# Patient Record
Sex: Male | Born: 1980 | Race: Black or African American | Hispanic: No | Marital: Single | Smoking: Current every day smoker
Health system: Southern US, Community
[De-identification: ages and names within clinical notes are randomized; demographics above are authoritative.]

## PROBLEM LIST (undated history)

## (undated) DIAGNOSIS — S069X9A Unspecified intracranial injury with loss of consciousness of unspecified duration, initial encounter: Secondary | ICD-10-CM

## (undated) DIAGNOSIS — R569 Unspecified convulsions: Secondary | ICD-10-CM

## (undated) DIAGNOSIS — S069XAA Unspecified intracranial injury with loss of consciousness status unknown, initial encounter: Secondary | ICD-10-CM

## (undated) DIAGNOSIS — I10 Essential (primary) hypertension: Secondary | ICD-10-CM

## (undated) DIAGNOSIS — F17200 Nicotine dependence, unspecified, uncomplicated: Secondary | ICD-10-CM

## (undated) HISTORY — DX: Unspecified intracranial injury with loss of consciousness status unknown, initial encounter: S06.9XAA

## (undated) HISTORY — DX: Nicotine dependence, unspecified, uncomplicated: F17.200

## (undated) HISTORY — DX: Unspecified intracranial injury with loss of consciousness of unspecified duration, initial encounter: S06.9X9A

## (undated) HISTORY — PX: OTHER SURGICAL HISTORY: SHX169

## (undated) HISTORY — DX: Unspecified convulsions: R56.9

---
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
## 2003-12-01 ENCOUNTER — Emergency Department (HOSPITAL_COMMUNITY): Admission: EM | Admit: 2003-12-01 | Discharge: 2003-12-01 | Payer: Self-pay

## 2004-12-07 ENCOUNTER — Emergency Department (HOSPITAL_COMMUNITY): Admission: EM | Admit: 2004-12-07 | Discharge: 2004-12-07 | Payer: Self-pay | Admitting: Emergency Medicine

## 2006-06-16 ENCOUNTER — Emergency Department (HOSPITAL_COMMUNITY): Admission: EM | Admit: 2006-06-16 | Discharge: 2006-06-16 | Payer: Self-pay | Admitting: Emergency Medicine

## 2006-08-03 ENCOUNTER — Emergency Department (HOSPITAL_COMMUNITY): Admission: EM | Admit: 2006-08-03 | Discharge: 2006-08-04 | Payer: Self-pay | Admitting: Emergency Medicine

## 2006-10-18 ENCOUNTER — Emergency Department (HOSPITAL_COMMUNITY): Admission: EM | Admit: 2006-10-18 | Discharge: 2006-10-18 | Payer: Self-pay | Admitting: Family Medicine

## 2007-07-28 ENCOUNTER — Emergency Department (HOSPITAL_COMMUNITY): Admission: EM | Admit: 2007-07-28 | Discharge: 2007-07-28 | Payer: Self-pay | Admitting: Family Medicine

## 2007-08-02 ENCOUNTER — Ambulatory Visit (HOSPITAL_COMMUNITY): Admission: RE | Admit: 2007-08-02 | Discharge: 2007-08-02 | Payer: Self-pay | Admitting: Orthopedic Surgery

## 2007-11-23 ENCOUNTER — Emergency Department (HOSPITAL_COMMUNITY): Admission: EM | Admit: 2007-11-23 | Discharge: 2007-11-23 | Payer: Self-pay | Admitting: Family Medicine

## 2010-01-01 ENCOUNTER — Emergency Department (HOSPITAL_COMMUNITY): Admission: EM | Admit: 2010-01-01 | Discharge: 2010-01-01 | Payer: Self-pay | Admitting: Emergency Medicine

## 2010-09-18 LAB — POCT URINALYSIS DIP (DEVICE)
Bilirubin Urine: NEGATIVE
Glucose, UA: NEGATIVE mg/dL
Hgb urine dipstick: NEGATIVE
Ketones, ur: NEGATIVE mg/dL
Nitrite: NEGATIVE
Protein, ur: NEGATIVE mg/dL
Specific Gravity, Urine: 1.025 (ref 1.005–1.030)
Urobilinogen, UA: 0.2 mg/dL (ref 0.0–1.0)
pH: 6 (ref 5.0–8.0)

## 2011-12-03 ENCOUNTER — Encounter (HOSPITAL_COMMUNITY): Payer: Self-pay | Admitting: Emergency Medicine

## 2011-12-03 ENCOUNTER — Emergency Department (HOSPITAL_COMMUNITY)
Admission: EM | Admit: 2011-12-03 | Discharge: 2011-12-03 | Disposition: A | Discharge status: Home / Self Care | Payer: Self-pay | Source: Home / Self Care | Attending: Family Medicine | Admitting: Family Medicine

## 2011-12-03 DIAGNOSIS — I10 Essential (primary) hypertension: Secondary | ICD-10-CM

## 2011-12-03 HISTORY — DX: Essential (primary) hypertension: I10

## 2011-12-03 LAB — POCT URINALYSIS DIP (DEVICE)
Glucose, UA: NEGATIVE mg/dL
Nitrite: NEGATIVE
Urobilinogen, UA: 0.2 mg/dL (ref 0.0–1.0)

## 2011-12-03 LAB — POCT I-STAT, CHEM 8
HCT: 41 % (ref 39.0–52.0)
Hemoglobin: 13.9 g/dL (ref 13.0–17.0)
Potassium: 4.2 mEq/L (ref 3.5–5.1)
Sodium: 138 mEq/L (ref 135–145)

## 2011-12-03 MED ORDER — TRIAMTERENE-HCTZ 37.5-25 MG PO TABS: 1.0000 | ORAL_TABLET | Freq: Every day | ORAL | Status: DC

## 2011-12-03 NOTE — Discharge Instructions (Signed)
You need to find a primary care provider who can monitor your blood pressure, adjust her medication accordingly and refill your medications. See the list below for community resources. Take the prescribed medication as instructed. You will need to have your blood pressure rechecked in 2 weeks. Go to the emergency department if persistent worsening headache or any new symptoms like chest pain, face drop, and difficulty moving your arms or legs, making or understanding speech, problems with gait, balance or vision.  Go to www.goodrx.com to look up your medications. This will give you a list of where you can find your prescriptions at the most affordable prices.   Call [facility]  [phone]  If you have no primary doctor, here are some resources that may be helpful:  Medicaid-accepting [facility] Providers:   - [facility]- [address]      [phone]      Mon-Fri 9am-7pm, Sat 9am-1pm   - [facility]- [address] [place]      [phone]   - [facility]- [address], [address] [place]      [phone]   [facility]- [address]      [phone]   - Renaye Rakers- [address] [place], [address]      [phone]      Only accepts [place] patients       after they have her name applied to their card   Self Pay (no insurance) in [place]:   - Sickle Cell Patients: Dr Willey Blade, [facility]      [address] [place]      [phone]   - [facility] [phone]   - Physician Referral Service- [phone]   - [facility]- [address] [place]      [phone]   [facility] [place]- [address]   - [facility]- see information above      (Speak to [employer] if you do not have insurance)   - [facility]- [address] [place]      [phone]   - [facility] [place]- [address]      [phone]   -  [facility]- [address]      [phone]   - Dr Julio Sicks-  [address], [address], [place]      [phone]   - [facility]- [address]      [phone]   - [facility]- [address]      [phone]      Also [address]      [phone]   - [facility]- [address]      [phone]      1st and 3rd Saturday every month, 10am-1pm    Other agencies that provide inexpensive medical care:     [facility]  [phone]    [facility]  [phone]    [facility]  [phone]    [facility]  [phone] [address] [place] [address]    [facility]  [phone]    [facility]  [phone] [facility] [phone]   [address] [place], [place] [address]  Chronic Pain Problems Contact  [facility]  [phone] Patients need to be referred by their primary care doctor.  [facility]  [facility]     [employer]                          [facility]. [address] [place]                       [address]      [address] [place] [address]   [phone] (After Hours)  General Information: Finding a doctor when you do not have health insurance can be tricky. Although you are not limited by an insurance plan, you are of course limited by her finances and how much but he can pay out of pocket.  What are your options if you don't have health insurance?   1) Find a Librarian, academic and Pay Out of Pocket Although you won't have to find out who is covered by your insurance plan, it is a good idea to ask around and get recommendations. You will then need to call the office and see if the doctor you have chosen will accept you as a new patient and what types of options they offer for patients who are self-pay. Some doctors offer discounts or will set up  payment plans for their patients who do not have insurance, but you will need to ask so you aren't surprised when you get to your appointment.  2) Contact Your Local Health Department Not all health departments have doctors that can see patients for sick visits, but many do, so it is worth a call to see if yours does. If you don't know where your local health department is, you can check in your phone book. The CDC also has a tool to help you locate your state's health department, and many state websites also have listings of all of their local health departments.  3) Find a Walk-in Clinic If your illness is not likely to be very severe or complicated, you may want to try a walk in clinic. These are popping up all over the country in pharmacies, drugstores, and shopping centers. They're usually staffed by nurse practitioners or physician assistants that have been trained to treat common illnesses and complaints. They're usually fairly quick and inexpensive. However, if you have serious medical issues or chronic medical problems, these are probably not your best option     [facility]: [address] [place] [address] [phone]  [facility]: [address] [place] [address]   [phone]  [facility]: [address] [place] [address] [phone]  [facility] : [address] [place] [address] [phone]  [facility]: [address] [place] [address] [phone]  [facility]: [address] [place] [address] [phone]  Dr. Oneal Grout [address] [facility] [place] [address] [phone]

## 2011-12-03 NOTE — ED Notes (Signed)
Intermittent headache for four days.  Denies uri symptoms.  Reports in jail may 1 - may 31.  While there, they diagnosed htn and started patient on blood pressure medicine.  Once out of jail, no longer had medication.  Patient not sure what med he took in jail-diuretic.

## 2011-12-03 NOTE — ED Provider Notes (Signed)
History     CSN: [id number]  Arrival date & time 12/03/11  1140   First MD Initiated Contact with Patient 12/03/11 1152      Chief Complaint  Patient presents with  . Headache    (Consider location/radiation/quality/duration/timing/severity/associated sxs/prior treatment) HPI Comments: 31 year old smoker male with history of high blood pressure diagnosed while he was in jail last month. Comes complaining of intermittent headaches for 3 days. Reports he's been out of his blood pressure medication for the last 3 days. Denies visual changes. Denies gait or balance problems. No weakness, numbness, or paresthesias. Denies chest pain or leg swelling. No PND. No hematuria. No abdominal pain. No nausea or vomiting. No fever or chills.   Past Medical History  Diagnosis Date  . Hypertension   . Asthma     History reviewed. No pertinent past surgical history.  Family History  Problem Relation Age of Onset  . Hypertension Mother   . Hypertension Other     History  Substance Use Topics  . Smoking status: Current Everyday Smoker  . Smokeless tobacco: Not on file  . Alcohol Use: Yes      Review of Systems  Constitutional: Negative for fever, chills, diaphoresis and fatigue.  Eyes: Negative for visual disturbance.  Respiratory: Negative for cough and shortness of breath.   Cardiovascular: Negative for chest pain, palpitations and leg swelling.  Gastrointestinal: Negative for nausea, vomiting, abdominal pain and diarrhea.  Genitourinary: Negative for dysuria, urgency, frequency, hematuria, flank pain and discharge.  Musculoskeletal: Negative for back pain, joint swelling and arthralgias.  Neurological: Positive for headaches. Negative for tremors, seizures, syncope, speech difficulty, weakness and numbness.    Allergies  Review of patient's allergies indicates no known allergies.  Home Medications   Current Outpatient Rx  Name Route Sig Dispense Refill  . TRIAMTERENE-HCTZ  37.5-25 MG PO TABS Oral Take 1 each (1 tablet total) by mouth daily. 30 tablet 1    BP 167/102  Pulse 60  Temp(Src) 98.8 F (37.1 C) (Oral)  Resp 18  SpO2 100%  Physical Exam  Constitutional: He is oriented to person, place, and time. He appears well-developed and well-nourished. No distress.  Cardiovascular: Normal rate, regular rhythm and normal heart sounds.  Exam reveals no gallop and no friction rub.   No murmur heard. Pulmonary/Chest: Effort normal and breath sounds normal. No respiratory distress. He has no wheezes. He has no rales. He exhibits no tenderness.  Abdominal: Soft. Bowel sounds are normal. He exhibits no distension and no mass. There is no tenderness.  Neurological: He is alert and oriented to person, place, and time.  Skin: No rash noted.       Skin of scalp left parietal soft thick redundant. 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 Hair is breaded to cover skin deformity.  Hair breads look tight and causing traction. No skin erythema or brakes.    ED Course  Procedures (including critical care time)  Labs Reviewed  POCT URINALYSIS DIP (DEVICE) - Abnormal; Notable for the following:    Leukocytes, UA TRACE (*) Biochemical Testing Only. Please order routine urinalysis from main lab if confirmatory testing is needed.   All other components within normal limits   No results found.   1. Hypertension       MDM  31 year old male with history of hypertension of his medications here with intermittent headache. Does not remember the name of his blood pressure medication. No protein or blood in poc urine.  Prescribed triamterene to take 1/2 tab daily until finding a PCP. Reccommended to have BP rechecked in 1-2 weeks.        Sharin Grave, MD 12/04/11 [phone]

## 2014-05-31 ENCOUNTER — Encounter (HOSPITAL_COMMUNITY): Payer: Self-pay

## 2014-05-31 ENCOUNTER — Emergency Department (HOSPITAL_COMMUNITY): Payer: No Typology Code available for payment source

## 2014-05-31 ENCOUNTER — Emergency Department (HOSPITAL_COMMUNITY)
Admission: EM | Admit: 2014-05-31 | Discharge: 2014-05-31 | Disposition: A | Discharge status: Home / Self Care | Payer: No Typology Code available for payment source | Attending: Emergency Medicine | Admitting: Emergency Medicine

## 2014-05-31 DIAGNOSIS — Z79899 Other long term (current) drug therapy: Secondary | ICD-10-CM | POA: Insufficient documentation

## 2014-05-31 DIAGNOSIS — Y92410 Unspecified street and highway as the place of occurrence of the external cause: Secondary | ICD-10-CM | POA: Insufficient documentation

## 2014-05-31 DIAGNOSIS — S8992XA Unspecified injury of left lower leg, initial encounter: Secondary | ICD-10-CM | POA: Insufficient documentation

## 2014-05-31 DIAGNOSIS — J45909 Unspecified asthma, uncomplicated: Secondary | ICD-10-CM | POA: Insufficient documentation

## 2014-05-31 DIAGNOSIS — Y9389 Activity, other specified: Secondary | ICD-10-CM | POA: Insufficient documentation

## 2014-05-31 DIAGNOSIS — M25562 Pain in left knee: Secondary | ICD-10-CM

## 2014-05-31 DIAGNOSIS — Y998 Other external cause status: Secondary | ICD-10-CM | POA: Diagnosis not present

## 2014-05-31 DIAGNOSIS — Z720 Tobacco use: Secondary | ICD-10-CM | POA: Diagnosis not present

## 2014-05-31 DIAGNOSIS — S3992XA Unspecified injury of lower back, initial encounter: Secondary | ICD-10-CM | POA: Insufficient documentation

## 2014-05-31 DIAGNOSIS — I10 Essential (primary) hypertension: Secondary | ICD-10-CM | POA: Diagnosis not present

## 2014-05-31 DIAGNOSIS — R52 Pain, unspecified: Secondary | ICD-10-CM

## 2014-05-31 MED ORDER — METHOCARBAMOL 500 MG PO TABS
500.0000 mg | ORAL_TABLET | Freq: Two times a day (BID) | ORAL | Status: DC
Start: 2014-05-31 — End: 2016-09-19

## 2014-05-31 MED ORDER — IBUPROFEN 800 MG PO TABS: 800.0000 mg | ORAL_TABLET | Freq: Three times a day (TID) | ORAL | Status: DC
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
## 2014-05-31 NOTE — Discharge Instructions (Signed)
1. Medications: robaxin, ibuprofen, usual home medications 2. Treatment: rest, drink plenty of fluids, gentle stretching as discussed, alternate ice and heat 3. Follow Up: Please followup with your primary doctor in 3 days for discussion of your diagnoses and further evaluation after today's visit; if you do not have a primary care doctor use the resource guide provided to find one;  Return to the ER for worsening back pain, difficulty walking, loss of bowel or bladder control or other concerning symptoms    Arthralgia Your caregiver has diagnosed you as suffering from an arthralgia. Arthralgia means there is pain in a joint. This can come from many reasons including:  Bruising the joint which causes soreness (inflammation) in the joint.  Wear and tear on the joints which occur as we grow older (osteoarthritis).  Overusing the joint.  Various forms of arthritis.  Infections of the joint. Regardless of the cause of pain in your joint, most of these different pains respond to anti-inflammatory drugs and rest. The exception to this is when a joint is infected, and these cases are treated with antibiotics, if it is a bacterial infection. HOME CARE INSTRUCTIONS   Rest the injured area for as long as directed by your caregiver. Then slowly start using the joint as directed by your caregiver and as the pain allows. Crutches as directed may be useful if the ankles, knees or hips are involved. If the knee was splinted or casted, continue use and care as directed. If an stretchy or elastic wrapping bandage has been applied today, it should be removed and re-applied every 3 to 4 hours. It should not be applied tightly, but firmly enough to keep swelling down. Watch toes and feet for swelling, bluish discoloration, coldness, numbness or excessive pain. If any of these problems (symptoms) occur, remove the ace bandage and re-apply more loosely. If these symptoms persist, contact your caregiver or return to  this location.  For the first 24 hours, keep the injured extremity elevated on pillows while lying down.  Apply ice for 15-20 minutes to the sore joint every couple hours while awake for the first half day. Then 03-04 times per day for the first 48 hours. Put the ice in a plastic bag and place a towel between the bag of ice and your skin.  Wear any splinting, casting, elastic bandage applications, or slings as instructed.  Only take over-the-counter or prescription medicines for pain, discomfort, or fever as directed by your caregiver. Do not use aspirin immediately after the injury unless instructed by your physician. Aspirin can cause increased bleeding and bruising of the tissues.  If you were given crutches, continue to use them as instructed and do not resume weight bearing on the sore joint until instructed. Persistent pain and inability to use the sore joint as directed for more than 2 to 3 days are warning signs indicating that you should see a caregiver for a follow-up visit as soon as possible. Initially, a hairline fracture (break in bone) may not be evident on X-rays. Persistent pain and swelling indicate that further evaluation, non-weight bearing or use of the joint (use of crutches or slings as instructed), or further X-rays are indicated. X-rays may sometimes not show a small fracture until a week or 10 days later. Make a follow-up appointment with your own caregiver or one to whom we have referred you. A radiologist (specialist in reading X-rays) may read your X-rays. Make sure you know how you are to obtain your X-ray results.  Do not assume everything is normal if you do not hear from us. SEEK MEDICAL CARE IF: Bruising, swelling, or pain increases. SEEK IMMEDIATE MEDICAL CARE IF:   Your fingers or toes are numb or blue.  The pain is not responding to medications and continues to stay the same or get worse.  The pain in your joint becomes severe.  You develop a fever over 102  F (38.9 C).  It becomes impossible to move or use the joint. MAKE SURE YOU:   Understand these instructions.  Will watch your condition.  Will get help right away if you are not doing well or get worse. Document Released: 06/19/2005 Document Revised: 09/11/2011 Document Reviewed: 02/05/2008 [facility] Patient Information 2015 [employer] [place]. This information is not intended to replace advice given to you by your health care provider. Make sure you discuss any questions you have with your health care provider.
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
## 2015-10-29 ENCOUNTER — Encounter (HOSPITAL_COMMUNITY): Payer: Self-pay | Admitting: Emergency Medicine

## 2015-10-29 ENCOUNTER — Emergency Department (HOSPITAL_COMMUNITY)
Admission: EM | Admit: 2015-10-29 | Discharge: 2015-10-29 | Disposition: A | Discharge status: Home / Self Care | Payer: Self-pay | Attending: Emergency Medicine | Admitting: Emergency Medicine

## 2015-10-29 ENCOUNTER — Emergency Department (HOSPITAL_COMMUNITY): Payer: No Typology Code available for payment source

## 2015-10-29 DIAGNOSIS — F17200 Nicotine dependence, unspecified, uncomplicated: Secondary | ICD-10-CM | POA: Insufficient documentation

## 2015-10-29 DIAGNOSIS — J209 Acute bronchitis, unspecified: Secondary | ICD-10-CM | POA: Insufficient documentation

## 2015-10-29 DIAGNOSIS — I10 Essential (primary) hypertension: Secondary | ICD-10-CM | POA: Insufficient documentation

## 2015-10-29 DIAGNOSIS — J40 Bronchitis, not specified as acute or chronic: Secondary | ICD-10-CM

## 2015-10-29 DIAGNOSIS — J45901 Unspecified asthma with (acute) exacerbation: Secondary | ICD-10-CM | POA: Insufficient documentation

## 2015-10-29 DIAGNOSIS — Z79899 Other long term (current) drug therapy: Secondary | ICD-10-CM | POA: Insufficient documentation

## 2015-10-29 LAB — CBC WITH DIFFERENTIAL/PLATELET
Basophils Absolute: 0 10*3/uL (ref 0.0–0.1)
Basophils Relative: 0 %
EOS ABS: 0.3 10*3/uL (ref 0.0–0.7)
Eosinophils Relative: 3 %
HCT: 43.6 % (ref 39.0–52.0)
HEMOGLOBIN: 14.8 g/dL (ref 13.0–17.0)
LYMPHS ABS: 2.1 10*3/uL (ref 0.7–4.0)
LYMPHS PCT: 25 %
MCH: 32.7 pg (ref 26.0–34.0)
MCHC: 33.9 g/dL (ref 30.0–36.0)
MCV: 96.2 fL (ref 78.0–100.0)
Monocytes Absolute: 0.8 10*3/uL (ref 0.1–1.0)
Monocytes Relative: 10 %
NEUTROS PCT: 62 %
Neutro Abs: 5.3 10*3/uL (ref 1.7–7.7)
Platelets: 239 10*3/uL (ref 150–400)
RBC: 4.53 MIL/uL (ref 4.22–5.81)
RDW: 12.3 % (ref 11.5–15.5)
WBC: 8.5 10*3/uL (ref 4.0–10.5)

## 2015-10-29 LAB — BASIC METABOLIC PANEL
Anion gap: 13 (ref 5–15)
BUN: 11 mg/dL (ref 6–20)
CHLORIDE: 104 mmol/L (ref 101–111)
CO2: 22 mmol/L (ref 22–32)
Calcium: 9.4 mg/dL (ref 8.9–10.3)
Creatinine, Ser: 1.43 mg/dL — ABNORMAL HIGH (ref 0.61–1.24)
GFR calc non Af Amer: >60 mL/min (ref >60)
Glucose, Bld: 166 mg/dL — ABNORMAL HIGH (ref 65–99)
POTASSIUM: 3.1 mmol/L — AB (ref 3.5–5.1)
SODIUM: 139 mmol/L (ref 135–145)

## 2015-10-29 MED ORDER — IPRATROPIUM-ALBUTEROL 0.5-2.5 (3) MG/3ML IN SOLN
3.0000 mL | Freq: Once | RESPIRATORY_TRACT | Status: AC
  Administered 2015-10-29: 3 mL via RESPIRATORY_TRACT
  Filled 2015-10-29: qty 3

## 2015-10-29 MED ORDER — AZITHROMYCIN 250 MG PO TABS
250.0000 mg | ORAL_TABLET | Freq: Every day | ORAL | Status: DC
Start: 2015-10-29 — End: 2016-09-19

## 2015-10-29 MED ORDER — SODIUM CHLORIDE 0.9 % IV BOLUS (SEPSIS)
1000.0000 mL | Freq: Once | INTRAVENOUS | Status: AC
  Administered 2015-10-29: 1000 mL via INTRAVENOUS

## 2015-10-29 MED ORDER — ALBUTEROL SULFATE HFA 108 (90 BASE) MCG/ACT IN AERS: 2.0000 | INHALATION_SPRAY | RESPIRATORY_TRACT | Status: DC | PRN

## 2015-10-29 MED ORDER — ALBUTEROL SULFATE (2.5 MG/3ML) 0.083% IN NEBU
5.0000 mg | INHALATION_SOLUTION | Freq: Once | RESPIRATORY_TRACT | Status: AC
  Administered 2015-10-29: 5 mg via RESPIRATORY_TRACT

## 2015-10-29 MED ORDER — METHYLPREDNISOLONE SODIUM SUCC 125 MG IJ SOLR
125.0000 mg | Freq: Once | INTRAMUSCULAR | Status: AC
  Administered 2015-10-29: 125 mg via INTRAVENOUS
  Filled 2015-10-29: qty 2

## 2015-10-29 MED ORDER — ALBUTEROL SULFATE (2.5 MG/3ML) 0.083% IN NEBU
INHALATION_SOLUTION | RESPIRATORY_TRACT | Status: AC
  Filled 2015-10-29: qty 6

## 2015-10-29 MED ORDER — BENZONATATE 100 MG PO CAPS: 100.0000 mg | ORAL_CAPSULE | Freq: Three times a day (TID) | ORAL | Status: DC

## 2015-10-29 MED ORDER — ALBUTEROL SULFATE HFA 108 (90 BASE) MCG/ACT IN AERS
2.0000 | INHALATION_SPRAY | Freq: Once | RESPIRATORY_TRACT | Status: AC
  Administered 2015-10-29: 2 via RESPIRATORY_TRACT
  Filled 2015-10-29: qty 6.7

## 2015-10-29 NOTE — Discharge Instructions (Signed)
You have been seen today for an asthma exacerbation. Your lab tests showed no abnormalities. Your x-ray shows evidence of bronchitis, which is commonly a virus and does not need antibiotics, however, due to her asthma history and the duration of symptoms, antibiotics are indicated. Please take all of your antibiotics until finished! You may develop abdominal discomfort or diarrhea from the antibiotic.  You may help offset this with probiotics which you can buy or get in yogurt. Do not eat or take the probiotics until 2 hours after your antibiotic. The x-ray also showed a small nodule in the bottom of your left lung. You should select and follow up with a primary care provider who you can see regularly. The [facility] and [facility] can set you up with a PCP. You are highly encouraged to stop smoking. Return to ED should symptoms worsen.  RESOURCE GUIDE  Chronic Pain Problems: Contact [facility]  [phone] Patients need to be referred by their primary care doctor.  Insufficient Money for Medicine: Contact United Way:  call "211" or [facility] [phone].  No Primary Care Doctor: - Call Health Connect  [phone] - can help you locate a primary care doctor that  accepts your insurance, provides certain services, etc. - Physician Referral Service- [phone]  Agencies that provide inexpensive medical care: - [facility]  [phone] - [facility]  [phone] - [facility]  [phone] - Women's Clinic  [phone] - Planned Parenthood  [phone] [facility]  [phone]  Medicaid-accepting [facility] Providers: - [facility]- [address]  [phone], Mon-Fri 9am-7pm, Sat 9am-1pm - [facility]- [address] [place], [address] [place]  [phone] - [facility]- [address] [place]  [phone] [facility]  [facility]- [address]  [phone] - Renaye Rakers- [address] [place], [address], [phone]  Only accepts [place] patients after they have their name  applied to their card  Self Pay (no insurance) in [place]: - Sickle Cell Patients: Dr Willey Blade, [facility]  [address] [place], [phone] - [facility]- [address] [place]  [phone]       [facility] [place]- [address]       -     [facility]- see information above (Speak to [employer] if you do not have insurance)       -  [facility]- [address] [place], [phone]       -  [facility] [place] [facility]- [address],  [phone]       -  [facility]- [address], [phone]       -  Dr Julio Sicks-  [address], [place], [phone]       -  [facility]- [address], [phone]       -  [facility]- [address] [phone], also [address], [phone]       -    [facility]- [address] [place], [phone], 1st & 3rd Saturday   every month, 10am-1pm  1) Find a Doctor and Pay Out of Pocket Although you won't have to find out who is covered by your insurance plan, it  is a good idea to ask around and get recommendations. You will then need to call the office and see if the doctor you have chosen will accept you as a new patient and what types of options they offer for patients who are self-pay. Some doctors offer discounts or will set up payment plans for their patients who do not have insurance, but you will need to ask so you aren't surprised when you get to your appointment.  2) Contact Your Local Health Department Not all health departments have doctors that can see patients for sick visits, but many do, so it is worth a call to see if yours does. If you don't know where your local health department is, you can check in your phone book. The CDC also has a tool to  help you locate your state's health department, and many state websites also have listings of all of their local health departments.  3) Find a Walk-in Clinic If your illness is not likely to be very severe or complicated, you may want to try a walk in clinic. These are popping up all over the country in pharmacies, drugstores, and shopping centers. They're usually staffed by nurse practitioners or physician assistants that have been trained to treat common illnesses and complaints. They're usually fairly quick and inexpensive. However, if you have serious medical issues or chronic medical problems, these are probably not your best option  STD Testing - [facility] [place], STD Clinic, [address], [place], phone [phone] or [phone].  Monday - Friday, call for an appointment. [facility]- [place] [facility] of [facility] [place], STD Clinic, [place], [place], phone [phone] or [phone].  Monday - Friday, call for an appointment.  Abuse/Neglect: [facility]- [place] Child Abuse Hotline (669)815-9543(336) 431-878-2487 [facility]- [place] Child Abuse Hotline 936-506-13168073711068 (After Hours)  Emergency Shelter:  [facility] [phone](336) [phone]  Maternity Homes: - [facility] [phone](336) [phone] - [facility] [phone]  MRSA Hotline #:   219-265-9090505-347-1638  [facility] Resources  [facility]  United Way [facility] [address] [place].                 [address]         [address]  [place]                                               [place]                              [place] Phone:  [phone]                                  Phone:  [phone]                   Phone:  [phone]  [facility], [phone] - [facility] Services - [phone]- [phone]       -     [facility] in [place], [address],  [phone], [place] [phone] or [phone] (After Hours)   [place]  Substance Abuse Resources: - Alcohol and Drug Services  [phone] - [place] [phone] - The [facility] [place] [phone] - Residential & Outpatient Substance Abuse Program  [phone]  Psychological Services: - [place], [place] [address], [place], [place]: [phone] or [phone], [employer].uy  Dental Assistance  If unable to pay or uninsured, contact:  [facility] or [facility]. to become qualified for the adult dental clinic.  Patients with Medicaid: [facility] [phone] [address], [place] [address] [phone]  If unable to pay, or uninsured, contact [facility] [phone]) or [facility] [phone] in [place], [place] in [facility]) to become qualified for the adult dental clinic   Other [place] [place], [place], [place], [address], [place], [place], 2nd and 4th Thursday of the month at 6:30am.  10 clients each day by appointment, can sometimes see walk-in patients if someone does not show for an appointment. [facility]- [address] [place], [place], [address], [place], [place], [place], [address], [place] Department- [place] Department- [place] Department- [phone]

## 2015-10-29 NOTE — ED Notes (Signed)
Pt verbalized understanding of d/c instructions, prescriptions, and follow-up care. No further questions/concerns, VSS, ambulatory w/ steady gait (refused wheelchair) 

## 2015-10-29 NOTE — ED Notes (Signed)
Per pts family he has been congested and having cough like symptoms for the last week today had worsening congestion and difficultly breathing. Pt has history of asthma. pts lung sounds- expiratory wheezing throughout. Pt is diaphoretic.

## 2015-10-29 NOTE — ED Provider Notes (Signed)
CSN: [id number]     Arrival date & time 10/29/15  1413 History   First MD Initiated Contact with Patient 10/29/15 1531     Chief Complaint  Patient presents with  . Asthma     (Consider location/radiation/quality/duration/timing/severity/associated sxs/prior Treatment) HPI   [name] is a 35 y.o. male, with a history of asthma and hypertension, presenting to the ED with Productive cough with clear sputum and congestion for the last 2 weeks. Patient states that he came to the ED because while he was at work today he began to have an episode of shortness of breath. His home albuterol inhaler improved his breathing somewhat, but patient still wanted to get checked out. Patient is an active smoker, smoking 1-1/2 to 2 packs per day. Patient denies fever/chills, nausea/vomiting, chest pain, current shortness of breath, or any other complaints.    Past Medical History  Diagnosis Date  . Hypertension   . Asthma    History reviewed. No pertinent past surgical history. Family History  Problem Relation Age of Onset  . Hypertension Mother   . Hypertension Other    Social History  Substance Use Topics  . Smoking status: Current Every Day Smoker  . Smokeless tobacco: None  . Alcohol Use: Yes    Review of Systems  Constitutional: Negative for fever and chills.  HENT: Positive for congestion.   Respiratory: Positive for cough and shortness of breath (resolved).   Cardiovascular: Negative for chest pain.  Gastrointestinal: Negative for nausea and vomiting.  Neurological: Negative for headaches.  All other systems reviewed and are negative.     Allergies  Review of patient's allergies indicates no known allergies.  Home Medications   Prior to Admission medications   Medication Sig Start Date End Date Taking? Authorizing Provider  albuterol (PROVENTIL HFA;VENTOLIN HFA) 108 (90 Base) MCG/ACT inhaler Inhale 1 puff into the lungs every 6 (six) hours as needed for wheezing or  shortness of breath.   Yes Historical Provider, MD  guaiFENesin (MUCINEX) 600 MG 12 hr tablet Take 600 mg by mouth 2 (two) times daily as needed for to loosen phlegm.   Yes Historical Provider, MD  ibuprofen (ADVIL,MOTRIN) 200 MG tablet Take 200 mg by mouth every 6 (six) hours as needed.   Yes Historical Provider, MD  albuterol (PROVENTIL HFA;VENTOLIN HFA) 108 (90 Base) MCG/ACT inhaler Inhale 2 puffs into the lungs every 4 (four) hours as needed for wheezing or shortness of breath. 10/29/15   Shawn C Joy, PA-C  azithromycin (ZITHROMAX) 250 MG tablet Take 1 tablet (250 mg total) by mouth daily. Take first 2 tablets together, then 1 every day until finished. 10/29/15   Shawn C Joy, PA-C  benzonatate (TESSALON) 100 MG capsule Take 1 capsule (100 mg total) by mouth every 8 (eight) hours. 10/29/15   Shawn C Joy, PA-C  ibuprofen (ADVIL,MOTRIN) 800 MG tablet Take 1 tablet (800 mg total) by mouth 3 (three) times daily. Patient not taking: Reported on 10/29/2015 05/31/14   Dahlia ClientHannah Muthersbaugh, PA-C  methocarbamol (ROBAXIN) 500 MG tablet Take 1 tablet (500 mg total) by mouth 2 (two) times daily. Patient not taking: Reported on 10/29/2015 05/31/14   Dahlia ClientHannah Muthersbaugh, PA-C  triamterene-hydrochlorothiazide (MAXZIDE-25) 37.5-25 MG per tablet Take 1 each (1 tablet total) by mouth daily. Patient not taking: Reported on 05/31/2014 12/03/11 12/02/12  Adlih Moreno-Coll, MD   BP 151/85 mmHg  Pulse 70  Temp(Src) 98 F (36.7 C) (Oral)  Resp 20  SpO2 96% Physical Exam  Constitutional:  He appears well-developed and well-nourished. No distress.  HENT:  Head: Normocephalic and atraumatic.  Eyes: Conjunctivae are normal. Pupils are equal, round, and reactive to light.  Neck: Neck supple.  Cardiovascular: Normal rate, regular rhythm, normal heart sounds and intact distal pulses.   Pulmonary/Chest: Effort normal. No respiratory distress. He has wheezes in the right middle field, the right lower field, the left upper field,  the left middle field and the left lower field.  Abdominal: Soft. There is no tenderness. There is no guarding.  Musculoskeletal: He exhibits no edema or tenderness.  Lymphadenopathy:    He has no cervical adenopathy.  Neurological: He is alert.  Skin: Skin is warm and dry. He is not diaphoretic.  Psychiatric: He has a normal mood and affect. His behavior is normal.  Nursing note and vitals reviewed.   ED Course  Procedures (including critical care time) Labs Review Labs Reviewed  BASIC METABOLIC PANEL - Abnormal; Notable for the following:    Potassium 3.1 (*)    Glucose, Bld 166 (*)    Creatinine, Ser 1.43 (*)    All other components within normal limits  CBC WITH DIFFERENTIAL/PLATELET    Imaging Review Dg Chest 2 View  10/29/2015  CLINICAL DATA:  Shortness of breath and dizziness today. Cough, chest congestion and sneezing for about 1.5 weeks. EXAM: CHEST  2 VIEW COMPARISON:  08/04/2006 FINDINGS: Heart size and pulmonary vascularity are normal. There is prominent peribronchial thickening on the right. 6 mm nodular appearing density at the left lung base may represent a confluence of normal vessels or an overlying nipple shadow although I cannot exclude small pulmonary nodule. No effusions.  No bone abnormality. IMPRESSION: Bronchitic changes. 6 mm nodular density at the left base is nonspecific. It was not visible on the prior exam. I do not recommend follow-up or further evaluation at this time. Electronically Signed   By: Francene Boyers M.D.   On: 10/29/2015 14:58   I have personally reviewed and evaluated these images and lab results as part of my medical decision-making.   EKG Interpretation None      Medications  albuterol (PROVENTIL) (2.5 MG/3ML) 0.083% nebulizer solution (  Canceled Entry 10/29/15 1516)  albuterol (PROVENTIL) (2.5 MG/3ML) 0.083% nebulizer solution 5 mg (5 mg Nebulization Given 10/29/15 1425)  sodium chloride 0.9 % bolus 1,000 mL (0 mLs Intravenous Stopped  10/29/15 1710)  ipratropium-albuterol (DUONEB) 0.5-2.5 (3) MG/3ML nebulizer solution 3 mL (3 mLs Nebulization Given 10/29/15 1609)  methylPREDNISolone sodium succinate (SOLU-MEDROL) 125 mg/2 mL injection 125 mg (125 mg Intravenous Given 10/29/15 1609)  albuterol (PROVENTIL HFA;VENTOLIN HFA) 108 (90 Base) MCG/ACT inhaler 2 puff (2 puffs Inhalation Given 10/29/15 1640)    MDM   Final diagnoses:  Asthma, unspecified asthma severity, with acute exacerbation  Bronchitis    [name] presents with asthma exacerbation that occurred earlier today combined with a cough for the last 2 weeks.  This patient's presentation is consistent with an asthma exacerbation caused by bronchitis. Due to the patient's history of asthma and the duration of symptoms, antibiotics are warranted. No infiltrate noted on x-ray. Patient is nontoxic appearing, afebrile, not tachycardic, not tachypneic, maintains SPO2 of 97-98% on room air, and is in no apparent distress. Patient has no signs of sepsis or other serious or life-threatening condition. The tachycardia noted on the patient's recorded vital signs was reassessed and noted to be unsustained. The increase in the patient's pulse is likely due to the albuterol treatment. The nodule noted  on the patient's x-ray was communicated with him. Patient was encouraged to find a PCP and quit smoking. Resources given. Home care and return precautions discussed. Patient voiced understanding of these instructions and is comfortable with discharge.  Filed Vitals:   10/29/15 1416 10/29/15 1600 10/29/15 1630 10/29/15 1700  BP: 162/103 141/95 150/93 151/85  Pulse: 99 105 108 70  Temp: 98 F (36.7 C)     TempSrc: Oral     Resp: SpO2: 97% 98% 97% 96%       Anselm Pancoast, PA-C 10/29/15 1944  Pricilla Loveless, MD 10/30/15 1557

## 2016-07-08 ENCOUNTER — Inpatient Hospital Stay (HOSPITAL_COMMUNITY)
Admission: EM | Admit: 2016-07-08 | Discharge: 2016-08-18 | DRG: 003 | Disposition: A | Discharge status: Rehabilitation Facility | Payer: Medicaid Other | Attending: General Surgery | Admitting: General Surgery

## 2016-07-08 ENCOUNTER — Encounter (HOSPITAL_COMMUNITY): Payer: Self-pay | Admitting: Emergency Medicine

## 2016-07-08 ENCOUNTER — Emergency Department (HOSPITAL_COMMUNITY): Payer: Medicaid Other

## 2016-07-08 DIAGNOSIS — E876 Hypokalemia: Secondary | ICD-10-CM | POA: Diagnosis not present

## 2016-07-08 DIAGNOSIS — R338 Other retention of urine: Secondary | ICD-10-CM | POA: Diagnosis present

## 2016-07-08 DIAGNOSIS — J14 Pneumonia due to Hemophilus influenzae: Secondary | ICD-10-CM | POA: Diagnosis not present

## 2016-07-08 DIAGNOSIS — S062X3S Diffuse traumatic brain injury with loss of consciousness of 1 hour to 5 hours 59 minutes, sequela: Secondary | ICD-10-CM | POA: Diagnosis not present

## 2016-07-08 DIAGNOSIS — J189 Pneumonia, unspecified organism: Secondary | ICD-10-CM

## 2016-07-08 DIAGNOSIS — K9429 Other complications of gastrostomy: Secondary | ICD-10-CM | POA: Diagnosis not present

## 2016-07-08 DIAGNOSIS — J15211 Pneumonia due to Methicillin susceptible Staphylococcus aureus: Secondary | ICD-10-CM | POA: Diagnosis not present

## 2016-07-08 DIAGNOSIS — Z9911 Dependence on respirator [ventilator] status: Secondary | ICD-10-CM | POA: Diagnosis not present

## 2016-07-08 DIAGNOSIS — D6489 Other specified anemias: Secondary | ICD-10-CM | POA: Diagnosis not present

## 2016-07-08 DIAGNOSIS — R402112 Coma scale, eyes open, never, at arrival to emergency department: Secondary | ICD-10-CM | POA: Diagnosis present

## 2016-07-08 DIAGNOSIS — R197 Diarrhea, unspecified: Secondary | ICD-10-CM | POA: Diagnosis not present

## 2016-07-08 DIAGNOSIS — K659 Peritonitis, unspecified: Secondary | ICD-10-CM | POA: Diagnosis not present

## 2016-07-08 DIAGNOSIS — R402212 Coma scale, best verbal response, none, at arrival to emergency department: Secondary | ICD-10-CM | POA: Diagnosis present

## 2016-07-08 DIAGNOSIS — S27322A Contusion of lung, bilateral, initial encounter: Secondary | ICD-10-CM | POA: Diagnosis present

## 2016-07-08 DIAGNOSIS — Y906 Blood alcohol level of 120-199 mg/100 ml: Secondary | ICD-10-CM | POA: Diagnosis present

## 2016-07-08 DIAGNOSIS — Y838 Other surgical procedures as the cause of abnormal reaction of the patient, or of later complication, without mention of misadventure at the time of the procedure: Secondary | ICD-10-CM | POA: Diagnosis not present

## 2016-07-08 DIAGNOSIS — Y95 Nosocomial condition: Secondary | ICD-10-CM | POA: Diagnosis not present

## 2016-07-08 DIAGNOSIS — J9811 Atelectasis: Secondary | ICD-10-CM | POA: Diagnosis present

## 2016-07-08 DIAGNOSIS — I158 Other secondary hypertension: Secondary | ICD-10-CM | POA: Diagnosis not present

## 2016-07-08 DIAGNOSIS — Z931 Gastrostomy status: Secondary | ICD-10-CM | POA: Diagnosis not present

## 2016-07-08 DIAGNOSIS — F068 Other specified mental disorders due to known physiological condition: Secondary | ICD-10-CM | POA: Diagnosis not present

## 2016-07-08 DIAGNOSIS — D62 Acute posthemorrhagic anemia: Secondary | ICD-10-CM | POA: Diagnosis present

## 2016-07-08 DIAGNOSIS — S0181XA Laceration without foreign body of other part of head, initial encounter: Secondary | ICD-10-CM | POA: Diagnosis present

## 2016-07-08 DIAGNOSIS — R451 Restlessness and agitation: Secondary | ICD-10-CM | POA: Diagnosis not present

## 2016-07-08 DIAGNOSIS — D72829 Elevated white blood cell count, unspecified: Secondary | ICD-10-CM

## 2016-07-08 DIAGNOSIS — Z978 Presence of other specified devices: Secondary | ICD-10-CM

## 2016-07-08 DIAGNOSIS — R000 Tachycardia, unspecified: Secondary | ICD-10-CM | POA: Diagnosis present

## 2016-07-08 DIAGNOSIS — R739 Hyperglycemia, unspecified: Secondary | ICD-10-CM | POA: Diagnosis not present

## 2016-07-08 DIAGNOSIS — L8990 Pressure ulcer of unspecified site, unspecified stage: Secondary | ICD-10-CM | POA: Insufficient documentation

## 2016-07-08 DIAGNOSIS — J9690 Respiratory failure, unspecified, unspecified whether with hypoxia or hypercapnia: Secondary | ICD-10-CM | POA: Diagnosis present

## 2016-07-08 DIAGNOSIS — E8770 Fluid overload, unspecified: Secondary | ICD-10-CM | POA: Diagnosis not present

## 2016-07-08 DIAGNOSIS — Z930 Tracheostomy status: Secondary | ICD-10-CM

## 2016-07-08 DIAGNOSIS — R339 Retention of urine, unspecified: Secondary | ICD-10-CM | POA: Diagnosis not present

## 2016-07-08 DIAGNOSIS — R21 Rash and other nonspecific skin eruption: Secondary | ICD-10-CM | POA: Diagnosis not present

## 2016-07-08 DIAGNOSIS — R509 Fever, unspecified: Secondary | ICD-10-CM

## 2016-07-08 DIAGNOSIS — I159 Secondary hypertension, unspecified: Secondary | ICD-10-CM | POA: Diagnosis not present

## 2016-07-08 DIAGNOSIS — S069X0S Unspecified intracranial injury without loss of consciousness, sequela: Secondary | ICD-10-CM | POA: Diagnosis not present

## 2016-07-08 DIAGNOSIS — G479 Sleep disorder, unspecified: Secondary | ICD-10-CM | POA: Diagnosis not present

## 2016-07-08 DIAGNOSIS — R0603 Acute respiratory distress: Secondary | ICD-10-CM

## 2016-07-08 DIAGNOSIS — I10 Essential (primary) hypertension: Secondary | ICD-10-CM | POA: Diagnosis present

## 2016-07-08 DIAGNOSIS — S06303S Unspecified focal traumatic brain injury with loss of consciousness of 1 hour to 5 hours 59 minutes, sequela: Secondary | ICD-10-CM | POA: Diagnosis not present

## 2016-07-08 DIAGNOSIS — S06309A Unspecified focal traumatic brain injury with loss of consciousness of unspecified duration, initial encounter: Secondary | ICD-10-CM

## 2016-07-08 DIAGNOSIS — Z298 Encounter for other specified prophylactic measures: Secondary | ICD-10-CM | POA: Diagnosis not present

## 2016-07-08 DIAGNOSIS — S299XXA Unspecified injury of thorax, initial encounter: Secondary | ICD-10-CM

## 2016-07-08 DIAGNOSIS — J398 Other specified diseases of upper respiratory tract: Secondary | ICD-10-CM

## 2016-07-08 DIAGNOSIS — R402342 Coma scale, best motor response, flexion withdrawal, at arrival to emergency department: Secondary | ICD-10-CM | POA: Diagnosis present

## 2016-07-08 DIAGNOSIS — S065X3A Traumatic subdural hemorrhage with loss of consciousness of 1 hour to 5 hours 59 minutes, initial encounter: Principal | ICD-10-CM | POA: Diagnosis present

## 2016-07-08 DIAGNOSIS — S069XAA Unspecified intracranial injury with loss of consciousness status unknown, initial encounter: Secondary | ICD-10-CM | POA: Diagnosis present

## 2016-07-08 DIAGNOSIS — R1312 Dysphagia, oropharyngeal phase: Secondary | ICD-10-CM | POA: Diagnosis not present

## 2016-07-08 DIAGNOSIS — S069X9A Unspecified intracranial injury with loss of consciousness of unspecified duration, initial encounter: Secondary | ICD-10-CM | POA: Diagnosis present

## 2016-07-08 DIAGNOSIS — R0602 Shortness of breath: Secondary | ICD-10-CM

## 2016-07-08 DIAGNOSIS — M7989 Other specified soft tissue disorders: Secondary | ICD-10-CM | POA: Diagnosis not present

## 2016-07-08 DIAGNOSIS — S069X3S Unspecified intracranial injury with loss of consciousness of 1 hour to 5 hours 59 minutes, sequela: Secondary | ICD-10-CM | POA: Diagnosis not present

## 2016-07-08 DIAGNOSIS — Z9119 Patient's noncompliance with other medical treatment and regimen: Secondary | ICD-10-CM

## 2016-07-08 LAB — I-STAT CG4 LACTIC ACID, ED: LACTIC ACID, VENOUS: 2.19 mmol/L — AB (ref 0.5–1.9)

## 2016-07-08 LAB — TYPE AND SCREEN
ABO/RH(D): O POS
ANTIBODY SCREEN: NEGATIVE
Unit division: 0
Unit division: 0

## 2016-07-08 LAB — PREPARE FRESH FROZEN PLASMA
UNIT DIVISION: 0
Unit division: 0

## 2016-07-08 LAB — I-STAT CHEM 8, ED
BUN: 17 mg/dL (ref 6–20)
CHLORIDE: 102 mmol/L (ref 101–111)
Calcium, Ion: 1.1 mmol/L — ABNORMAL LOW (ref 1.15–1.40)
Creatinine, Ser: 1.4 mg/dL — ABNORMAL HIGH (ref 0.61–1.24)
Glucose, Bld: 188 mg/dL — ABNORMAL HIGH (ref 65–99)
HCT: 44 % (ref 39.0–52.0)
HEMOGLOBIN: 15 g/dL (ref 13.0–17.0)
Potassium: 3.7 mmol/L (ref 3.5–5.1)
SODIUM: 139 mmol/L (ref 135–145)
TCO2: 24 mmol/L (ref 0–100)

## 2016-07-08 LAB — URINALYSIS, ROUTINE W REFLEX MICROSCOPIC
BILIRUBIN URINE: NEGATIVE
Bacteria, UA: NONE SEEN
GLUCOSE, UA: 50 mg/dL — AB
KETONES UR: NEGATIVE mg/dL
LEUKOCYTES UA: NEGATIVE
NITRITE: NEGATIVE
PROTEIN: NEGATIVE mg/dL
Specific Gravity, Urine: 1.024 (ref 1.005–1.030)
pH: 5 (ref 5.0–8.0)

## 2016-07-08 LAB — POCT I-STAT 3, ART BLOOD GAS (G3+)
Acid-base deficit: 1 mmol/L (ref 0.0–2.0)
Bicarbonate: 21.8 mmol/L (ref 20.0–28.0)
O2 Saturation: 99 %
TCO2: 23 mmol/L (ref 0–100)
pCO2 arterial: 29.3 mmHg — ABNORMAL LOW (ref 32.0–48.0)
pH, Arterial: 7.479 — ABNORMAL HIGH (ref 7.350–7.450)
pO2, Arterial: 120 mmHg — ABNORMAL HIGH (ref 83.0–108.0)

## 2016-07-08 LAB — COMPREHENSIVE METABOLIC PANEL
ALK PHOS: 70 U/L (ref 38–126)
ALT: 33 U/L (ref 17–63)
ANION GAP: 11 (ref 5–15)
AST: 29 U/L (ref 15–41)
Albumin: 3.9 g/dL (ref 3.5–5.0)
BILIRUBIN TOTAL: 0.4 mg/dL (ref 0.3–1.2)
BUN: 13 mg/dL (ref 6–20)
CALCIUM: 8.7 mg/dL — AB (ref 8.9–10.3)
CO2: 20 mmol/L — ABNORMAL LOW (ref 22–32)
Chloride: 105 mmol/L (ref 101–111)
Creatinine, Ser: 1.23 mg/dL (ref 0.61–1.24)
GFR calc non Af Amer: >60 mL/min (ref >60)
Glucose, Bld: 185 mg/dL — ABNORMAL HIGH (ref 65–99)
POTASSIUM: 3.5 mmol/L (ref 3.5–5.1)
SODIUM: 136 mmol/L (ref 135–145)
TOTAL PROTEIN: 6.9 g/dL (ref 6.5–8.1)

## 2016-07-08 LAB — I-STAT ARTERIAL BLOOD GAS, ED
Acid-base deficit: 4 mmol/L — ABNORMAL HIGH (ref 0.0–2.0)
BICARBONATE: 22.5 mmol/L (ref 20.0–28.0)
O2 SAT: 99 %
PO2 ART: 162 mmHg — AB (ref 83.0–108.0)
Patient temperature: 98.7
TCO2: 24 mmol/L (ref 0–100)
pCO2 arterial: 48.2 mmHg — ABNORMAL HIGH (ref 32.0–48.0)
pH, Arterial: 7.277 — ABNORMAL LOW (ref 7.350–7.450)

## 2016-07-08 LAB — CDS SEROLOGY

## 2016-07-08 LAB — BASIC METABOLIC PANEL
Anion gap: 11 (ref 5–15)
BUN: 10 mg/dL (ref 6–20)
CALCIUM: 8.2 mg/dL — AB (ref 8.9–10.3)
CHLORIDE: 104 mmol/L (ref 101–111)
CO2: 23 mmol/L (ref 22–32)
CREATININE: 1.15 mg/dL (ref 0.61–1.24)
GFR calc non Af Amer: >60 mL/min (ref >60)
GLUCOSE: 107 mg/dL — AB (ref 65–99)
Potassium: 3.4 mmol/L — ABNORMAL LOW (ref 3.5–5.1)
Sodium: 138 mmol/L (ref 135–145)

## 2016-07-08 LAB — TRIGLYCERIDES: Triglycerides: 193 mg/dL — ABNORMAL HIGH (ref <150)

## 2016-07-08 LAB — CBC
HEMATOCRIT: 42.7 % (ref 39.0–52.0)
HEMATOCRIT: 41.2 % (ref 39.0–52.0)
HEMOGLOBIN: 14.4 g/dL (ref 13.0–17.0)
Hemoglobin: 14.8 g/dL (ref 13.0–17.0)
MCH: 33.4 pg (ref 26.0–34.0)
MCH: 33.4 pg (ref 26.0–34.0)
MCHC: 34.7 g/dL (ref 30.0–36.0)
MCHC: 35 g/dL (ref 30.0–36.0)
MCV: 96.4 fL (ref 78.0–100.0)
MCV: 95.6 fL (ref 78.0–100.0)
Platelets: 213 10*3/uL (ref 150–400)
Platelets: 216 10*3/uL (ref 150–400)
RBC: 4.43 MIL/uL (ref 4.22–5.81)
RBC: 4.31 MIL/uL (ref 4.22–5.81)
RDW: 12.4 % (ref 11.5–15.5)
RDW: 12.3 % (ref 11.5–15.5)
WBC: 11.7 10*3/uL — AB (ref 4.0–10.5)
WBC: 15.5 10*3/uL — ABNORMAL HIGH (ref 4.0–10.5)

## 2016-07-08 LAB — RAPID URINE DRUG SCREEN, HOSP PERFORMED
Amphetamines: NOT DETECTED
Barbiturates: NOT DETECTED
Benzodiazepines: NOT DETECTED
COCAINE: NOT DETECTED
OPIATES: NOT DETECTED
Tetrahydrocannabinol: POSITIVE — AB

## 2016-07-08 LAB — ETHANOL: ALCOHOL ETHYL (B): 120 mg/dL — AB (ref <5)

## 2016-07-08 LAB — PROTIME-INR
INR: 0.96
PROTHROMBIN TIME: 12.8 s (ref 11.4–15.2)

## 2016-07-08 LAB — I-STAT TROPONIN, ED: TROPONIN I, POC: 0 ng/mL (ref 0.00–0.08)

## 2016-07-08 LAB — CBG MONITORING, ED: GLUCOSE-CAPILLARY: 171 mg/dL — AB (ref 65–99)

## 2016-07-08 MED ORDER — CEFAZOLIN IN D5W 1 GM/50ML IV SOLN
1.0000 g | Freq: Three times a day (TID) | INTRAVENOUS | Status: DC
  Administered 2016-07-08 – 2016-07-11 (×8): 1 g via INTRAVENOUS
  Filled 2016-07-08 (×9): qty 50

## 2016-07-08 MED ORDER — PROPOFOL 1000 MG/100ML IV EMUL
5.0000 ug/kg/min | Freq: Once | INTRAVENOUS | Status: AC
  Administered 2016-07-08: 20 ug/kg/min via INTRAVENOUS

## 2016-07-08 MED ORDER — POTASSIUM CHLORIDE IN NACL 20-0.9 MEQ/L-% IV SOLN
INTRAVENOUS | Status: DC
  Administered 2016-07-08 – 2016-07-09 (×3): via INTRAVENOUS
  Filled 2016-07-08 (×3): qty 1000

## 2016-07-08 MED ORDER — WHITE PETROLATUM GEL
Status: AC
  Administered 2016-07-08: 08:00:00
  Filled 2016-07-08: qty 1

## 2016-07-08 MED ORDER — ORAL CARE MOUTH RINSE: 15.0000 mL | Freq: Four times a day (QID) | OROMUCOSAL | Status: DC

## 2016-07-08 MED ORDER — PROPOFOL 1000 MG/100ML IV EMUL
0.0000 ug/kg/min | INTRAVENOUS | Status: DC
  Administered 2016-07-08: 40 ug/kg/min via INTRAVENOUS
  Administered 2016-07-08 (×2): 50 ug/kg/min via INTRAVENOUS
  Administered 2016-07-08: 45 ug/kg/min via INTRAVENOUS
  Administered 2016-07-08 – 2016-07-09 (×3): 50 ug/kg/min via INTRAVENOUS
  Filled 2016-07-08 (×6): qty 100

## 2016-07-08 MED ORDER — PANTOPRAZOLE SODIUM 40 MG PO TBEC
40.0000 mg | DELAYED_RELEASE_TABLET | Freq: Every day | ORAL | Status: DC
  Filled 2016-07-08: qty 1

## 2016-07-08 MED ORDER — PANTOPRAZOLE SODIUM 40 MG IV SOLR
40.0000 mg | Freq: Every day | INTRAVENOUS | Status: DC
  Administered 2016-07-08 – 2016-07-24 (×17): 40 mg via INTRAVENOUS
  Filled 2016-07-08 (×17): qty 40

## 2016-07-08 MED ORDER — LEVETIRACETAM 500 MG/5ML IV SOLN
500.0000 mg | Freq: Two times a day (BID) | INTRAVENOUS | Status: DC
  Administered 2016-07-08 – 2016-07-24 (×34): 500 mg via INTRAVENOUS
  Filled 2016-07-08 (×36): qty 5

## 2016-07-08 MED ORDER — FENTANYL CITRATE (PF) 100 MCG/2ML IJ SOLN
50.0000 ug | Freq: Once | INTRAMUSCULAR | Status: AC
  Administered 2016-07-08: 50 ug via INTRAVENOUS

## 2016-07-08 MED ORDER — FENTANYL CITRATE (PF) 100 MCG/2ML IJ SOLN
INTRAMUSCULAR | Status: AC
  Filled 2016-07-08: qty 2

## 2016-07-08 MED ORDER — PROPOFOL 1000 MG/100ML IV EMUL
INTRAVENOUS | Status: AC
  Filled 2016-07-08: qty 100

## 2016-07-08 MED ORDER — VECURONIUM BROMIDE 10 MG IV SOLR
10.0000 mg | Freq: Once | INTRAVENOUS | Status: AC
  Administered 2016-07-08: 10 mg via INTRAVENOUS

## 2016-07-08 MED ORDER — ETOMIDATE 2 MG/ML IV SOLN
40.0000 mg | Freq: Once | INTRAVENOUS | Status: AC
  Administered 2016-07-08: 40 mg via INTRAVENOUS

## 2016-07-08 MED ORDER — FENTANYL BOLUS VIA INFUSION
50.0000 ug | INTRAVENOUS | Status: DC | PRN
  Administered 2016-07-08: 50 ug via INTRAVENOUS
  Filled 2016-07-08: qty 50

## 2016-07-08 MED ORDER — IOPAMIDOL (ISOVUE-300) INJECTION 61%
INTRAVENOUS | Status: AC
  Administered 2016-07-08: 04:00:00
  Filled 2016-07-08: qty 100

## 2016-07-08 MED ORDER — ACETAMINOPHEN 160 MG/5ML PO SOLN
650.0000 mg | Freq: Four times a day (QID) | ORAL | Status: DC | PRN
  Administered 2016-07-08 – 2016-08-14 (×47): 650 mg
  Filled 2016-07-08 (×50): qty 20.3

## 2016-07-08 MED ORDER — ORAL CARE MOUTH RINSE
15.0000 mL | OROMUCOSAL | Status: DC
  Administered 2016-07-08 – 2016-08-08 (×308): 15 mL via OROMUCOSAL

## 2016-07-08 MED ORDER — FENTANYL 2500MCG IN NS 250ML (10MCG/ML) PREMIX INFUSION
25.0000 ug/h | INTRAVENOUS | Status: DC
  Administered 2016-07-08: 100 ug/h via INTRAVENOUS
  Administered 2016-07-08: 150 ug/h via INTRAVENOUS
  Filled 2016-07-08: qty 250

## 2016-07-08 MED ORDER — FENTANYL CITRATE (PF) 100 MCG/2ML IJ SOLN: 50.0000 ug | Freq: Once | INTRAMUSCULAR | Status: DC

## 2016-07-08 MED ORDER — IPRATROPIUM-ALBUTEROL 0.5-2.5 (3) MG/3ML IN SOLN
3.0000 mL | Freq: Four times a day (QID) | RESPIRATORY_TRACT | Status: DC
  Administered 2016-07-08 – 2016-07-09 (×4): 3 mL via RESPIRATORY_TRACT
  Filled 2016-07-08 (×4): qty 3

## 2016-07-08 MED ORDER — FENTANYL 2500MCG IN NS 250ML (10MCG/ML) PREMIX INFUSION
100.0000 ug/h | INTRAVENOUS | Status: DC
  Administered 2016-07-08: 100 ug/h via INTRAVENOUS
  Filled 2016-07-08: qty 250

## 2016-07-08 MED ORDER — TETANUS-DIPHTH-ACELL PERTUSSIS 5-2.5-18.5 LF-MCG/0.5 IM SUSP
0.5000 mL | Freq: Once | INTRAMUSCULAR | Status: AC
  Administered 2016-07-08: 0.5 mL via INTRAMUSCULAR
  Filled 2016-07-08: qty 0.5

## 2016-07-08 MED ORDER — CEFAZOLIN SODIUM-DEXTROSE 2-4 GM/100ML-% IV SOLN
2.0000 g | INTRAVENOUS | Status: AC
  Administered 2016-07-08: 2 g via INTRAVENOUS
  Filled 2016-07-08: qty 100

## 2016-07-08 MED ORDER — SUCCINYLCHOLINE CHLORIDE 20 MG/ML IJ SOLN
120.0000 mg | Freq: Once | INTRAMUSCULAR | Status: AC
Start: 2016-07-08 — End: 2016-07-08
  Administered 2016-07-08: 120 mg via INTRAVENOUS
  Filled 2016-07-08: qty 6

## 2016-07-08 MED ORDER — MANNITOL 20% IV SOLUTION 10G/50ML
25.0000 g | Freq: Four times a day (QID) | INTRAVENOUS | Status: DC | PRN
  Filled 2016-07-08: qty 180

## 2016-07-08 MED ORDER — CHLORHEXIDINE GLUCONATE 0.12% ORAL RINSE (MEDLINE KIT)
15.0000 mL | Freq: Two times a day (BID) | OROMUCOSAL | Status: DC
  Administered 2016-07-08 – 2016-08-18 (×82): 15 mL via OROMUCOSAL

## 2016-07-08 MED ORDER — SODIUM CHLORIDE 0.9 % IV SOLN
Freq: Once | INTRAVENOUS | Status: AC
  Administered 2016-07-08: 03:00:00 via INTRAVENOUS

## 2016-07-08 MED ORDER — MANNITOL 25 % IV SOLN
25.0000 g | Freq: Four times a day (QID) | INTRAVENOUS | Status: DC | PRN
  Administered 2016-07-09 – 2016-07-12 (×4): 25 g via INTRAVENOUS
  Filled 2016-07-08: qty 50
  Filled 2016-07-08: qty 100
  Filled 2016-07-08 (×3): qty 50
  Filled 2016-07-08: qty 100
  Filled 2016-07-08 (×2): qty 50
  Filled 2016-07-08: qty 100
  Filled 2016-07-08 (×2): qty 50

## 2016-07-08 NOTE — ED Notes (Signed)
Propofol drip increased to 7445mcg/kg/min due to agitation.

## 2016-07-08 NOTE — Progress Notes (Signed)
Patient ID: [name], male   DOB: 08/26/1980, 36 y.o.   MRN: [id number] Recently admitted from ED. Increase RR for underventilation. Monitor end tidal CO2. I spoke with his mother at the bedside. Dr. Bevely Palmeritty will consult.  Violeta GelinasBurke Arney Mayabb, MD, MPH, FACS Trauma: [phone] General Surgery: [phone]

## 2016-07-08 NOTE — ED Notes (Addendum)
O Negative blood arrives.
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
## 2016-07-08 NOTE — Progress Notes (Signed)
   07/08/16 0349  Clinical Encounter Type  Visited With Family;Health care provider  Visit Type Follow-up;Spiritual support;Social support  Referral From Nurse  Consult/Referral To Chaplain  Spiritual Encounters  Spiritual Needs Sacred text;Brochure;Prayer;Ritual;Emotional  Stress Factors  Family Stress Factors Exhausted;Health changes;Lack of knowledge    Pt mother is [employer] employee in EKG. [employer] Generalffered ministry of emotional support, prayer, presence, and ministry of hospitality.

## 2016-07-08 NOTE — ED Notes (Signed)
Patient returned from CT
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
## 2016-07-08 NOTE — ED Notes (Signed)
To CT with RN, EMT and MD.
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
## 2016-07-08 NOTE — Progress Notes (Signed)
Exam remains poor No eye opening Localizing but not following commands Will place intracranial pressure monitor rather than EVD due to small ventricles I've discussed this with the patient's mother.  We have discussed the risks and benefits.  She understands and wishes to proceed.
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
## 2016-07-08 NOTE — Procedures (Signed)
Preprocedure diagnosis: Lacerations right forehead and right eyebrow Postprocedure diagnosis: Same Procedure: Irrigation and simple closure lacerations left forehead and left eyebrow, total 7 cm Surgeon: Violeta GelinasBurke [name], M.D. Procedure in detail: His lacerations were copiously irrigated and prepped with Betadine. After this, no palpable foreign bodies remained. The lacerations of his right forehead and eyebrow were closed with interrupted 4-0 Prolene sutures. A sterile dressing was applied and he tolerated this well. Total length 7 cm.  Violeta GelinasBurke Justin Branan, MD, MPH, FACS Trauma: [phone] General Surgery: [phone]

## 2016-07-08 NOTE — Procedures (Signed)
The right frontal area was clipped of hair and prepped and draped in the usual sterile fashion.  A timeout was performed.  The area over Kocher's point was anesthetized with lidocaine.  A small incision was made.  A twist burr hole was fashioned.  The dura was incised.  This was rinsed out with sterile saline.  The bolt was fixated to the skull.  The pressure monitor was calibrated and passed down the bolt.  It was secured in place.  A sterile dressing was applied.  ICP was 13.

## 2016-07-08 NOTE — Progress Notes (Signed)
   07/08/16 0314  Clinical Encounter Type  Visited With Family  Visit Type Follow-up  Referral From Nurse  Consult/Referral To Chaplain  Spiritual Encounters  Spiritual Needs Emotional  Stress Factors  Family Stress Factors Lack of knowledge    Chaplain followed up on page, family in consult B.

## 2016-07-08 NOTE — ED Notes (Signed)
Dr Janee Mornhompson at bedside, laceration repair being done.
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
## 2016-07-08 NOTE — ED Notes (Signed)
Report called to [facility] RN.  Patient to be transported after xrays.
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
## 2016-07-09 ENCOUNTER — Inpatient Hospital Stay (HOSPITAL_COMMUNITY): Payer: Medicaid Other

## 2016-07-09 LAB — BLOOD GAS, ARTERIAL
ACID-BASE DEFICIT: 1.4 mmol/L (ref 0.0–2.0)
Acid-base deficit: 3.3 mmol/L — ABNORMAL HIGH (ref 0.0–2.0)
BICARBONATE: 20.6 mmol/L (ref 20.0–28.0)
Bicarbonate: 23 mmol/L (ref 20.0–28.0)
DRAWN BY: 44135
DRAWN BY: 44135
FIO2: 40
FIO2: 30
LHR: 20 {breaths}/min
MECHVT: 650 mL
MECHVT: 650 mL
O2 SAT: 93.8 %
O2 Saturation: 97 %
PCO2 ART: 43.1 mmHg (ref 32.0–48.0)
PEEP/CPAP: 5 cmH2O
PEEP/CPAP: 5 cmH2O
PH ART: 7.355 (ref 7.350–7.450)
PH ART: 7.392 (ref 7.350–7.450)
PO2 ART: 102 mmHg (ref 83.0–108.0)
PO2 ART: 76.6 mmHg — AB (ref 83.0–108.0)
Patient temperature: 101.6
Patient temperature: 100.5
RATE: 18 resp/min
pCO2 arterial: 35.1 mmHg (ref 32.0–48.0)

## 2016-07-09 LAB — GLUCOSE, CAPILLARY
GLUCOSE-CAPILLARY: 137 mg/dL — AB (ref 65–99)
GLUCOSE-CAPILLARY: 87 mg/dL (ref 65–99)
GLUCOSE-CAPILLARY: 87 mg/dL (ref 65–99)

## 2016-07-09 LAB — PHOSPHORUS: Phosphorus: 2.7 mg/dL (ref 2.5–4.6)

## 2016-07-09 LAB — BASIC METABOLIC PANEL
Anion gap: 6 (ref 5–15)
BUN: 11 mg/dL (ref 6–20)
CHLORIDE: 115 mmol/L — AB (ref 101–111)
CO2: 17 mmol/L — ABNORMAL LOW (ref 22–32)
CREATININE: 1.18 mg/dL (ref 0.61–1.24)
Calcium: 8.7 mg/dL — ABNORMAL LOW (ref 8.9–10.3)
GFR calc Af Amer: >60 mL/min (ref >60)
GFR calc non Af Amer: >60 mL/min (ref >60)
GLUCOSE: 96 mg/dL (ref 65–99)
POTASSIUM: 5.9 mmol/L — AB (ref 3.5–5.1)
SODIUM: 138 mmol/L (ref 135–145)

## 2016-07-09 LAB — MAGNESIUM: MAGNESIUM: 2 mg/dL (ref 1.7–2.4)

## 2016-07-09 LAB — ABO/RH: ABO/RH(D): O POS

## 2016-07-09 MED ORDER — PRO-STAT SUGAR FREE PO LIQD
30.0000 mL | Freq: Two times a day (BID) | ORAL | Status: DC
  Administered 2016-07-09 – 2016-07-10 (×2): 30 mL
  Filled 2016-07-09 (×2): qty 30

## 2016-07-09 MED ORDER — FENTANYL CITRATE (PF) 100 MCG/2ML IJ SOLN: 100.0000 ug | Freq: Once | INTRAMUSCULAR | Status: DC | PRN

## 2016-07-09 MED ORDER — SODIUM CHLORIDE 0.9 % IV SOLN
100.0000 ug/h | INTRAVENOUS | Status: DC
  Administered 2016-07-09 – 2016-07-10 (×2): 300 ug/h via INTRAVENOUS
  Administered 2016-07-11: 350 ug/h via INTRAVENOUS
  Administered 2016-07-11: 450 ug/h via INTRAVENOUS
  Administered 2016-07-11: 350 ug/h via INTRAVENOUS
  Administered 2016-07-12 – 2016-07-20 (×30): 450 ug/h via INTRAVENOUS
  Administered 2016-07-20 (×2): 400 ug/h via INTRAVENOUS
  Administered 2016-07-20: 450 ug/h via INTRAVENOUS
  Administered 2016-07-21: 250 ug/h via INTRAVENOUS
  Administered 2016-07-21: 375 ug/h via INTRAVENOUS
  Administered 2016-07-21: 250 ug/h via INTRAVENOUS
  Administered 2016-07-21: 350 ug/h via INTRAVENOUS
  Administered 2016-07-22: 100 ug/h via INTRAVENOUS
  Administered 2016-07-24: 50 ug/h via INTRAVENOUS
  Administered 2016-07-24: 75 ug/h via INTRAVENOUS
  Administered 2016-07-25: 100 ug/h via INTRAVENOUS
  Administered 2016-07-26: 200 ug/h via INTRAVENOUS
  Administered 2016-07-26 – 2016-07-27 (×2): 100 ug/h via INTRAVENOUS
  Administered 2016-07-29: 75 ug/h via INTRAVENOUS
  Administered 2016-07-30 – 2016-07-31 (×2): 100 ug/h via INTRAVENOUS
  Administered 2016-08-01: 175 ug/h via INTRAVENOUS
  Administered 2016-08-02: 225 ug/h via INTRAVENOUS
  Administered 2016-08-03 – 2016-08-06 (×4): 125 ug/h via INTRAVENOUS
  Administered 2016-08-07 – 2016-08-08 (×2): 100 ug/h via INTRAVENOUS
  Filled 2016-07-09 (×69): qty 50

## 2016-07-09 MED ORDER — SODIUM CHLORIDE 0.9 % IV SOLN
INTRAVENOUS | Status: DC
  Administered 2016-07-09 – 2016-07-18 (×12): via INTRAVENOUS
  Filled 2016-07-09 (×13): qty 1000

## 2016-07-09 MED ORDER — IPRATROPIUM-ALBUTEROL 0.5-2.5 (3) MG/3ML IN SOLN
3.0000 mL | Freq: Three times a day (TID) | RESPIRATORY_TRACT | Status: DC
  Administered 2016-07-09 – 2016-08-01 (×70): 3 mL via RESPIRATORY_TRACT
  Filled 2016-07-09 (×69): qty 3

## 2016-07-09 MED ORDER — CISATRACURIUM BOLUS VIA INFUSION
2.5000 mg | Freq: Once | INTRAVENOUS | Status: AC
  Administered 2016-07-09: 2.5 mg via INTRAVENOUS
  Filled 2016-07-09: qty 3

## 2016-07-09 MED ORDER — FENTANYL BOLUS VIA INFUSION
50.0000 ug | INTRAVENOUS | Status: DC | PRN
  Administered 2016-07-12 – 2016-07-31 (×10): 50 ug via INTRAVENOUS
  Administered 2016-07-31 (×3): 100 ug via INTRAVENOUS
  Administered 2016-08-03 – 2016-08-08 (×2): 50 ug via INTRAVENOUS
  Filled 2016-07-09: qty 50

## 2016-07-09 MED ORDER — ARTIFICIAL TEARS OP OINT
1.0000 "application " | TOPICAL_OINTMENT | Freq: Three times a day (TID) | OPHTHALMIC | Status: DC
  Administered 2016-07-09 – 2016-07-21 (×33): 1 via OPHTHALMIC
  Filled 2016-07-09 (×2): qty 3.5

## 2016-07-09 MED ORDER — FENTANYL CITRATE (PF) 100 MCG/2ML IJ SOLN: 100.0000 ug | Freq: Once | INTRAMUSCULAR | Status: DC

## 2016-07-09 MED ORDER — CISATRACURIUM BESYLATE (PF) 200 MG/20ML IV SOLN
1.0000 ug/kg/min | INTRAVENOUS | Status: DC
  Administered 2016-07-09: 3 ug/kg/min via INTRAVENOUS
  Administered 2016-07-09: 1.5 ug/kg/min via INTRAVENOUS
  Administered 2016-07-10: 3.5 ug/kg/min via INTRAVENOUS
  Administered 2016-07-10 – 2016-07-11 (×3): 3 ug/kg/min via INTRAVENOUS
  Administered 2016-07-12 – 2016-07-15 (×11): 5 ug/kg/min via INTRAVENOUS
  Administered 2016-07-16: 4.5 ug/kg/min via INTRAVENOUS
  Administered 2016-07-16 (×2): 5 ug/kg/min via INTRAVENOUS
  Administered 2016-07-17 – 2016-07-18 (×3): 3 ug/kg/min via INTRAVENOUS
  Filled 2016-07-09 (×26): qty 20

## 2016-07-09 MED ORDER — SODIUM CHLORIDE 0.9% FLUSH
10.0000 mL | Freq: Two times a day (BID) | INTRAVENOUS | Status: DC
  Administered 2016-07-09: 10 mL
  Administered 2016-07-09: 30 mL
  Administered 2016-07-10: 40 mL
  Administered 2016-07-10 – 2016-07-15 (×8): 10 mL
  Administered 2016-07-15 – 2016-07-16 (×2): 20 mL
  Administered 2016-07-16 – 2016-07-18 (×5): 10 mL
  Administered 2016-07-19: 40 mL
  Administered 2016-07-19: 10 mL
  Administered 2016-07-20: 30 mL
  Administered 2016-07-20: 40 mL
  Administered 2016-07-21: 20 mL
  Administered 2016-07-22 (×2): 10 mL
  Administered 2016-07-23: 20 mL
  Administered 2016-07-23 – 2016-07-24 (×2): 10 mL
  Administered 2016-07-25: 20 mL
  Administered 2016-07-26 – 2016-07-27 (×2): 10 mL
  Administered 2016-07-28: 20 mL
  Administered 2016-07-28 – 2016-08-04 (×15): 10 mL
  Administered 2016-08-04 – 2016-08-05 (×2): 30 mL
  Administered 2016-08-05 – 2016-08-06 (×2): 10 mL
  Administered 2016-08-06: 30 mL
  Administered 2016-08-07: 10 mL
  Administered 2016-08-07: 40 mL
  Administered 2016-08-08 – 2016-08-15 (×8): 10 mL
  Administered 2016-08-15: 3 mL
  Administered 2016-08-16: 10 mL
  Administered 2016-08-17: 30 mL
  Administered 2016-08-18: 10 mL

## 2016-07-09 MED ORDER — FENTANYL 2500MCG IN NS 250ML (10MCG/ML) PREMIX INFUSION
100.0000 ug/h | INTRAVENOUS | Status: DC
  Administered 2016-07-09: 300 ug/h via INTRAVENOUS
  Filled 2016-07-09: qty 250

## 2016-07-09 MED ORDER — PROPOFOL 1000 MG/100ML IV EMUL
25.0000 ug/kg/min | INTRAVENOUS | Status: DC
  Administered 2016-07-09: 55 ug/kg/min via INTRAVENOUS
  Administered 2016-07-09 (×2): 60 ug/kg/min via INTRAVENOUS
  Administered 2016-07-09: 55 ug/kg/min via INTRAVENOUS
  Administered 2016-07-09 (×2): 50 ug/kg/min via INTRAVENOUS
  Administered 2016-07-10: 60 ug/kg/min via INTRAVENOUS
  Administered 2016-07-10 (×2): 70 ug/kg/min via INTRAVENOUS
  Administered 2016-07-10: 80 ug/kg/min via INTRAVENOUS
  Administered 2016-07-10: 60 ug/kg/min via INTRAVENOUS
  Administered 2016-07-10 (×2): 70 ug/kg/min via INTRAVENOUS
  Administered 2016-07-10: 80 ug/kg/min via INTRAVENOUS
  Administered 2016-07-10 – 2016-07-11 (×5): 70 ug/kg/min via INTRAVENOUS
  Administered 2016-07-11: 80 ug/kg/min via INTRAVENOUS
  Administered 2016-07-11 (×2): 70 ug/kg/min via INTRAVENOUS
  Administered 2016-07-11: 80 ug/kg/min via INTRAVENOUS
  Filled 2016-07-09 (×25): qty 100

## 2016-07-09 MED ORDER — SODIUM CHLORIDE 0.9% FLUSH: 10.0000 mL | INTRAVENOUS | Status: DC | PRN

## 2016-07-09 MED ORDER — PIVOT 1.5 CAL PO LIQD
1000.0000 mL | ORAL | Status: DC
  Administered 2016-07-09: 1000 mL

## 2016-07-09 NOTE — Progress Notes (Signed)
Pt. is febrile. Given tylenol and started to give bath in an attempt to help lower body temperature. Pt. Immediately started to shiver. ICP increased to 44 and maintained for 5 minutes. Neurosurgeon called and updated that RN was administering 25g Mannitol via IV push. Neurosurgeon asked RN to contact Trauma MD to advise about paralytic orders. Trauma MD instructed RN to begin NMB protocol. RN increased fentanyl to maintain RASS of -5. BIS is 65. RN will obtain new peripheral IV access to begin Nimbex. RN will continue to monitor closely.
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
## 2016-07-09 NOTE — Progress Notes (Signed)
Pt seen and examined. Chemically paralyzed overnight for elevated ICP.  EXAM: Temp:  [100.4 F (38 C)-102 F (38.9 C)] 100.9 F (38.3 C) (01/07 1000) Pulse Rate:  [57-99] 69 (01/07 1000) Resp:  [13-24] 18 (01/07 1000) BP: (122-182)/(59-110) 127/71 (01/07 1000) SpO2:  [95 %-100 %] 98 % (01/07 1000) FiO2 (%):  [30 %-40 %] 30 % (01/07 1000) Intake/Output      01/06 0701 - 01/07 0700 01/07 0701 - 01/08 0700   I.V. (mL/kg) 3602.6 (33.1) 342.9 (3.1)   IV Piggyback 205    Total Intake(mL/kg) 3807.6 (35) 342.9 (3.1)   Urine (mL/kg/hr) 1450 (0.6)    Emesis/NG output     Total Output 1450     Net +2357.6 +342.9         Intubated, sedated, paralyzed PERRL ICP 13 when I rounded, have been less than 15 except just prior to initiating paralytics last night CT head: expected evolution of frontal contusions  ICPs wnl Continue paralytics for one more day at least

## 2016-07-09 NOTE — Progress Notes (Signed)
Peripherally Inserted Central Catheter/Midline Placement  The IV Nurse has discussed with the patient and/or persons authorized to consent for the patient, the purpose of this procedure and the potential benefits and risks involved with this procedure.  The benefits include less needle sticks, lab draws from the catheter, and the patient may be discharged home with the catheter. Risks include, but not limited to, infection, bleeding, blood clot (thrombus formation), and puncture of an artery; nerve damage and irregular heartbeat and possibility to perform a PICC exchange if needed/ordered by physician.  Alternatives to this procedure were also discussed.  Bard Power PICC patient education guide, fact sheet on infection prevention and patient information card has been provided to patient /or left at bedside.  Mother and fiance at bedside gave consent for PICC placement.  PICC/Midline Placement Documentation  PICC Triple Lumen 07/09/16 PICC Right Brachial 44 cm 0 cm (Active)  Indication for Insertion or Continuance of Line Vasoactive infusions 07/09/2016  2:00 PM  Exposed Catheter (cm) 0 cm 07/09/2016  2:00 PM  Site Assessment Clean;Dry;Intact 07/09/2016  2:00 PM  Lumen #1 Status Flushed;Saline locked;Blood return noted 07/09/2016  2:00 PM  Lumen #2 Status Flushed;Saline locked;Blood return noted 07/09/2016  2:00 PM  Lumen #3 Status Flushed;Saline locked;Blood return noted 07/09/2016  2:00 PM  Dressing Type Transparent 07/09/2016  2:00 PM  Dressing Status Clean;Dry;Intact;Antimicrobial disc in place 07/09/2016  2:00 PM  Line Care Connections checked and tightened 07/09/2016  2:00 PM  Line Adjustment (NICU/IV Team Only) No 07/09/2016  2:00 PM  Dressing Intervention New dressing 07/09/2016  2:00 PM  Dressing Change Due 07/16/16 07/09/2016  2:00 PM       Elliot DallyRiggs, Jahnya Trindade Wright 07/09/2016, 2:01 PM

## 2016-07-10 ENCOUNTER — Inpatient Hospital Stay (HOSPITAL_COMMUNITY): Payer: Medicaid Other

## 2016-07-10 LAB — BASIC METABOLIC PANEL
ANION GAP: 5 (ref 5–15)
BUN: 8 mg/dL (ref 6–20)
CALCIUM: 7.9 mg/dL — AB (ref 8.9–10.3)
CHLORIDE: 109 mmol/L (ref 101–111)
CO2: 23 mmol/L (ref 22–32)
Creatinine, Ser: 1.18 mg/dL (ref 0.61–1.24)
GFR calc Af Amer: >60 mL/min (ref >60)
GFR calc non Af Amer: >60 mL/min (ref >60)
Glucose, Bld: 153 mg/dL — ABNORMAL HIGH (ref 65–99)
POTASSIUM: 3.9 mmol/L (ref 3.5–5.1)
Sodium: 137 mmol/L (ref 135–145)

## 2016-07-10 LAB — PHOSPHORUS
PHOSPHORUS: 1.8 mg/dL — AB (ref 2.5–4.6)
PHOSPHORUS: 1.7 mg/dL — AB (ref 2.5–4.6)

## 2016-07-10 LAB — GLUCOSE, CAPILLARY
GLUCOSE-CAPILLARY: 138 mg/dL — AB (ref 65–99)
GLUCOSE-CAPILLARY: 144 mg/dL — AB (ref 65–99)
GLUCOSE-CAPILLARY: 177 mg/dL — AB (ref 65–99)
Glucose-Capillary: 133 mg/dL — ABNORMAL HIGH (ref 65–99)
Glucose-Capillary: 174 mg/dL — ABNORMAL HIGH (ref 65–99)
Glucose-Capillary: 141 mg/dL — ABNORMAL HIGH (ref 65–99)

## 2016-07-10 LAB — BLOOD GAS, ARTERIAL
Acid-base deficit: 1.9 mmol/L (ref 0.0–2.0)
Bicarbonate: 21.9 mmol/L (ref 20.0–28.0)
DRAWN BY: 44135
FIO2: 30
MECHVT: 650 mL
O2 Saturation: 95.7 %
PEEP: 5 cmH2O
PO2 ART: 89 mmHg (ref 83.0–108.0)
Patient temperature: 102.7
RATE: 20 resp/min
pCO2 arterial: 38.1 mmHg (ref 32.0–48.0)
pH, Arterial: 7.39 (ref 7.350–7.450)

## 2016-07-10 LAB — CBC
HCT: 34.6 % — ABNORMAL LOW (ref 39.0–52.0)
HEMOGLOBIN: 11.8 g/dL — AB (ref 13.0–17.0)
MCH: 33.5 pg (ref 26.0–34.0)
MCHC: 34.1 g/dL (ref 30.0–36.0)
MCV: 98.3 fL (ref 78.0–100.0)
Platelets: 157 10*3/uL (ref 150–400)
RBC: 3.52 MIL/uL — AB (ref 4.22–5.81)
RDW: 12.9 % (ref 11.5–15.5)
WBC: 20.3 10*3/uL — AB (ref 4.0–10.5)

## 2016-07-10 LAB — MAGNESIUM
Magnesium: 2 mg/dL (ref 1.7–2.4)
Magnesium: 2.2 mg/dL (ref 1.7–2.4)

## 2016-07-10 MED ORDER — PRO-STAT SUGAR FREE PO LIQD
60.0000 mL | Freq: Four times a day (QID) | ORAL | Status: DC
  Administered 2016-07-10 – 2016-07-13 (×13): 60 mL
  Filled 2016-07-10 (×13): qty 60

## 2016-07-10 MED ORDER — PIVOT 1.5 CAL PO LIQD
1000.0000 mL | ORAL | Status: DC
  Administered 2016-07-10 – 2016-07-12 (×3): 1000 mL

## 2016-07-10 NOTE — Progress Notes (Signed)
Pt seen and examined. No issues overnight.  EXAM: Temp:  [100.5 F (38.1 C)-102.7 F (39.3 C)] 101.2 F (38.4 C) (01/08 0800) Pulse Rate:  [63-100] 88 (01/08 1000) Resp:  [18-27] 27 (01/08 1000) BP: (127-162)/(68-121) 162/81 (01/08 1000) SpO2:  [92 %-100 %] 97 % (01/08 1000) FiO2 (%):  [30 %] 30 % (01/08 1000) Weight:  [111.8 kg (246 lb 7.6 oz)] 111.8 kg (246 lb 7.6 oz) (01/08 0500) Intake/Output      01/07 0701 - 01/08 0700 01/08 0701 - 01/09 0700   I.V. (mL/kg) 3906.4 (34.9) 564.6 (5.1)   NG/GT 520 120   IV Piggyback 205 105   Total Intake(mL/kg) 4631.4 (41.4) 789.6 (7.1)   Urine (mL/kg/hr) 1675 (0.6)    Total Output 1675     Net +2956.4 +789.6          ICPs well controlled  Intubated, sedated, paralyzed PERRL Stable Stop paralytics, resume if ICPs sustained >20 for 5 minutes
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
## 2016-07-11 LAB — TRIGLYCERIDES
TRIGLYCERIDES: 501 mg/dL — AB (ref <150)
Triglycerides: 569 mg/dL — ABNORMAL HIGH (ref <150)

## 2016-07-11 LAB — PHOSPHORUS: Phosphorus: 2.1 mg/dL — ABNORMAL LOW (ref 2.5–4.6)

## 2016-07-11 LAB — GLUCOSE, CAPILLARY
GLUCOSE-CAPILLARY: 206 mg/dL — AB (ref 65–99)
GLUCOSE-CAPILLARY: 207 mg/dL — AB (ref 65–99)
GLUCOSE-CAPILLARY: 218 mg/dL — AB (ref 65–99)
Glucose-Capillary: 211 mg/dL — ABNORMAL HIGH (ref 65–99)
Glucose-Capillary: 211 mg/dL — ABNORMAL HIGH (ref 65–99)
Glucose-Capillary: 186 mg/dL — ABNORMAL HIGH (ref 65–99)

## 2016-07-11 LAB — MAGNESIUM: MAGNESIUM: 2.3 mg/dL (ref 1.7–2.4)

## 2016-07-11 MED ORDER — CLONAZEPAM 0.5 MG PO TABS
0.5000 mg | ORAL_TABLET | Freq: Two times a day (BID) | ORAL | Status: DC
  Administered 2016-07-11 (×2): 0.5 mg
  Filled 2016-07-11 (×3): qty 1

## 2016-07-11 MED ORDER — VANCOMYCIN HCL 10 G IV SOLR
2000.0000 mg | Freq: Once | INTRAVENOUS | Status: AC
  Administered 2016-07-11: 2000 mg via INTRAVENOUS
  Filled 2016-07-11: qty 2000

## 2016-07-11 MED ORDER — QUETIAPINE FUMARATE 25 MG PO TABS
50.0000 mg | ORAL_TABLET | Freq: Two times a day (BID) | ORAL | Status: DC
  Administered 2016-07-11 (×2): 50 mg
  Filled 2016-07-11 (×3): qty 2

## 2016-07-11 MED ORDER — DOCUSATE SODIUM 50 MG/5ML PO LIQD
100.0000 mg | Freq: Every day | ORAL | Status: DC
  Administered 2016-07-11 – 2016-07-25 (×14): 100 mg via ORAL
  Filled 2016-07-11 (×16): qty 10

## 2016-07-11 MED ORDER — HYDROMORPHONE HCL 1 MG/ML IJ SOLN
1.0000 mg | INTRAMUSCULAR | Status: DC | PRN
  Administered 2016-07-11 – 2016-07-19 (×11): 1 mg via INTRAVENOUS
  Filled 2016-07-11 (×12): qty 1

## 2016-07-11 MED ORDER — SODIUM CHLORIDE 0.9 % IV SOLN
0.0000 mg/h | INTRAVENOUS | Status: DC
  Administered 2016-07-11: 5 mg/h via INTRAVENOUS
  Administered 2016-07-11 – 2016-07-19 (×29): 10 mg/h via INTRAVENOUS
  Administered 2016-07-20: 7 mg/h via INTRAVENOUS
  Administered 2016-07-20: 10 mg/h via INTRAVENOUS
  Administered 2016-07-21: 4 mg/h via INTRAVENOUS
  Filled 2016-07-11: qty 20
  Filled 2016-07-11 (×2): qty 10
  Filled 2016-07-11: qty 20
  Filled 2016-07-11: qty 10
  Filled 2016-07-11: qty 20
  Filled 2016-07-11 (×3): qty 10
  Filled 2016-07-11: qty 20
  Filled 2016-07-11: qty 10
  Filled 2016-07-11 (×2): qty 20
  Filled 2016-07-11 (×3): qty 10
  Filled 2016-07-11: qty 20
  Filled 2016-07-11: qty 10
  Filled 2016-07-11: qty 20
  Filled 2016-07-11 (×3): qty 10
  Filled 2016-07-11 (×2): qty 20
  Filled 2016-07-11 (×3): qty 10
  Filled 2016-07-11: qty 20
  Filled 2016-07-11 (×2): qty 10
  Filled 2016-07-11: qty 20
  Filled 2016-07-11: qty 10
  Filled 2016-07-11: qty 20
  Filled 2016-07-11 (×5): qty 10

## 2016-07-11 MED ORDER — HYDROMORPHONE HCL 1 MG/ML IJ SOLN
1.0000 mg | INTRAMUSCULAR | Status: DC | PRN
  Administered 2016-07-11 – 2016-07-14 (×3): 1 mg via INTRAVENOUS
  Filled 2016-07-11 (×2): qty 1

## 2016-07-11 MED ORDER — ENALAPRILAT 1.25 MG/ML IV SOLN
1.2500 mg | Freq: Four times a day (QID) | INTRAVENOUS | Status: DC | PRN
  Administered 2016-07-11 – 2016-08-09 (×10): 1.25 mg via INTRAVENOUS
  Filled 2016-07-11 (×13): qty 1

## 2016-07-11 MED ORDER — PIPERACILLIN-TAZOBACTAM 3.375 G IVPB
3.3750 g | Freq: Three times a day (TID) | INTRAVENOUS | Status: DC
  Administered 2016-07-11 – 2016-07-14 (×9): 3.375 g via INTRAVENOUS
  Filled 2016-07-11 (×11): qty 50

## 2016-07-11 MED ORDER — SENNOSIDES 8.8 MG/5ML PO SYRP
15.0000 mL | ORAL_SOLUTION | Freq: Every day | ORAL | Status: DC
  Administered 2016-07-11 – 2016-07-25 (×14): 15 mL
  Filled 2016-07-11 (×15): qty 15

## 2016-07-11 MED ORDER — MIDAZOLAM BOLUS VIA INFUSION
1.0000 mg | INTRAVENOUS | Status: DC | PRN
  Administered 2016-07-12: 2 mg via INTRAVENOUS
  Administered 2016-07-17 (×2): 1 mg via INTRAVENOUS
  Administered 2016-07-17 – 2016-07-21 (×3): 2 mg via INTRAVENOUS
  Filled 2016-07-11: qty 2

## 2016-07-11 MED ORDER — VANCOMYCIN HCL IN DEXTROSE 1-5 GM/200ML-% IV SOLN
1000.0000 mg | Freq: Three times a day (TID) | INTRAVENOUS | Status: DC
  Administered 2016-07-11 – 2016-07-13 (×5): 1000 mg via INTRAVENOUS
  Filled 2016-07-11 (×6): qty 200
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
## 2016-07-11 NOTE — Progress Notes (Signed)
Patient axillary temperature of 102. Temp foley hooked up to monitor and reading 103. Tylenol given as directed and ice packs placed on patient. Will continue to monitor.

## 2016-07-11 NOTE — Progress Notes (Signed)
Trauma MD called in regards to patient's fever. Tylenol given as directed with no decrease in patient temperature. Order placed for cooling blanket and Materials called for blanket and machine.

## 2016-07-11 NOTE — Progress Notes (Signed)
Sputum sample obtained and sent to lab by RT. 

## 2016-07-11 NOTE — Progress Notes (Signed)
Trauma MD Dr. Andrey CampanileWilson paged in regards to hypertension and possible sedation issue. Notified MD that patient has become increasingly hypertensive and is now 186/96. Also informed him that patient will become tachycardic and ICP will go up when stimulated including mouth care. Orders placed to increase maximum Fentanyl rate to 350 mcg/hr. Order also placed for Vasotec and Dilaudid prn. Will administer as directed and continue to monitor patient.
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
## 2016-07-11 NOTE — Progress Notes (Signed)
Pt seen and examined. Efforts made to wean paralytics but ICPs quickly became elevated and paralytics were restarted.  EXAM: Temp:  [96.4 F (35.8 C)-103.3 F (39.6 C)] 96.6 F (35.9 C) (01/09 1200) Pulse Rate:  [56-104] 61 (01/09 1200) Resp:  [20-23] 20 (01/09 1200) BP: (141-197)/(68-163) 152/90 (01/09 1200) SpO2:  [82 %-100 %] 98 % (01/09 1200) FiO2 (%):  [40 %-50 %] 40 % (01/09 1200) Weight:  [117.7 kg (259 lb 7.7 oz)] 117.7 kg (259 lb 7.7 oz) (01/09 0500) Intake/Output      01/08 0701 - 01/09 0700 01/09 0701 - 01/10 0700   I.V. (mL/kg) 4939.5 (42) 1001.5 (8.5)   NG/GT 712.3 125   IV Piggyback 360 605   Total Intake(mL/kg) 6011.7 (51.1) 1731.5 (14.7)   Urine (mL/kg/hr) 4500 (1.6)    Total Output 4500     Net +1511.7 +1731.5         Intubated, sedated, paralyzed PERRL  Guarded condition ICPs controlled with paralytics Continue paralytics for an additional 48 hours and then attempt to wean

## 2016-07-12 ENCOUNTER — Inpatient Hospital Stay (HOSPITAL_COMMUNITY): Payer: Medicaid Other

## 2016-07-12 LAB — GLUCOSE, CAPILLARY
GLUCOSE-CAPILLARY: 187 mg/dL — AB (ref 65–99)
GLUCOSE-CAPILLARY: 182 mg/dL — AB (ref 65–99)
Glucose-Capillary: 183 mg/dL — ABNORMAL HIGH (ref 65–99)
Glucose-Capillary: 220 mg/dL — ABNORMAL HIGH (ref 65–99)
Glucose-Capillary: 233 mg/dL — ABNORMAL HIGH (ref 65–99)
Glucose-Capillary: 251 mg/dL — ABNORMAL HIGH (ref 65–99)

## 2016-07-12 LAB — BLOOD GAS, ARTERIAL
Acid-Base Excess: 2.2 mmol/L — ABNORMAL HIGH (ref 0.0–2.0)
Bicarbonate: 26.4 mmol/L (ref 20.0–28.0)
Drawn by: 449561
FIO2: 40
MECHVT: 0.65 mL
O2 Saturation: 95.3 %
PATIENT TEMPERATURE: 37.6
PCO2 ART: 41.8 mmHg (ref 32.0–48.0)
PEEP: 5 cmH2O
PO2 ART: 76.3 mmHg — AB (ref 83.0–108.0)
RATE: 20 resp/min
pH, Arterial: 7.416 (ref 7.350–7.450)

## 2016-07-12 LAB — URINE CULTURE: CULTURE: NO GROWTH

## 2016-07-12 LAB — BASIC METABOLIC PANEL
Anion gap: 7 (ref 5–15)
BUN: 10 mg/dL (ref 6–20)
CHLORIDE: 105 mmol/L (ref 101–111)
CO2: 25 mmol/L (ref 22–32)
CREATININE: 0.77 mg/dL (ref 0.61–1.24)
Calcium: 8.3 mg/dL — ABNORMAL LOW (ref 8.9–10.3)
GFR calc Af Amer: >60 mL/min (ref >60)
GFR calc non Af Amer: >60 mL/min (ref >60)
GLUCOSE: 190 mg/dL — AB (ref 65–99)
Potassium: 4.1 mmol/L (ref 3.5–5.1)
SODIUM: 137 mmol/L (ref 135–145)

## 2016-07-12 LAB — CBC
HCT: 32.2 % — ABNORMAL LOW (ref 39.0–52.0)
HEMOGLOBIN: 10.6 g/dL — AB (ref 13.0–17.0)
MCH: 32 pg (ref 26.0–34.0)
MCHC: 32.9 g/dL (ref 30.0–36.0)
MCV: 97.3 fL (ref 78.0–100.0)
Platelets: 197 10*3/uL (ref 150–400)
RBC: 3.31 MIL/uL — ABNORMAL LOW (ref 4.22–5.81)
RDW: 12.6 % (ref 11.5–15.5)
WBC: 20.5 10*3/uL — ABNORMAL HIGH (ref 4.0–10.5)

## 2016-07-12 MED ORDER — HYDRALAZINE HCL 20 MG/ML IJ SOLN
10.0000 mg | INTRAMUSCULAR | Status: DC | PRN
  Administered 2016-07-12 – 2016-08-15 (×14): 10 mg via INTRAVENOUS
  Filled 2016-07-12 (×16): qty 1

## 2016-07-12 MED ORDER — METOPROLOL TARTRATE 25 MG/10 ML ORAL SUSPENSION
25.0000 mg | Freq: Two times a day (BID) | ORAL | Status: DC
  Administered 2016-07-12 (×2): 25 mg
  Filled 2016-07-12 (×2): qty 10

## 2016-07-12 MED ORDER — POTASSIUM PHOSPHATES 15 MMOLE/5ML IV SOLN
20.0000 mmol | Freq: Once | INTRAVENOUS | Status: AC
  Administered 2016-07-12: 20 mmol via INTRAVENOUS
  Filled 2016-07-12: qty 6.67

## 2016-07-12 MED ORDER — INSULIN ASPART 100 UNIT/ML ~~LOC~~ SOLN
0.0000 [IU] | SUBCUTANEOUS | Status: DC
  Administered 2016-07-12 (×2): 3 [IU] via SUBCUTANEOUS
  Administered 2016-07-12: 8 [IU] via SUBCUTANEOUS
  Administered 2016-07-12: 3 [IU] via SUBCUTANEOUS
  Administered 2016-07-13 (×3): 5 [IU] via SUBCUTANEOUS
  Administered 2016-07-13: 3 [IU] via SUBCUTANEOUS
  Administered 2016-07-13: 8 [IU] via SUBCUTANEOUS
  Administered 2016-07-13 – 2016-07-14 (×3): 5 [IU] via SUBCUTANEOUS
  Administered 2016-07-14: 3 [IU] via SUBCUTANEOUS
  Administered 2016-07-14 (×3): 5 [IU] via SUBCUTANEOUS
  Administered 2016-07-15: 8 [IU] via SUBCUTANEOUS
  Administered 2016-07-15 (×2): 5 [IU] via SUBCUTANEOUS
  Administered 2016-07-15: 8 [IU] via SUBCUTANEOUS
  Administered 2016-07-15 (×3): 5 [IU] via SUBCUTANEOUS
  Administered 2016-07-16: 8 [IU] via SUBCUTANEOUS
  Administered 2016-07-16: 2 [IU] via SUBCUTANEOUS
  Administered 2016-07-16: 8 [IU] via SUBCUTANEOUS
  Administered 2016-07-16: 3 [IU] via SUBCUTANEOUS
  Administered 2016-07-16: 5 [IU] via SUBCUTANEOUS
  Administered 2016-07-16: 3 [IU] via SUBCUTANEOUS
  Administered 2016-07-17: 2 [IU] via SUBCUTANEOUS
  Administered 2016-07-17 (×3): 5 [IU] via SUBCUTANEOUS
  Administered 2016-07-17: 3 [IU] via SUBCUTANEOUS
  Administered 2016-07-17: 8 [IU] via SUBCUTANEOUS
  Administered 2016-07-18: 2 [IU] via SUBCUTANEOUS
  Administered 2016-07-18: 3 [IU] via SUBCUTANEOUS
  Administered 2016-07-18 (×2): 2 [IU] via SUBCUTANEOUS
  Administered 2016-07-18: 3 [IU] via SUBCUTANEOUS
  Administered 2016-07-18: 2 [IU] via SUBCUTANEOUS
  Administered 2016-07-19 (×3): 3 [IU] via SUBCUTANEOUS
  Administered 2016-07-19: 2 [IU] via SUBCUTANEOUS
  Administered 2016-07-19: 3 [IU] via SUBCUTANEOUS
  Administered 2016-07-20 (×2): 2 [IU] via SUBCUTANEOUS
  Administered 2016-07-20: 3 [IU] via SUBCUTANEOUS
  Administered 2016-07-20: 2 [IU] via SUBCUTANEOUS
  Administered 2016-07-20: 3 [IU] via SUBCUTANEOUS
  Administered 2016-07-20: 2 [IU] via SUBCUTANEOUS
  Administered 2016-07-21: 3 [IU] via SUBCUTANEOUS
  Administered 2016-07-21 (×3): 2 [IU] via SUBCUTANEOUS
  Administered 2016-07-22 (×2): 3 [IU] via SUBCUTANEOUS
  Administered 2016-07-22: 2 [IU] via SUBCUTANEOUS
  Administered 2016-07-22: 3 [IU] via SUBCUTANEOUS
  Administered 2016-07-22 (×2): 2 [IU] via SUBCUTANEOUS
  Administered 2016-07-23: 3 [IU] via SUBCUTANEOUS
  Administered 2016-07-23: 2 [IU] via SUBCUTANEOUS
  Administered 2016-07-24 (×2): 3 [IU] via SUBCUTANEOUS
  Administered 2016-07-24: 2 [IU] via SUBCUTANEOUS
  Administered 2016-07-24 (×2): 3 [IU] via SUBCUTANEOUS
  Administered 2016-07-24: 2 [IU] via SUBCUTANEOUS
  Administered 2016-07-25 (×4): 3 [IU] via SUBCUTANEOUS
  Administered 2016-07-25: 2 [IU] via SUBCUTANEOUS
  Administered 2016-07-25 – 2016-07-26 (×2): 5 [IU] via SUBCUTANEOUS
  Administered 2016-07-26: 2 [IU] via SUBCUTANEOUS
  Administered 2016-07-26: 3 [IU] via SUBCUTANEOUS
  Administered 2016-07-26: 2 [IU] via SUBCUTANEOUS
  Administered 2016-07-26: 3 [IU] via SUBCUTANEOUS
  Administered 2016-07-26 – 2016-07-27 (×4): 2 [IU] via SUBCUTANEOUS
  Administered 2016-07-28 (×3): 3 [IU] via SUBCUTANEOUS
  Administered 2016-07-28: 2 [IU] via SUBCUTANEOUS
  Administered 2016-07-28: 3 [IU] via SUBCUTANEOUS
  Administered 2016-07-28: 2 [IU] via SUBCUTANEOUS
  Administered 2016-07-29 (×3): 3 [IU] via SUBCUTANEOUS
  Administered 2016-07-29: 2 [IU] via SUBCUTANEOUS
  Administered 2016-07-29 (×2): 3 [IU] via SUBCUTANEOUS
  Administered 2016-07-30 (×3): 2 [IU] via SUBCUTANEOUS
  Administered 2016-07-30 (×2): 3 [IU] via SUBCUTANEOUS
  Administered 2016-07-31 (×2): 2 [IU] via SUBCUTANEOUS
  Administered 2016-07-31: 3 [IU] via SUBCUTANEOUS
  Administered 2016-08-01 (×3): 8 [IU] via SUBCUTANEOUS
  Administered 2016-08-01: 3 [IU] via SUBCUTANEOUS
  Administered 2016-08-02: 2 [IU] via SUBCUTANEOUS
  Administered 2016-08-02: 8 [IU] via SUBCUTANEOUS
  Administered 2016-08-02 (×2): 3 [IU] via SUBCUTANEOUS
  Administered 2016-08-02: 5 [IU] via SUBCUTANEOUS
  Administered 2016-08-03 (×2): 3 [IU] via SUBCUTANEOUS
  Administered 2016-08-03: 5 [IU] via SUBCUTANEOUS
  Administered 2016-08-03: 3 [IU] via SUBCUTANEOUS
  Administered 2016-08-03: 5 [IU] via SUBCUTANEOUS
  Administered 2016-08-03: 2 [IU] via SUBCUTANEOUS
  Administered 2016-08-04 (×4): 3 [IU] via SUBCUTANEOUS
  Administered 2016-08-04: 2 [IU] via SUBCUTANEOUS
  Administered 2016-08-04 – 2016-08-05 (×3): 3 [IU] via SUBCUTANEOUS
  Administered 2016-08-05: 2 [IU] via SUBCUTANEOUS
  Administered 2016-08-05 (×2): 3 [IU] via SUBCUTANEOUS
  Administered 2016-08-05 – 2016-08-06 (×2): 2 [IU] via SUBCUTANEOUS
  Administered 2016-08-06: 5 [IU] via SUBCUTANEOUS
  Administered 2016-08-06 (×3): 2 [IU] via SUBCUTANEOUS
  Administered 2016-08-06: 3 [IU] via SUBCUTANEOUS
  Administered 2016-08-07 (×5): 2 [IU] via SUBCUTANEOUS
  Administered 2016-08-07: 3 [IU] via SUBCUTANEOUS
  Administered 2016-08-08 (×2): 2 [IU] via SUBCUTANEOUS
  Administered 2016-08-08 (×2): 3 [IU] via SUBCUTANEOUS
  Administered 2016-08-08: 2 [IU] via SUBCUTANEOUS
  Administered 2016-08-08 – 2016-08-09 (×6): 3 [IU] via SUBCUTANEOUS
  Administered 2016-08-09 – 2016-08-10 (×2): 2 [IU] via SUBCUTANEOUS
  Administered 2016-08-10: 3 [IU] via SUBCUTANEOUS
  Administered 2016-08-10 (×2): 2 [IU] via SUBCUTANEOUS
  Administered 2016-08-10: 3 [IU] via SUBCUTANEOUS
  Administered 2016-08-11 (×4): 2 [IU] via SUBCUTANEOUS
  Administered 2016-08-11 – 2016-08-13 (×5): 3 [IU] via SUBCUTANEOUS
  Administered 2016-08-13 (×2): 2 [IU] via SUBCUTANEOUS
  Administered 2016-08-13: 3 [IU] via SUBCUTANEOUS
  Administered 2016-08-13: 0 [IU] via SUBCUTANEOUS
  Administered 2016-08-14 – 2016-08-18 (×13): 2 [IU] via SUBCUTANEOUS

## 2016-07-12 MED ORDER — CLONAZEPAM 1 MG PO TABS
1.0000 mg | ORAL_TABLET | Freq: Two times a day (BID) | ORAL | Status: DC
  Administered 2016-07-12 – 2016-07-13 (×4): 1 mg
  Filled 2016-07-12 (×4): qty 1

## 2016-07-12 MED ORDER — QUETIAPINE FUMARATE 100 MG PO TABS
100.0000 mg | ORAL_TABLET | Freq: Two times a day (BID) | ORAL | Status: DC
  Administered 2016-07-12 – 2016-07-13 (×4): 100 mg
  Filled 2016-07-12 (×4): qty 1

## 2016-07-12 NOTE — Progress Notes (Signed)
Pt seen and examined. Required mannitol x1 last night.Marland Kitchen.  EXAM: Temp:  [97.7 F (36.5 C)-100.9 F (38.3 C)] 100.6 F (38.1 C) (01/10 1620) Pulse Rate:  [75-121] 91 (01/10 1400) Resp:  [15-23] 20 (01/10 1400) BP: (169-185)/(93-116) 178/93 (01/10 1400) SpO2:  [93 %-100 %] 97 % (01/10 1605) FiO2 (%):  [40 %] 40 % (01/10 1605) Weight:  [112.1 kg (247 lb 2.2 oz)] 112.1 kg (247 lb 2.2 oz) (01/10 0402) Intake/Output      01/09 0701 - 01/10 0700 01/10 0701 - 01/11 0700   I.V. (mL/kg) 4109.2 (36.7) 1156.2 (10.3)   NG/GT 575 150   IV Piggyback 1060 811.7   Total Intake(mL/kg) 5744.2 (51.2) 2117.9 (18.9)   Urine (mL/kg/hr) 3850 (1.4) 275 (0.3)   Emesis/NG output  400 (0.4)   Total Output 3850 675   Net +1894.2 +1442.9         Intubated, sedated, paralyzed PERRL  Guarded condition ICPs controlled with paralytics Continue paralytics until tomorrow and then attempt to wean
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
## 2016-07-12 NOTE — Progress Notes (Addendum)
Inpatient Diabetes Program Recommendations  AACE/ADA: New Consensus Statement on Inpatient Glycemic Control (2015)  Target Ranges:  Prepandial:   less than 140 mg/dL      Peak postprandial:   less than 180 mg/dL (1-2 hours)      Critically ill patients:  140 - 180 mg/dL   Lab Results  Component Value Date   GLUCAP 187 (H) 07/12/2016   Results for [name], Bucky T (MRN [id number]) as of 07/12/2016 08:46  Ref. Range 07/11/2016 07:16 07/11/2016 12:26 07/11/2016 15:55 07/11/2016 19:34 07/11/2016 23:35 07/12/2016 03:41 07/12/2016 07:32  Glucose-Capillary Latest Ref Range: 65 - 99 mg/dL 409211 (H) 811186 (H) 914207 (H) 218 (H) 206 (H) 220 (H) 187 (H)   Review of Glycemic Control  Diabetes history: none, obesity Outpatient Diabetes medications: none Current orders for Inpatient glycemic control: Novolog 0-15 units Q4H  Inpatient Diabetes Program Recommendations:    Per ADA recommendations "consider performing an A1C on all patients with diabetes or hyperglycemia admitted to the hospital if not performed in the prior 3 months".  Thank you,  Kristine LineaKaren Somalia Segler, RN, MSN Diabetes Coordinator Inpatient Diabetes Program [phone] (Team Pager)

## 2016-07-13 ENCOUNTER — Inpatient Hospital Stay (HOSPITAL_COMMUNITY): Payer: Medicaid Other

## 2016-07-13 LAB — CBC
HEMATOCRIT: 31.1 % — AB (ref 39.0–52.0)
Hemoglobin: 10.2 g/dL — ABNORMAL LOW (ref 13.0–17.0)
MCH: 32.2 pg (ref 26.0–34.0)
MCHC: 32.8 g/dL (ref 30.0–36.0)
MCV: 98.1 fL (ref 78.0–100.0)
PLATELETS: 215 10*3/uL (ref 150–400)
RBC: 3.17 MIL/uL — AB (ref 4.22–5.81)
RDW: 13.1 % (ref 11.5–15.5)
WBC: 16.5 10*3/uL — AB (ref 4.0–10.5)

## 2016-07-13 LAB — CULTURE, RESPIRATORY W GRAM STAIN

## 2016-07-13 LAB — GLUCOSE, CAPILLARY
GLUCOSE-CAPILLARY: 205 mg/dL — AB (ref 65–99)
GLUCOSE-CAPILLARY: 214 mg/dL — AB (ref 65–99)
Glucose-Capillary: 263 mg/dL — ABNORMAL HIGH (ref 65–99)
Glucose-Capillary: 215 mg/dL — ABNORMAL HIGH (ref 65–99)
Glucose-Capillary: 186 mg/dL — ABNORMAL HIGH (ref 65–99)

## 2016-07-13 LAB — POCT I-STAT 3, ART BLOOD GAS (G3+)
Acid-Base Excess: 1 mmol/L (ref 0.0–2.0)
Bicarbonate: 26.1 mmol/L (ref 20.0–28.0)
O2 Saturation: 98 %
TCO2: 27 mmol/L (ref 0–100)
pCO2 arterial: 43.6 mmHg (ref 32.0–48.0)
pH, Arterial: 7.387 (ref 7.350–7.450)
pO2, Arterial: 104 mmHg (ref 83.0–108.0)

## 2016-07-13 LAB — CULTURE, RESPIRATORY

## 2016-07-13 LAB — BASIC METABOLIC PANEL
ANION GAP: 9 (ref 5–15)
BUN: 16 mg/dL (ref 6–20)
CALCIUM: 8.4 mg/dL — AB (ref 8.9–10.3)
CO2: 25 mmol/L (ref 22–32)
Chloride: 100 mmol/L — ABNORMAL LOW (ref 101–111)
Creatinine, Ser: 0.73 mg/dL (ref 0.61–1.24)
Glucose, Bld: 250 mg/dL — ABNORMAL HIGH (ref 65–99)
POTASSIUM: 4 mmol/L (ref 3.5–5.1)
Sodium: 134 mmol/L — ABNORMAL LOW (ref 135–145)

## 2016-07-13 MED ORDER — PIVOT 1.5 CAL PO LIQD
1000.0000 mL | ORAL | Status: DC
  Administered 2016-07-13 – 2016-07-17 (×6): 1000 mL
  Filled 2016-07-13: qty 1000

## 2016-07-13 MED ORDER — METOPROLOL TARTRATE 25 MG/10 ML ORAL SUSPENSION
25.0000 mg | Freq: Two times a day (BID) | ORAL | Status: DC
  Administered 2016-07-13 (×2): 25 mg
  Filled 2016-07-13 (×2): qty 10

## 2016-07-13 MED ORDER — ESMOLOL HCL-SODIUM CHLORIDE 2000 MG/100ML IV SOLN
25.0000 ug/kg/min | INTRAVENOUS | Status: DC
  Administered 2016-07-13 (×2): 25 ug/kg/min via INTRAVENOUS
  Administered 2016-07-13: 65 ug/kg/min via INTRAVENOUS
  Administered 2016-07-14: 35 ug/kg/min via INTRAVENOUS
  Administered 2016-07-14: 100 ug/kg/min via INTRAVENOUS
  Administered 2016-07-14: 40 ug/kg/min via INTRAVENOUS
  Administered 2016-07-14: 55 ug/kg/min via INTRAVENOUS
  Administered 2016-07-14: 75 ug/kg/min via INTRAVENOUS
  Administered 2016-07-15 (×2): 150 ug/kg/min via INTRAVENOUS
  Administered 2016-07-15: 200 ug/kg/min via INTRAVENOUS
  Administered 2016-07-15: 150 ug/kg/min via INTRAVENOUS
  Administered 2016-07-15: 75 ug/kg/min via INTRAVENOUS
  Administered 2016-07-15 (×3): 150 ug/kg/min via INTRAVENOUS
  Administered 2016-07-15: 200 ug/kg/min via INTRAVENOUS
  Administered 2016-07-15: 150 ug/kg/min via INTRAVENOUS
  Administered 2016-07-15 – 2016-07-16 (×2): 200 ug/kg/min via INTRAVENOUS
  Administered 2016-07-16: 30 ug/kg/min via INTRAVENOUS
  Administered 2016-07-16 (×2): 200 ug/kg/min via INTRAVENOUS
  Administered 2016-07-16: 100 ug/kg/min via INTRAVENOUS
  Administered 2016-07-16: 30 ug/kg/min via INTRAVENOUS
  Administered 2016-07-17: 25 ug/kg/min via INTRAVENOUS
  Filled 2016-07-13 (×27): qty 100

## 2016-07-13 MED ORDER — PRO-STAT SUGAR FREE PO LIQD
30.0000 mL | Freq: Three times a day (TID) | ORAL | Status: DC
  Administered 2016-07-13 – 2016-07-18 (×14): 30 mL
  Filled 2016-07-13 (×14): qty 30

## 2016-07-13 NOTE — Progress Notes (Signed)
Pt ICP climb to 26 with no stimulation. Noted HR to increase as well as BP begin to climb, see vital documentation. Schedule meds given, PRN hydralazine given, bolus of fentanyl and versed given. Pt continues to sustain 25-27 ICP after 10mins. PRN mannitol given. ICP continues to sustain. 1mg  Dilaudid given, see emar for all meds given.  Dr. Franky Machoabbell called [phone]. Notify of pt's HR, BP, ICP, and this RN's interventions. Inquired about BP and any other medicine to give. Instructed to call Trauma and discuss with them, but a BP drip would be beneficial and to continue to monitor Pt.  Dr. Sheliah HatchKinsinger called, notified of event. Informed of discussion with Neurosurgery. Discussed meds, PRN's, vitals and possible sedation medication and BP parameters. Order for Esmolol gtt and to continue to monitor.

## 2016-07-13 NOTE — Progress Notes (Signed)
Pt seen and examined. One episode of elevated ICP last night.  EXAM: Temp:  [97.9 F (36.6 C)-100.9 F (38.3 C)] 98.6 F (37 C) (01/11 0800) Pulse Rate:  [80-116] 80 (01/11 0800) Resp:  [0-20] 20 (01/11 0800) BP: (144-200)/(88-111) 162/104 (01/11 0800) SpO2:  [93 %-100 %] 98 % (01/11 0800) FiO2 (%):  [40 %] 40 % (01/11 0328) Weight:  [114.9 kg (253 lb 4.9 oz)] 114.9 kg (253 lb 4.9 oz) (01/11 0430) Intake/Output      01/10 0701 - 01/11 0700 01/11 0701 - 01/12 0700   I.V. (mL/kg) 4360.9 (38) 196.1 (1.7)   NG/GT 575 25   IV Piggyback 1416.7    Total Intake(mL/kg) 6352.6 (55.3) 221.1 (1.9)   Urine (mL/kg/hr) 2850 (1) 350 (1.7)   Emesis/NG output 400 (0.1)    Stool 0 (0)    Total Output 3250 350   Net +3102.6 -128.9        Stool Occurrence 1 x     Intubated, sedated, paralyzed PERRL  Stable Wean paralytics today; resume if ICPs elevated
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
## 2016-07-13 NOTE — Progress Notes (Addendum)
Inpatient Diabetes Program Recommendations  AACE/ADA: New Consensus Statement on Inpatient Glycemic Control (2015)  Target Ranges:  Prepandial:   less than 140 mg/dL      Peak postprandial:   less than 180 mg/dL (1-2 hours)      Critically ill patients:  140 - 180 mg/dL   Lab Results  Component Value Date   GLUCAP 214 (H) 07/13/2016    Review of Glycemic ControlResults for [name], Justin T (MRN [id number]) as of 07/13/2016 11:38  Ref. Range 07/12/2016 19:35 07/12/2016 23:46 07/13/2016 03:49 07/13/2016 08:09 07/13/2016 11:26  Glucose-Capillary Latest Ref Range: 65 - 99 mg/dL 578251 (H) 469233 (H) 629263 (H) 205 (H) 214 (H)   Inpatient Diabetes Program Recommendations:    Note CBG's greater than goal. Consider adding Novolog tube feed coverage 2 units q 4 hours.   Will follow.   Thanks, Beryl MeagerJenny Aubrii Sharpless, RN, BC-ADM Inpatient Diabetes Coordinator Pager [phone] (8a-5p)

## 2016-07-14 LAB — BLOOD GAS, ARTERIAL
Acid-Base Excess: 0.7 mmol/L (ref 0.0–2.0)
Bicarbonate: 24.9 mmol/L (ref 20.0–28.0)
Drawn by: 44135
FIO2: 40
O2 Saturation: 95.3 %
PATIENT TEMPERATURE: 98.6
PCO2 ART: 40.5 mmHg (ref 32.0–48.0)
PEEP: 5 cmH2O
PH ART: 7.405 (ref 7.350–7.450)
RATE: 22 resp/min
VT: 650 mL
pO2, Arterial: 80.5 mmHg — ABNORMAL LOW (ref 83.0–108.0)

## 2016-07-14 LAB — GLUCOSE, CAPILLARY
GLUCOSE-CAPILLARY: 238 mg/dL — AB (ref 65–99)
GLUCOSE-CAPILLARY: 226 mg/dL — AB (ref 65–99)
GLUCOSE-CAPILLARY: 191 mg/dL — AB (ref 65–99)
Glucose-Capillary: 204 mg/dL — ABNORMAL HIGH (ref 65–99)
Glucose-Capillary: 207 mg/dL — ABNORMAL HIGH (ref 65–99)
Glucose-Capillary: 211 mg/dL — ABNORMAL HIGH (ref 65–99)
Glucose-Capillary: 227 mg/dL — ABNORMAL HIGH (ref 65–99)

## 2016-07-14 LAB — CBC
HEMATOCRIT: 29.1 % — AB (ref 39.0–52.0)
HEMOGLOBIN: 9.5 g/dL — AB (ref 13.0–17.0)
MCH: 32.2 pg (ref 26.0–34.0)
MCHC: 32.6 g/dL (ref 30.0–36.0)
MCV: 98.6 fL (ref 78.0–100.0)
Platelets: 228 10*3/uL (ref 150–400)
RBC: 2.95 MIL/uL — AB (ref 4.22–5.81)
RDW: 13.3 % (ref 11.5–15.5)
WBC: 16.9 10*3/uL — ABNORMAL HIGH (ref 4.0–10.5)

## 2016-07-14 LAB — BASIC METABOLIC PANEL
Anion gap: 8 (ref 5–15)
BUN: 18 mg/dL (ref 6–20)
CHLORIDE: 105 mmol/L (ref 101–111)
CO2: 24 mmol/L (ref 22–32)
Calcium: 8.2 mg/dL — ABNORMAL LOW (ref 8.9–10.3)
Creatinine, Ser: 0.89 mg/dL (ref 0.61–1.24)
GFR calc Af Amer: >60 mL/min (ref >60)
GFR calc non Af Amer: >60 mL/min (ref >60)
Glucose, Bld: 229 mg/dL — ABNORMAL HIGH (ref 65–99)
POTASSIUM: 4 mmol/L (ref 3.5–5.1)
SODIUM: 137 mmol/L (ref 135–145)

## 2016-07-14 MED ORDER — DEXTROSE 5 % IV SOLN
2.0000 g | INTRAVENOUS | Status: AC
  Administered 2016-07-14 – 2016-07-21 (×8): 2 g via INTRAVENOUS
  Filled 2016-07-14 (×8): qty 2

## 2016-07-14 MED ORDER — CLONAZEPAM 1 MG PO TABS
2.0000 mg | ORAL_TABLET | Freq: Two times a day (BID) | ORAL | Status: DC
  Administered 2016-07-14 – 2016-07-15 (×3): 2 mg
  Filled 2016-07-14 (×3): qty 2

## 2016-07-14 MED ORDER — GUAIFENESIN 100 MG/5ML PO SOLN
15.0000 mL | Freq: Four times a day (QID) | ORAL | Status: DC
  Administered 2016-07-14 – 2016-08-01 (×71): 300 mg
  Filled 2016-07-14 (×75): qty 15

## 2016-07-14 MED ORDER — METOPROLOL TARTRATE 25 MG/10 ML ORAL SUSPENSION
50.0000 mg | Freq: Three times a day (TID) | ORAL | Status: DC
  Administered 2016-07-14 – 2016-07-17 (×9): 50 mg
  Filled 2016-07-14 (×11): qty 20

## 2016-07-14 MED ORDER — QUETIAPINE FUMARATE 200 MG PO TABS
200.0000 mg | ORAL_TABLET | Freq: Two times a day (BID) | ORAL | Status: DC
  Administered 2016-07-14 – 2016-07-15 (×3): 200 mg
  Filled 2016-07-14 (×3): qty 1
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
## 2016-07-14 NOTE — Progress Notes (Signed)
Pt seen and examined. Required paralytics within 30 minutes of discontinuation yesterday.  ICPs controlled overnight with paralytics.  EXAM: Temp:  [99 F (37.2 C)-100.9 F (38.3 C)] 100.4 F (38 C) (01/12 0700) Pulse Rate:  [89-113] 97 (01/12 0700) Resp:  [16-29] 22 (01/12 0700) BP: (123-177)/(73-110) 123/78 (01/12 0738) SpO2:  [93 %-100 %] 97 % (01/12 0740) FiO2 (%):  [40 %] 40 % (01/12 0741) Weight:  [117.5 kg (259 lb 0.7 oz)] 117.5 kg (259 lb 0.7 oz) (01/12 0600) Intake/Output      01/11 0701 - 01/12 0700 01/12 0701 - 01/13 0700   I.V. (mL/kg) 4682.6 (39.9)    NG/GT 1075.2    IV Piggyback 410    Total Intake(mL/kg) 6167.7 (52.5)    Urine (mL/kg/hr) 3200 (1.1)    Emesis/NG output     Stool     Total Output 3200     Net +2967.7           Intubated, sedated, paralyzed PERRL Attempt to wean paralytics again today.

## 2016-07-14 NOTE — Progress Notes (Signed)
Inpatient Diabetes Program Recommendations  AACE/ADA: New Consensus Statement on Inpatient Glycemic Control (2015)  Target Ranges:  Prepandial:   less than 140 mg/dL      Peak postprandial:   less than 180 mg/dL (1-2 hours)      Critically ill patients:  140 - 180 mg/dL   Results for [name], [name] (MRN [id number]) as of 07/14/2016 09:31  Ref. Range 07/12/2016 23:46 07/13/2016 03:49 07/13/2016 08:09 07/13/2016 11:26 07/13/2016 16:05 07/13/2016 20:27  Glucose-Capillary Latest Ref Range: 65 - 99 mg/dL 409233 (H) 811263 (H) 914205 (H) 214 (H) 215 (H) 186 (H)   Results for [name], [name] (MRN [id number]) as of 07/14/2016 09:31  Ref. Range 07/14/2016 00:06 07/14/2016 03:46 07/14/2016 07:58  Glucose-Capillary Latest Ref Range: 65 - 99 mg/dL 086207 (H) 578238 (H) 469211 (H)    Current Insulin Orders: Novolog Moderate Correction Scale/ SSI (0-15 units) Q4 hours      MD- Note patient receiving Pivot tube feeds at 60cc/hour.  Having glucose elevations >200 mg/dl.  Already getting Novolog SSI Q4 hours.  Please consider starting Novolog tube feed coverage as well to help with glucose elevations:  Novolog 3 units Q4 hours (hold if tube feeds held for any reason)     --Will follow patient during hospitalization--  Ambrose FinlandJeannine Johnston Kemyah Buser RN, MSN, CDE Diabetes Coordinator Inpatient Glycemic Control Team Team Pager: [phone] (8a-5p)

## 2016-07-15 ENCOUNTER — Inpatient Hospital Stay (HOSPITAL_COMMUNITY): Payer: Medicaid Other

## 2016-07-15 LAB — CBC
HEMATOCRIT: 28.5 % — AB (ref 39.0–52.0)
HEMOGLOBIN: 9.3 g/dL — AB (ref 13.0–17.0)
MCH: 32.7 pg (ref 26.0–34.0)
MCHC: 32.6 g/dL (ref 30.0–36.0)
MCV: 100.4 fL — ABNORMAL HIGH (ref 78.0–100.0)
Platelets: 271 10*3/uL (ref 150–400)
RBC: 2.84 MIL/uL — ABNORMAL LOW (ref 4.22–5.81)
RDW: 13.5 % (ref 11.5–15.5)
WBC: 19.3 10*3/uL — ABNORMAL HIGH (ref 4.0–10.5)

## 2016-07-15 LAB — BASIC METABOLIC PANEL
Anion gap: 5 (ref 5–15)
BUN: 14 mg/dL (ref 6–20)
CHLORIDE: 106 mmol/L (ref 101–111)
CO2: 24 mmol/L (ref 22–32)
CREATININE: 0.66 mg/dL (ref 0.61–1.24)
Calcium: 8.2 mg/dL — ABNORMAL LOW (ref 8.9–10.3)
GFR calc Af Amer: >60 mL/min (ref >60)
GFR calc non Af Amer: >60 mL/min (ref >60)
Glucose, Bld: 279 mg/dL — ABNORMAL HIGH (ref 65–99)
Potassium: 4 mmol/L (ref 3.5–5.1)
Sodium: 135 mmol/L (ref 135–145)

## 2016-07-15 LAB — GLUCOSE, CAPILLARY
GLUCOSE-CAPILLARY: 270 mg/dL — AB (ref 65–99)
GLUCOSE-CAPILLARY: 246 mg/dL — AB (ref 65–99)
GLUCOSE-CAPILLARY: 236 mg/dL — AB (ref 65–99)
Glucose-Capillary: 229 mg/dL — ABNORMAL HIGH (ref 65–99)
Glucose-Capillary: 222 mg/dL — ABNORMAL HIGH (ref 65–99)
Glucose-Capillary: 252 mg/dL — ABNORMAL HIGH (ref 65–99)

## 2016-07-15 MED ORDER — QUETIAPINE FUMARATE 200 MG PO TABS
200.0000 mg | ORAL_TABLET | Freq: Three times a day (TID) | ORAL | Status: DC
  Administered 2016-07-15 – 2016-07-29 (×41): 200 mg
  Filled 2016-07-15 (×42): qty 1

## 2016-07-15 MED ORDER — CLONAZEPAM 1 MG PO TABS
2.0000 mg | ORAL_TABLET | Freq: Three times a day (TID) | ORAL | Status: DC
  Administered 2016-07-15 – 2016-07-21 (×17): 2 mg
  Filled 2016-07-15 (×18): qty 2

## 2016-07-15 MED ORDER — PNEUMOCOCCAL VAC POLYVALENT 25 MCG/0.5ML IJ INJ
0.5000 mL | INJECTION | INTRAMUSCULAR | Status: AC
  Administered 2016-07-16: 0.5 mL via INTRAMUSCULAR
  Filled 2016-07-15: qty 0.5
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
## 2016-07-15 NOTE — Progress Notes (Signed)
Stable. ICP is controlled on sedation and paralytics.  Overall stable. Continue sedations and neuromuscular blockade. Potentially stop paralytics tomorrow and reassess.

## 2016-07-16 LAB — CBC
HEMATOCRIT: 28.8 % — AB (ref 39.0–52.0)
Hemoglobin: 9.2 g/dL — ABNORMAL LOW (ref 13.0–17.0)
MCH: 32.3 pg (ref 26.0–34.0)
MCHC: 31.9 g/dL (ref 30.0–36.0)
MCV: 101.1 fL — ABNORMAL HIGH (ref 78.0–100.0)
PLATELETS: 300 10*3/uL (ref 150–400)
RBC: 2.85 MIL/uL — ABNORMAL LOW (ref 4.22–5.81)
RDW: 13.4 % (ref 11.5–15.5)
WBC: 17.9 10*3/uL — AB (ref 4.0–10.5)

## 2016-07-16 LAB — CULTURE, BLOOD (ROUTINE X 2)
CULTURE: NO GROWTH
CULTURE: NO GROWTH

## 2016-07-16 LAB — GLUCOSE, CAPILLARY
GLUCOSE-CAPILLARY: 285 mg/dL — AB (ref 65–99)
GLUCOSE-CAPILLARY: 149 mg/dL — AB (ref 65–99)
GLUCOSE-CAPILLARY: 159 mg/dL — AB (ref 65–99)
Glucose-Capillary: 274 mg/dL — ABNORMAL HIGH (ref 65–99)
Glucose-Capillary: 191 mg/dL — ABNORMAL HIGH (ref 65–99)
Glucose-Capillary: 213 mg/dL — ABNORMAL HIGH (ref 65–99)

## 2016-07-16 LAB — BASIC METABOLIC PANEL
ANION GAP: 6 (ref 5–15)
BUN: 17 mg/dL (ref 6–20)
CALCIUM: 8.3 mg/dL — AB (ref 8.9–10.3)
CO2: 25 mmol/L (ref 22–32)
CREATININE: 0.73 mg/dL (ref 0.61–1.24)
Chloride: 103 mmol/L (ref 101–111)
Glucose, Bld: 285 mg/dL — ABNORMAL HIGH (ref 65–99)
Potassium: 3.8 mmol/L (ref 3.5–5.1)
SODIUM: 134 mmol/L — AB (ref 135–145)

## 2016-07-16 LAB — MRSA PCR SCREENING: MRSA BY PCR: NEGATIVE

## 2016-07-16 MED ORDER — POLYETHYLENE GLYCOL 3350 17 G PO PACK
17.0000 g | PACK | Freq: Every day | ORAL | Status: DC
  Administered 2016-07-16 – 2016-07-25 (×8): 17 g via ORAL
  Filled 2016-07-16 (×10): qty 1

## 2016-07-16 MED ORDER — INSULIN ASPART 100 UNIT/ML ~~LOC~~ SOLN
3.0000 [IU] | SUBCUTANEOUS | Status: AC
  Administered 2016-07-16 (×4): 3 [IU] via SUBCUTANEOUS

## 2016-07-16 NOTE — Progress Notes (Signed)
No new issues or problems overnight. ICPs remain controlled. Beginning to wean off medicine. Check follow-up head CT scan tomorrow.
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
## 2016-07-17 ENCOUNTER — Inpatient Hospital Stay (HOSPITAL_COMMUNITY): Payer: Medicaid Other

## 2016-07-17 LAB — BLOOD GAS, ARTERIAL
Acid-Base Excess: 2.6 mmol/L — ABNORMAL HIGH (ref 0.0–2.0)
Bicarbonate: 26.8 mmol/L (ref 20.0–28.0)
DRAWN BY: 33100
FIO2: 40
MECHVT: 650 mL
O2 Saturation: 97 %
PEEP: 5 cmH2O
PH ART: 7.414 (ref 7.350–7.450)
Patient temperature: 99
RATE: 22 resp/min
pCO2 arterial: 42.9 mmHg (ref 32.0–48.0)
pO2, Arterial: 92.9 mmHg (ref 83.0–108.0)

## 2016-07-17 LAB — COMPREHENSIVE METABOLIC PANEL
ALBUMIN: 1.8 g/dL — AB (ref 3.5–5.0)
ALK PHOS: 61 U/L (ref 38–126)
ALT: 24 U/L (ref 17–63)
AST: 19 U/L (ref 15–41)
Anion gap: 8 (ref 5–15)
BUN: 15 mg/dL (ref 6–20)
CALCIUM: 8.4 mg/dL — AB (ref 8.9–10.3)
CO2: 26 mmol/L (ref 22–32)
CREATININE: 0.64 mg/dL (ref 0.61–1.24)
Chloride: 102 mmol/L (ref 101–111)
GFR calc Af Amer: >60 mL/min (ref >60)
GFR calc non Af Amer: >60 mL/min (ref >60)
GLUCOSE: 249 mg/dL — AB (ref 65–99)
Potassium: 3.9 mmol/L (ref 3.5–5.1)
SODIUM: 136 mmol/L (ref 135–145)
TOTAL PROTEIN: 5.6 g/dL — AB (ref 6.5–8.1)
Total Bilirubin: 0.6 mg/dL (ref 0.3–1.2)

## 2016-07-17 LAB — PREALBUMIN: Prealbumin: 18.7 mg/dL (ref 18–38)

## 2016-07-17 LAB — GLUCOSE, CAPILLARY
GLUCOSE-CAPILLARY: 282 mg/dL — AB (ref 65–99)
GLUCOSE-CAPILLARY: 139 mg/dL — AB (ref 65–99)
Glucose-Capillary: 239 mg/dL — ABNORMAL HIGH (ref 65–99)
Glucose-Capillary: 188 mg/dL — ABNORMAL HIGH (ref 65–99)
Glucose-Capillary: 210 mg/dL — ABNORMAL HIGH (ref 65–99)

## 2016-07-17 LAB — CBC
HEMATOCRIT: 28.5 % — AB (ref 39.0–52.0)
HEMOGLOBIN: 9.2 g/dL — AB (ref 13.0–17.0)
MCH: 32.4 pg (ref 26.0–34.0)
MCHC: 32.3 g/dL (ref 30.0–36.0)
MCV: 100.4 fL — ABNORMAL HIGH (ref 78.0–100.0)
Platelets: 320 10*3/uL (ref 150–400)
RBC: 2.84 MIL/uL — AB (ref 4.22–5.81)
RDW: 13.7 % (ref 11.5–15.5)
WBC: 16.3 10*3/uL — ABNORMAL HIGH (ref 4.0–10.5)

## 2016-07-17 MED ORDER — LABETALOL HCL 5 MG/ML IV SOLN
INTRAVENOUS | Status: AC
  Filled 2016-07-17: qty 4

## 2016-07-17 MED ORDER — METOPROLOL TARTRATE 25 MG/10 ML ORAL SUSPENSION
100.0000 mg | Freq: Two times a day (BID) | ORAL | Status: DC
  Administered 2016-07-17: 100 mg
  Filled 2016-07-17: qty 40

## 2016-07-17 MED ORDER — LABETALOL HCL 5 MG/ML IV SOLN
10.0000 mg | Freq: Once | INTRAVENOUS | Status: AC
  Administered 2016-07-17: 10 mg via INTRAVENOUS

## 2016-07-17 MED ORDER — INSULIN ASPART 100 UNIT/ML ~~LOC~~ SOLN
3.0000 [IU] | SUBCUTANEOUS | Status: AC
  Administered 2016-07-17 (×2): 3 [IU] via SUBCUTANEOUS

## 2016-07-17 MED ORDER — VITAL HIGH PROTEIN PO LIQD
1000.0000 mL | ORAL | Status: DC
  Administered 2016-07-17 – 2016-07-18 (×2): 1000 mL

## 2016-07-17 MED ORDER — LABETALOL HCL 5 MG/ML IV SOLN
10.0000 mg | INTRAVENOUS | Status: DC | PRN
  Administered 2016-07-17 – 2016-08-10 (×18): 10 mg via INTRAVENOUS
  Filled 2016-07-17 (×19): qty 4

## 2016-07-17 MED ORDER — METOPROLOL TARTRATE 25 MG/10 ML ORAL SUSPENSION
50.0000 mg | Freq: Once | ORAL | Status: AC
  Administered 2016-07-17: 50 mg
  Filled 2016-07-17: qty 20
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
## 2016-07-17 NOTE — Progress Notes (Signed)
ICP elevated as high as 30s, patient shivering, elevated HR/Temp/BP after Nimbex turned off. Patient given sedation bolus with some improvement in ICP but remained low 20s so Nimbex promptly restarted. Tylenol and lopressor given; ice packs also applied. ICP decreased to 6-9. Neurosurgeon and trauma MD updated. Labetalol ordered and given. Patient no longer shivering and VS improving. Will continue to monitor.

## 2016-07-17 NOTE — Progress Notes (Addendum)
Pt seen and examined. No issues overnight.  ICPs well controlled.  EXAM: Temp:  [98.2 F (36.8 C)-100.6 F (38.1 C)] 99.3 F (37.4 C) (01/15 1100) Pulse Rate:  [90-125] 101 (01/15 1100) Resp:  [18-27] 22 (01/15 1100) BP: (128-186)/(76-114) 155/99 (01/15 1100) SpO2:  [96 %-100 %] 98 % (01/15 1120) FiO2 (%):  [40 %] 40 % (01/15 1120) Weight:  [124.8 kg (275 lb 2.2 oz)] 124.8 kg (275 lb 2.2 oz) (01/15 0500) Intake/Output      01/14 0701 - 01/15 0700 01/15 0701 - 01/16 0700   I.V. (mL/kg) 4632.1 (37.1) 727 (5.8)   NG/GT 1440 367.7   IV Piggyback 260    Total Intake(mL/kg) 6332.1 (50.7) 1094.7 (8.8)   Urine (mL/kg/hr) 4940 (1.6) 1050 (1.7)   Total Output 4940 1050   Net +1392.1 +44.7         Intubated, sedated PERRL Extends lower extremities in response to painful stimulus CT shows stable hemorrhage  Stable on very low dose of paralytics Continue to wean paralytics

## 2016-07-17 NOTE — Progress Notes (Signed)
Towards beginning of shift, patient's ICP increased to 20s and there was a plan to go to CT tonight as well. Because of those to things, the nimbex was increased back up to 485mcg/min. ICPs decreased back down to an average of 5. Went to CT around 0100. Nimbex was then decreased back down to 393mcg/min around 0234. Titrating both esmolol and nimbex as patient tolerates. Will continue to monitor.

## 2016-07-18 ENCOUNTER — Inpatient Hospital Stay (HOSPITAL_COMMUNITY): Payer: Medicaid Other

## 2016-07-18 LAB — BLOOD GAS, ARTERIAL
Acid-Base Excess: 4.1 mmol/L — ABNORMAL HIGH (ref 0.0–2.0)
Bicarbonate: 28.3 mmol/L — ABNORMAL HIGH (ref 20.0–28.0)
Drawn by: 34560
FIO2: 40
MECHVT: 650 mL
O2 Saturation: 94.1 %
PEEP: 5 cmH2O
Patient temperature: 99.9
RATE: 22 resp/min
pCO2 arterial: 45.7 mmHg (ref 32.0–48.0)
pH, Arterial: 7.412 (ref 7.350–7.450)
pO2, Arterial: 77 mmHg — ABNORMAL LOW (ref 83.0–108.0)

## 2016-07-18 LAB — GLUCOSE, CAPILLARY
GLUCOSE-CAPILLARY: 138 mg/dL — AB (ref 65–99)
GLUCOSE-CAPILLARY: 139 mg/dL — AB (ref 65–99)
GLUCOSE-CAPILLARY: 143 mg/dL — AB (ref 65–99)
GLUCOSE-CAPILLARY: 150 mg/dL — AB (ref 65–99)
GLUCOSE-CAPILLARY: 154 mg/dL — AB (ref 65–99)
Glucose-Capillary: 145 mg/dL — ABNORMAL HIGH (ref 65–99)
Glucose-Capillary: 154 mg/dL — ABNORMAL HIGH (ref 65–99)
Glucose-Capillary: 154 mg/dL — ABNORMAL HIGH (ref 65–99)

## 2016-07-18 LAB — CBC
HEMATOCRIT: 27.1 % — AB (ref 39.0–52.0)
HEMOGLOBIN: 8.7 g/dL — AB (ref 13.0–17.0)
MCH: 32.2 pg (ref 26.0–34.0)
MCHC: 32.1 g/dL (ref 30.0–36.0)
MCV: 100.4 fL — ABNORMAL HIGH (ref 78.0–100.0)
Platelets: 364 10*3/uL (ref 150–400)
RBC: 2.7 MIL/uL — AB (ref 4.22–5.81)
RDW: 13.7 % (ref 11.5–15.5)
WBC: 20.7 10*3/uL — AB (ref 4.0–10.5)

## 2016-07-18 LAB — BASIC METABOLIC PANEL
Anion gap: 9 (ref 5–15)
BUN: 14 mg/dL (ref 6–20)
CO2: 27 mmol/L (ref 22–32)
Calcium: 8.4 mg/dL — ABNORMAL LOW (ref 8.9–10.3)
Chloride: 98 mmol/L — ABNORMAL LOW (ref 101–111)
Creatinine, Ser: 0.61 mg/dL (ref 0.61–1.24)
GFR calc Af Amer: >60 mL/min (ref >60)
GFR calc non Af Amer: >60 mL/min (ref >60)
Glucose, Bld: 140 mg/dL — ABNORMAL HIGH (ref 65–99)
Potassium: 3.9 mmol/L (ref 3.5–5.1)
Sodium: 134 mmol/L — ABNORMAL LOW (ref 135–145)

## 2016-07-18 MED ORDER — SODIUM CHLORIDE 0.9 % IV SOLN
INTRAVENOUS | Status: DC
  Administered 2016-07-18 – 2016-07-23 (×6): via INTRAVENOUS

## 2016-07-18 MED ORDER — DEXMEDETOMIDINE HCL IN NACL 200 MCG/50ML IV SOLN
0.4000 ug/kg/h | INTRAVENOUS | Status: DC
  Administered 2016-07-18: 0.4 ug/kg/h via INTRAVENOUS
  Administered 2016-07-18: 1.2 ug/kg/h via INTRAVENOUS
  Administered 2016-07-18: 0.5 ug/kg/h via INTRAVENOUS
  Administered 2016-07-18: 0.8 ug/kg/h via INTRAVENOUS
  Administered 2016-07-18: 0.9 ug/kg/h via INTRAVENOUS
  Administered 2016-07-18: 0.5 ug/kg/h via INTRAVENOUS
  Administered 2016-07-18: 0.7 ug/kg/h via INTRAVENOUS
  Administered 2016-07-18: 0.8 ug/kg/h via INTRAVENOUS
  Administered 2016-07-18 – 2016-07-22 (×28): 1.2 ug/kg/h via INTRAVENOUS
  Administered 2016-07-22: 1.1 ug/kg/h via INTRAVENOUS
  Administered 2016-07-22 – 2016-07-23 (×4): 1.2 ug/kg/h via INTRAVENOUS
  Administered 2016-07-23: 1.1 ug/kg/h via INTRAVENOUS
  Administered 2016-07-23: 1.2 ug/kg/h via INTRAVENOUS
  Administered 2016-07-23: 1 ug/kg/h via INTRAVENOUS
  Administered 2016-07-23 (×2): 1.2 ug/kg/h via INTRAVENOUS
  Administered 2016-07-23: 1 ug/kg/h via INTRAVENOUS
  Administered 2016-07-23 – 2016-07-24 (×3): 1.2 ug/kg/h via INTRAVENOUS
  Administered 2016-07-24: 1 ug/kg/h via INTRAVENOUS
  Administered 2016-07-24 (×3): 1.2 ug/kg/h via INTRAVENOUS
  Administered 2016-07-24: 1.1 ug/kg/h via INTRAVENOUS
  Administered 2016-07-25: 1 ug/kg/h via INTRAVENOUS
  Administered 2016-07-25: 1.1 ug/kg/h via INTRAVENOUS
  Administered 2016-07-25 (×2): 0.9 ug/kg/h via INTRAVENOUS
  Administered 2016-07-25: 1.1 ug/kg/h via INTRAVENOUS
  Administered 2016-07-25: 1.2 ug/kg/h via INTRAVENOUS
  Administered 2016-07-25: 0.9 ug/kg/h via INTRAVENOUS
  Administered 2016-07-25: 1.1 ug/kg/h via INTRAVENOUS
  Administered 2016-07-25: 0.9 ug/kg/h via INTRAVENOUS
  Administered 2016-07-26 (×3): 0.7 ug/kg/h via INTRAVENOUS
  Administered 2016-07-26: 0.8 ug/kg/h via INTRAVENOUS
  Administered 2016-07-27: 0.7 ug/kg/h via INTRAVENOUS
  Administered 2016-07-27: 0.6 ug/kg/h via INTRAVENOUS
  Administered 2016-07-27 (×2): 0.7 ug/kg/h via INTRAVENOUS
  Administered 2016-07-28: 0.6 ug/kg/h via INTRAVENOUS
  Administered 2016-07-28: 0.3 ug/kg/h via INTRAVENOUS
  Administered 2016-07-28: 0.5 ug/kg/h via INTRAVENOUS
  Administered 2016-07-29: 0.4 ug/kg/h via INTRAVENOUS
  Administered 2016-07-29: 0.6 ug/kg/h via INTRAVENOUS
  Administered 2016-07-29 – 2016-07-30 (×3): 0.4 ug/kg/h via INTRAVENOUS
  Administered 2016-07-30: 0.6 ug/kg/h via INTRAVENOUS
  Administered 2016-07-31 – 2016-08-04 (×13): 0.4 ug/kg/h via INTRAVENOUS
  Administered 2016-08-04 – 2016-08-05 (×4): 0.6 ug/kg/h via INTRAVENOUS
  Administered 2016-08-05: 0.3 ug/kg/h via INTRAVENOUS
  Administered 2016-08-05: 0.6 ug/kg/h via INTRAVENOUS
  Administered 2016-08-05 (×2): 0.5 ug/kg/h via INTRAVENOUS
  Administered 2016-08-05: 0.6 ug/kg/h via INTRAVENOUS
  Administered 2016-08-06: 0.1 ug/kg/h via INTRAVENOUS
  Administered 2016-08-06 (×2): 0.5 ug/kg/h via INTRAVENOUS
  Administered 2016-08-07 (×2): 0.4 ug/kg/h via INTRAVENOUS
  Administered 2016-08-07: 0.2 ug/kg/h via INTRAVENOUS
  Administered 2016-08-08: 0.4 ug/kg/h via INTRAVENOUS
  Filled 2016-07-18: qty 100
  Filled 2016-07-18 (×2): qty 50
  Filled 2016-07-18: qty 200
  Filled 2016-07-18 (×2): qty 100
  Filled 2016-07-18 (×2): qty 50
  Filled 2016-07-18 (×2): qty 100
  Filled 2016-07-18: qty 50
  Filled 2016-07-18: qty 200
  Filled 2016-07-18 (×2): qty 100
  Filled 2016-07-18: qty 50
  Filled 2016-07-18 (×2): qty 100
  Filled 2016-07-18: qty 50
  Filled 2016-07-18 (×5): qty 100
  Filled 2016-07-18: qty 50
  Filled 2016-07-18: qty 200
  Filled 2016-07-18 (×2): qty 50
  Filled 2016-07-18 (×2): qty 100
  Filled 2016-07-18: qty 200
  Filled 2016-07-18: qty 50
  Filled 2016-07-18 (×3): qty 100
  Filled 2016-07-18 (×2): qty 50
  Filled 2016-07-18 (×3): qty 100
  Filled 2016-07-18: qty 50
  Filled 2016-07-18 (×5): qty 100
  Filled 2016-07-18: qty 50
  Filled 2016-07-18 (×2): qty 100
  Filled 2016-07-18: qty 200
  Filled 2016-07-18: qty 100
  Filled 2016-07-18: qty 50
  Filled 2016-07-18 (×2): qty 100
  Filled 2016-07-18: qty 300
  Filled 2016-07-18: qty 50
  Filled 2016-07-18 (×11): qty 100
  Filled 2016-07-18: qty 50
  Filled 2016-07-18: qty 100
  Filled 2016-07-18: qty 200
  Filled 2016-07-18: qty 100
  Filled 2016-07-18: qty 50
  Filled 2016-07-18: qty 100
  Filled 2016-07-18: qty 200
  Filled 2016-07-18 (×4): qty 100
  Filled 2016-07-18 (×2): qty 50
  Filled 2016-07-18 (×4): qty 100
  Filled 2016-07-18: qty 50
  Filled 2016-07-18 (×3): qty 100
  Filled 2016-07-18: qty 50
  Filled 2016-07-18 (×5): qty 100
  Filled 2016-07-18: qty 50
  Filled 2016-07-18 (×2): qty 100
  Filled 2016-07-18: qty 200
  Filled 2016-07-18 (×3): qty 50
  Filled 2016-07-18: qty 100

## 2016-07-18 MED ORDER — GLUCERNA 1.2 CAL PO LIQD
1000.0000 mL | ORAL | Status: DC
Start: 2016-07-18 — End: 2016-07-21
  Administered 2016-07-18 – 2016-07-21 (×4): 1000 mL
  Filled 2016-07-18 (×7): qty 1000

## 2016-07-18 MED ORDER — PRO-STAT SUGAR FREE PO LIQD
30.0000 mL | Freq: Every day | ORAL | Status: DC
  Administered 2016-07-18: 30 mL
  Administered 2016-07-18: 17:00:00
  Administered 2016-07-19 – 2016-08-03 (×74): 30 mL
  Filled 2016-07-18 (×75): qty 30

## 2016-07-18 MED ORDER — METOPROLOL TARTRATE 25 MG/10 ML ORAL SUSPENSION
100.0000 mg | Freq: Three times a day (TID) | ORAL | Status: DC
  Administered 2016-07-18 – 2016-07-24 (×16): 100 mg
  Administered 2016-07-24: 50 mg
  Administered 2016-07-24 – 2016-08-18 (×73): 100 mg
  Filled 2016-07-18 (×100): qty 40

## 2016-07-18 NOTE — Progress Notes (Signed)
Pt seen and examined. ICPs still up when Nimbex off.  EXAM: Temp:  [99.3 F (37.4 C)-101.3 F (38.5 C)] 99.7 F (37.6 C) (01/16 0900) Pulse Rate:  [94-133] 107 (01/16 0900) Resp:  [21-32] 22 (01/16 0900) BP: (103-204)/(50-122) 148/68 (01/16 0900) SpO2:  [94 %-100 %] 97 % (01/16 0900) FiO2 (%):  [30 %-40 %] 40 % (01/16 0747) Weight:  [122.6 kg (270 lb 4.5 oz)] 122.6 kg (270 lb 4.5 oz) (01/16 0437) Intake/Output      01/15 0701 - 01/16 0700 01/16 0701 - 01/17 0700   I.V. (mL/kg) 4200.9 (34.3) 525.9 (4.3)   NG/GT 1227.7 120   IV Piggyback 210    Total Intake(mL/kg) 5638.6 (46) 645.9 (5.3)   Urine (mL/kg/hr) 6240 (2.1) 1600 (3.7)   Stool 0 (0)    Total Output 6240 1600   Net -601.4 -954.2        Stool Occurrence 1 x     Intubated, sedated PERRL Weakly withdraws throughout Continue ICP monitoring Wean paralytics as able
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
## 2016-07-18 NOTE — Progress Notes (Signed)
When performing shift assessment and pain stimulation, patient's ICP increased to 50s and he began coughing hard. Performed ETT suctioning. Dilaudid was given, precedex was increased and eventually maxed out to get ICP down. Mannitol was not needed as it resolved with more sedation. Will keep patient maxed out on precedex at this time and continue to monitor.
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
## 2016-07-19 ENCOUNTER — Inpatient Hospital Stay (HOSPITAL_COMMUNITY): Payer: Medicaid Other

## 2016-07-19 LAB — CBC
HCT: 25.6 % — ABNORMAL LOW (ref 39.0–52.0)
Hemoglobin: 8.3 g/dL — ABNORMAL LOW (ref 13.0–17.0)
MCH: 32.9 pg (ref 26.0–34.0)
MCHC: 32.4 g/dL (ref 30.0–36.0)
MCV: 101.6 fL — AB (ref 78.0–100.0)
PLATELETS: 332 10*3/uL (ref 150–400)
RBC: 2.52 MIL/uL — AB (ref 4.22–5.81)
RDW: 13.7 % (ref 11.5–15.5)
WBC: 17.4 10*3/uL — ABNORMAL HIGH (ref 4.0–10.5)

## 2016-07-19 LAB — BASIC METABOLIC PANEL
Anion gap: 7 (ref 5–15)
BUN: 16 mg/dL (ref 6–20)
CO2: 26 mmol/L (ref 22–32)
Calcium: 8.7 mg/dL — ABNORMAL LOW (ref 8.9–10.3)
Chloride: 103 mmol/L (ref 101–111)
Creatinine, Ser: 0.73 mg/dL (ref 0.61–1.24)
GFR calc Af Amer: >60 mL/min (ref >60)
GFR calc non Af Amer: >60 mL/min (ref >60)
GLUCOSE: 149 mg/dL — AB (ref 65–99)
POTASSIUM: 4.3 mmol/L (ref 3.5–5.1)
Sodium: 136 mmol/L (ref 135–145)

## 2016-07-19 LAB — BLOOD GAS, ARTERIAL
ACID-BASE EXCESS: 3.2 mmol/L — AB (ref 0.0–2.0)
Bicarbonate: 27.7 mmol/L (ref 20.0–28.0)
Drawn by: 345601
FIO2: 30
O2 Saturation: 95.2 %
PEEP/CPAP: 5 cmH2O
Patient temperature: 98.2
RATE: 16 resp/min
VT: 650 mL
pCO2 arterial: 45.1 mmHg (ref 32.0–48.0)
pH, Arterial: 7.403 (ref 7.350–7.450)
pO2, Arterial: 83.1 mmHg (ref 83.0–108.0)

## 2016-07-19 LAB — GLUCOSE, CAPILLARY
GLUCOSE-CAPILLARY: 165 mg/dL — AB (ref 65–99)
GLUCOSE-CAPILLARY: 162 mg/dL — AB (ref 65–99)
Glucose-Capillary: 121 mg/dL — ABNORMAL HIGH (ref 65–99)
Glucose-Capillary: 126 mg/dL — ABNORMAL HIGH (ref 65–99)
Glucose-Capillary: 140 mg/dL — ABNORMAL HIGH (ref 65–99)
Glucose-Capillary: 164 mg/dL — ABNORMAL HIGH (ref 65–99)

## 2016-07-19 MED ORDER — FUROSEMIDE 10 MG/ML IJ SOLN
20.0000 mg | Freq: Once | INTRAMUSCULAR | Status: AC
  Administered 2016-07-19: 20 mg via INTRAVENOUS
  Filled 2016-07-19: qty 2

## 2016-07-19 NOTE — Progress Notes (Signed)
Pt seen and examined. No issues overnight.  ICPs elevated with stimulation but resolve quickly.  EXAM: Temp:  [97.2 F (36.2 C)-100.8 F (38.2 C)] 98.1 F (36.7 C) (01/17 1100) Pulse Rate:  [68-121] 84 (01/17 1100) Resp:  [16-32] 16 (01/17 1100) BP: (103-161)/(59-96) 128/86 (01/17 1100) SpO2:  [93 %-100 %] 97 % (01/17 1100) FiO2 (%):  [30 %] 30 % (01/17 0825) Weight:  [120.6 kg (265 lb 14 oz)] 120.6 kg (265 lb 14 oz) (01/17 0500) Intake/Output      01/16 0701 - 01/17 0700 01/17 0701 - 01/18 0700   I.V. (mL/kg) 4035.4 (33.5) 955.5 (7.9)   NG/GT 1116 275   IV Piggyback 210    Total Intake(mL/kg) 5361.4 (44.5) 1230.5 (10.2)   Urine (mL/kg/hr) 4885 (1.7) 2150 (3.2)   Stool 0 (0)    Total Output 4885 2150   Net +476.4 -919.6        Stool Occurrence 1 x     Intubated, sedated PERRL Withdraws throughout  Stable ICP monitor removed Continue supportive care Ok for lovenox starting tomorrow Feel free to call with questions
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
## 2016-07-19 NOTE — Progress Notes (Signed)
RT note- ETT at 22cm with cuff leak, ETT advanced back to 25, BBS equal. RN notified

## 2016-07-20 ENCOUNTER — Inpatient Hospital Stay (HOSPITAL_COMMUNITY): Payer: Medicaid Other

## 2016-07-20 LAB — BLOOD GAS, ARTERIAL
ACID-BASE EXCESS: 2.9 mmol/L — AB (ref 0.0–2.0)
Bicarbonate: 27.5 mmol/L (ref 20.0–28.0)
DRAWN BY: 345601
FIO2: 30
MECHVT: 650 mL
O2 Saturation: 93.5 %
PEEP/CPAP: 5 cmH2O
PO2 ART: 72.8 mmHg — AB (ref 83.0–108.0)
Patient temperature: 98.6
RATE: 16 resp/min
pCO2 arterial: 46.3 mmHg (ref 32.0–48.0)
pH, Arterial: 7.391 (ref 7.350–7.450)

## 2016-07-20 LAB — CBC WITH DIFFERENTIAL/PLATELET
BAND NEUTROPHILS: 2 %
BASOS ABS: 0 10*3/uL (ref 0.0–0.1)
BLASTS: 0 %
Basophils Relative: 0 %
EOS PCT: 3 %
Eosinophils Absolute: 0.5 10*3/uL (ref 0.0–0.7)
HEMATOCRIT: 25.6 % — AB (ref 39.0–52.0)
Hemoglobin: 8.2 g/dL — ABNORMAL LOW (ref 13.0–17.0)
LYMPHS ABS: 1.7 10*3/uL (ref 0.7–4.0)
LYMPHS PCT: 11 %
MCH: 32.4 pg (ref 26.0–34.0)
MCHC: 32 g/dL (ref 30.0–36.0)
MCV: 101.2 fL — AB (ref 78.0–100.0)
METAMYELOCYTES PCT: 0 %
MONOS PCT: 4 %
Monocytes Absolute: 0.6 10*3/uL (ref 0.1–1.0)
Myelocytes: 0 %
NEUTROS ABS: 12.6 10*3/uL — AB (ref 1.7–7.7)
Neutrophils Relative %: 80 %
OTHER: 0 %
PLATELETS: 353 10*3/uL (ref 150–400)
Promyelocytes Absolute: 0 %
RBC: 2.53 MIL/uL — AB (ref 4.22–5.81)
RDW: 13.5 % (ref 11.5–15.5)
WBC: 15.4 10*3/uL — AB (ref 4.0–10.5)
nRBC: 0 /100 WBC

## 2016-07-20 LAB — BASIC METABOLIC PANEL
ANION GAP: 6 (ref 5–15)
BUN: 17 mg/dL (ref 6–20)
CALCIUM: 8.3 mg/dL — AB (ref 8.9–10.3)
CO2: 27 mmol/L (ref 22–32)
CREATININE: 0.66 mg/dL (ref 0.61–1.24)
Chloride: 105 mmol/L (ref 101–111)
GFR calc Af Amer: >60 mL/min (ref >60)
GLUCOSE: 137 mg/dL — AB (ref 65–99)
Potassium: 3.8 mmol/L (ref 3.5–5.1)
Sodium: 138 mmol/L (ref 135–145)

## 2016-07-20 LAB — GLUCOSE, CAPILLARY
GLUCOSE-CAPILLARY: 126 mg/dL — AB (ref 65–99)
GLUCOSE-CAPILLARY: 152 mg/dL — AB (ref 65–99)
Glucose-Capillary: 110 mg/dL — ABNORMAL HIGH (ref 65–99)
Glucose-Capillary: 136 mg/dL — ABNORMAL HIGH (ref 65–99)
Glucose-Capillary: 145 mg/dL — ABNORMAL HIGH (ref 65–99)
Glucose-Capillary: 155 mg/dL — ABNORMAL HIGH (ref 65–99)

## 2016-07-20 MED ORDER — ENOXAPARIN SODIUM 40 MG/0.4ML ~~LOC~~ SOLN
40.0000 mg | SUBCUTANEOUS | Status: DC
  Administered 2016-07-20 – 2016-07-26 (×7): 40 mg via SUBCUTANEOUS
  Filled 2016-07-20 (×7): qty 0.4

## 2016-07-20 MED ORDER — MAGNESIUM CITRATE PO SOLN
0.5000 | Freq: Once | ORAL | Status: AC
  Administered 2016-07-20: 0.5
  Filled 2016-07-20: qty 296

## 2016-07-20 MED ORDER — FUROSEMIDE 10 MG/ML IJ SOLN
40.0000 mg | Freq: Once | INTRAMUSCULAR | Status: AC
  Administered 2016-07-20: 40 mg via INTRAVENOUS
  Filled 2016-07-20: qty 4
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
## 2016-07-20 NOTE — Progress Notes (Signed)
Pt weaned from paralytics; continue to attempt to wean sedation.  ICP monitor removed yesterday, per neuro.  Pt's fiance and father at bedside; discussed Case Manager role and offered support with eventual discharge needs.  Family appreciative of visit.    Quintella BatonJulie W. Dung Prien, RN, BSN  Trauma/Neuro ICU Case Manager [phone]

## 2016-07-20 NOTE — Progress Notes (Signed)
Visited w/ 2 family members in rm, providing emotional/spiritual support and prayer. Family seemed upbeat, and advised they can ask nurse to page for a chaplain if they desire. Chaplain available for f/u.   07/20/16 1200  Clinical Encounter Type  Visited With Patient and family together  Visit Type Follow-up;Psychological support;Spiritual support;Social support;Critical Care  Referral From Chaplain  Spiritual Encounters  Spiritual Needs Prayer;Emotional  Stress Factors  Patient Stress Factors Health changes;Loss of control  Family Stress Factors Family relationships;Health changes;Loss of control  . Ephraim Hamburgerynthia A Kaion Tisdale, Chaplain

## 2016-07-21 ENCOUNTER — Inpatient Hospital Stay (HOSPITAL_COMMUNITY): Payer: Medicaid Other

## 2016-07-21 LAB — GLUCOSE, CAPILLARY
GLUCOSE-CAPILLARY: 133 mg/dL — AB (ref 65–99)
GLUCOSE-CAPILLARY: 184 mg/dL — AB (ref 65–99)
Glucose-Capillary: 167 mg/dL — ABNORMAL HIGH (ref 65–99)
Glucose-Capillary: 109 mg/dL — ABNORMAL HIGH (ref 65–99)
Glucose-Capillary: 121 mg/dL — ABNORMAL HIGH (ref 65–99)
Glucose-Capillary: 139 mg/dL — ABNORMAL HIGH (ref 65–99)

## 2016-07-21 LAB — CBC
HCT: 38.5 % — ABNORMAL LOW (ref 39.0–52.0)
HEMOGLOBIN: 12.3 g/dL — AB (ref 13.0–17.0)
MCH: 32.3 pg (ref 26.0–34.0)
MCHC: 31.9 g/dL (ref 30.0–36.0)
MCV: 101 fL — ABNORMAL HIGH (ref 78.0–100.0)
Platelets: 221 10*3/uL (ref 150–400)
RBC: 3.81 MIL/uL — AB (ref 4.22–5.81)
RDW: 13.3 % (ref 11.5–15.5)
WBC: 7.9 10*3/uL (ref 4.0–10.5)

## 2016-07-21 LAB — BASIC METABOLIC PANEL
ANION GAP: 7 (ref 5–15)
BUN: 16 mg/dL (ref 6–20)
CO2: 25 mmol/L (ref 22–32)
Calcium: 7.6 mg/dL — ABNORMAL LOW (ref 8.9–10.3)
Chloride: 104 mmol/L (ref 101–111)
Creatinine, Ser: 0.6 mg/dL — ABNORMAL LOW (ref 0.61–1.24)
GFR calc non Af Amer: >60 mL/min (ref >60)
Glucose, Bld: 122 mg/dL — ABNORMAL HIGH (ref 65–99)
POTASSIUM: 3.9 mmol/L (ref 3.5–5.1)
SODIUM: 136 mmol/L (ref 135–145)

## 2016-07-21 MED ORDER — CLONAZEPAM 1 MG PO TABS
2.0000 mg | ORAL_TABLET | Freq: Two times a day (BID) | ORAL | Status: DC
  Administered 2016-07-21 – 2016-07-22 (×3): 2 mg
  Filled 2016-07-21 (×3): qty 2

## 2016-07-21 MED ORDER — GLUCERNA 1.2 CAL PO LIQD
1000.0000 mL | ORAL | Status: DC
  Administered 2016-07-21 – 2016-07-25 (×6): 1000 mL
  Filled 2016-07-21 (×12): qty 1000

## 2016-07-21 MED ORDER — FUROSEMIDE 10 MG/ML IJ SOLN
40.0000 mg | Freq: Once | INTRAMUSCULAR | Status: AC
  Administered 2016-07-21: 40 mg via INTRAVENOUS
  Filled 2016-07-21: qty 4

## 2016-07-21 NOTE — Progress Notes (Signed)
Follow up - Trauma and Critical Care  Patient Details:    [name] is an 36 y.o. male.  Lines/tubes : Airway 7.5 mm (Active)  Secured at (cm) 25 cm 07/21/2016  7:17 AM  Measured From Lips 07/21/2016  7:17 AM  Secured Location Center 07/21/2016  7:17 AM  Secured By [employer] 07/21/2016  7:17 AM  Tube Holder Repositioned Yes 07/21/2016  7:17 AM  Cuff Pressure (cm H2O) 28 cm H2O 07/21/2016  3:51 AM  Site Condition Dry 07/21/2016  7:17 AM     PICC Triple Lumen 07/09/16 PICC Right Brachial 44 cm 0 cm (Active)  Indication for Insertion or Continuance of Line Prolonged intravenous therapies 07/21/2016  8:00 AM  Exposed Catheter (cm) 0 cm 07/09/2016  2:00 PM  Site Assessment Clean;Dry;Intact 07/21/2016  8:00 AM  Lumen #1 Status Infusing;Flushed;Blood return noted 07/21/2016  8:00 AM  Lumen #2 Status Infusing;Flushed;Blood return noted 07/21/2016  8:00 AM  Lumen #3 Status Infusing 07/21/2016  8:00 AM  Dressing Type Transparent;Occlusive 07/21/2016  8:00 AM  Dressing Status Clean;Dry;Intact;Antimicrobial disc in place 07/21/2016  8:00 AM  Line Care Connections checked and tightened 07/21/2016  8:00 AM  Line Adjustment (NICU/IV Team Only) No 07/19/2016  7:42 AM  Dressing Intervention Dressing reinforced 07/16/2016  8:00 PM  Dressing Change Due 07/23/16 07/21/2016  8:00 AM     NG/OG Tube Orogastric 18 Fr. Center mouth Xray (Active)  External Length of Tube (cm) - (if applicable) 56 cm 07/19/2016  8:00 PM  Site Assessment Clean;Dry;Intact 07/21/2016  8:00 AM  Ongoing Placement Verification Auscultation;No change in respiratory status 07/21/2016  8:00 AM  Status Infusing tube feed 07/21/2016  8:00 AM  Drainage Appearance Bile 07/12/2016  8:00 AM  Intake (mL) 120 mL 07/21/2016  9:00 AM  Output (mL) 400 mL 07/12/2016 11:00 AM     Urethral Catheter Caryl Bis, RN Latex;Temperature probe 16 Fr. (Active)  Indication for Insertion or Continuance of Catheter Aggressive IV diuresis 07/21/2016  8:00 AM   Site Assessment Clean;Intact 07/20/2016  8:00 PM  Catheter Maintenance Bag below level of bladder;Catheter secured;Drainage bag/tubing not touching floor;Insertion date on drainage bag;No dependent loops;Seal intact 07/21/2016  8:00 AM  Collection Container Standard drainage bag 07/20/2016  8:00 PM  Securement Method Securing device (Describe) 07/20/2016  8:00 PM  Urinary Catheter Interventions Unclamped 07/20/2016  8:00 PM  Output (mL) 150 mL 07/21/2016 10:00 AM    Microbiology/Sepsis markers: Results for orders placed or performed during the hospital encounter of 07/08/16  Culture, Urine     Status: None   Collection Time: 07/11/16 10:20 AM  Result Value Ref Range Status   Specimen Description URINE, CATHETERIZED  Final   Special Requests NONE  Final   Culture NO GROWTH  Final   Report Status 07/12/2016 FINAL  Final  Culture, blood (routine x 2)     Status: None   Collection Time: 07/11/16 11:06 AM  Result Value Ref Range Status   Specimen Description BLOOD BLOOD LEFT FOREARM  Final   Special Requests IN PEDIATRIC BOTTLE 3CC  Final   Culture NO GROWTH 5 DAYS  Final   Report Status 07/16/2016 FINAL  Final  Culture, blood (routine x 2)     Status: None   Collection Time: 07/11/16 11:06 AM  Result Value Ref Range Status   Specimen Description BLOOD BLOOD LEFT FOREARM  Final   Special Requests IN PEDIATRIC BOTTLE 2.5CC  Final   Culture NO GROWTH 5 DAYS  Final  Report Status 07/16/2016 FINAL  Final  Culture, respiratory (NON-Expectorated)     Status: None   Collection Time: 07/11/16 11:25 AM  Result Value Ref Range Status   Specimen Description TRACHEAL ASPIRATE  Final   Special Requests NONE  Final   Gram Stain   Final    MODERATE WBC PRESENT, PREDOMINANTLY MONONUCLEAR RARE GRAM POSITIVE COCCI IN PAIRS    Culture   Final    ABUNDANT HAEMOPHILUS INFLUENZAE BETA LACTAMASE NEGATIVE    Report Status 07/13/2016 FINAL  Final  MRSA PCR Screening     Status: None   Collection Time:  07/16/16  1:58 AM  Result Value Ref Range Status   MRSA by PCR NEGATIVE NEGATIVE Final    Comment:        The GeneXpert MRSA Assay (FDA approved for NASAL specimens only), is one component of a comprehensive MRSA colonization surveillance program. It is not intended to diagnose MRSA infection nor to guide or monitor treatment for MRSA infections.     Anti-infectives:  Anti-infectives    Start     Dose/Rate Route Frequency Ordered Stop   07/14/16 0900  cefTRIAXone (ROCEPHIN) 2 g in dextrose 5 % 50 mL IVPB     2 g 100 mL/hr over 30 Minutes Intravenous Every 24 hours 07/14/16 0814 07/21/16 0927   07/11/16 1830  vancomycin (VANCOCIN) IVPB 1000 mg/200 mL premix  Status:  Discontinued     1,000 mg 200 mL/hr over 60 Minutes Intravenous Every 8 hours 07/11/16 0952 07/13/16 0820   07/11/16 1030  vancomycin (VANCOCIN) 2,000 mg in sodium chloride 0.9 % 500 mL IVPB     2,000 mg 250 mL/hr over 120 Minutes Intravenous  Once 07/11/16 0952 07/11/16 1400   07/11/16 0945  piperacillin-tazobactam (ZOSYN) IVPB 3.375 g  Status:  Discontinued     3.375 g 12.5 mL/hr over 240 Minutes Intravenous Every 8 hours 07/11/16 0938 07/14/16 0814   07/08/16 2200  ceFAZolin (ANCEF) IVPB 1 g/50 mL premix  Status:  Discontinued     1 g 100 mL/hr over 30 Minutes Intravenous Every 8 hours 07/08/16 1415 07/11/16 0938   07/08/16 1300  ceFAZolin (ANCEF) IVPB 2g/100 mL premix     2 g 200 mL/hr over 30 Minutes Intravenous STAT 07/08/16 1252 07/08/16 1354      Best Practice/Protocols:  VTE Prophylaxis: Mechanical GI Prophylaxis: Proton Pump Inhibitor Continous Sedation  Consults: Treatment Team:  Loura Halt Ditty, MD    Events:  Subjective:    Overnight Issues: Still on Precedex and Versed and fentanyl.  Would not respond to me this AM  Objective:  Vital signs for last 24 hours: Temp:  [97.9 F (36.6 C)-100.8 F (38.2 C)] 98.1 F (36.7 C) (01/19 0800) Pulse Rate:  [68-102] 76 (01/19  1000) Resp:  [16-31] 16 (01/19 1000) BP: (111-146)/(65-91) 144/91 (01/19 1000) SpO2:  [92 %-100 %] 99 % (01/19 1000) FiO2 (%):  [40 %] 40 % (01/19 0800) Weight:  [124 kg (273 lb 5.9 oz)] 124 kg (273 lb 5.9 oz) (01/19 0431)  Hemodynamic parameters for last 24 hours:    Intake/Output from previous day: 01/18 0701 - 01/19 0700 In: 5971.5 [I.V.:4596.5; NG/GT:1375] Out: 4815 [Urine:4815]  Intake/Output this shift: Total I/O In: 928.5 [I.V.:543.5; NG/GT:230; IV Piggyback:155] Out: 425 [Urine:425]  Vent settings for last 24 hours: Vent Mode: PRVC FiO2 (%):  [40 %] 40 % Set Rate:  [16 bmp] 16 bmp Vt Set:  [650 mL] 650 mL PEEP:  [5 cmH20] 5 cmH20  Plateau Pressure:  [23 cmH20-27 cmH20] 27 cmH20  Physical Exam:  General: no respiratory distress and heavily sedated Neuro: nonfocal exam, RASS -1 and RASS -2 Resp: rhonchi RLL, RML and RUL CVS: regular rate and rhythm, S1, S2 normal, no murmur, click, rub or gallop and intermittent sinus tachycardia GI: distended, hypoactive BS and having some bowel movements.  Though patient was over-distended and tried to decompress through OGT without much success Extremities: edema 2+  Results for orders placed or performed during the hospital encounter of 07/08/16 (from the past 24 hour(s))  Glucose, capillary     Status: Abnormal   Collection Time: 07/20/16 12:05 PM  Result Value Ref Range   Glucose-Capillary 110 (H) 65 - 99 mg/dL   Comment 1 Notify RN    Comment 2 Document in Chart   Glucose, capillary     Status: Abnormal   Collection Time: 07/20/16  4:00 PM  Result Value Ref Range   Glucose-Capillary 155 (H) 65 - 99 mg/dL   Comment 1 Notify RN   Glucose, capillary     Status: Abnormal   Collection Time: 07/20/16  8:02 PM  Result Value Ref Range   Glucose-Capillary 152 (H) 65 - 99 mg/dL  Glucose, capillary     Status: Abnormal   Collection Time: 07/20/16 11:54 PM  Result Value Ref Range   Glucose-Capillary 136 (H) 65 - 99 mg/dL   Glucose, capillary     Status: Abnormal   Collection Time: 07/21/16  4:00 AM  Result Value Ref Range   Glucose-Capillary 133 (H) 65 - 99 mg/dL  CBC     Status: Abnormal   Collection Time: 07/21/16  4:39 AM  Result Value Ref Range   WBC 7.9 4.0 - 10.5 K/uL   RBC 3.81 (L) 4.22 - 5.81 MIL/uL   Hemoglobin 12.3 (L) 13.0 - 17.0 g/dL   HCT 16.1 (L) 09.6 - 04.5 %   MCV 101.0 (H) 78.0 - 100.0 fL   MCH 32.3 26.0 - 34.0 pg   MCHC 31.9 30.0 - 36.0 g/dL   RDW 40.9 81.1 - 91.4 %   Platelets 221 150 - 400 K/uL  Basic metabolic panel     Status: Abnormal   Collection Time: 07/21/16  4:39 AM  Result Value Ref Range   Sodium 136 135 - 145 mmol/L   Potassium 3.9 3.5 - 5.1 mmol/L   Chloride 104 101 - 111 mmol/L   CO2 25 22 - 32 mmol/L   Glucose, Bld 122 (H) 65 - 99 mg/dL   BUN 16 6 - 20 mg/dL   Creatinine, Ser 7.82 (L) 0.61 - 1.24 mg/dL   Calcium 7.6 (L) 8.9 - 10.3 mg/dL   GFR calc non Af Amer >60 >60 mL/min   GFR calc Af Amer >60 >60 mL/min   Anion gap 7 5 - 15  Glucose, capillary     Status: Abnormal   Collection Time: 07/21/16  7:47 AM  Result Value Ref Range   Glucose-Capillary 167 (H) 65 - 99 mg/dL     Assessment/Plan:   NEURO  Altered Mental Status:  sedation   Plan: Wean sedation as we try to wean from the ventilator  PULM  Atelectasis/collapse (bibasilar)   Plan: Continue ventilation, try to use diuresis  CARDIO  Sinus Tachycardia   Plan: No specific treatment  RENAL  Urine output is good   Plan: Diuresis for hypervolemia  GI  No specific problems   Plan: Looks like we can contnue tube feedings.  ID  Pneumonia (hospital acquired (not ventilator-associated) Getting appropriate treatment.)   Plan: CPM  HEME  Labs from this morning seem unusually different.  Will repeat CBC   Plan: Probably will not need blood given  ENDO No specificd issues   Plan: CPM  Global Issues  Doing well, but not awakening enough to wean from the ventilator.  Had been very agitated before,  but now seems calm and may need to back down on some of his medications.    LOS: 13 days   Additional comments:I reviewed the patient's new clinical lab test results. cbc/bmet and I reviewed the patients new imaging test results. cxr  Critical Care Total Time*: 30 Minutes  Mikele Sifuentes 07/21/2016  *Care during the described time interval was provided by me and/or other providers on the critical care team.  I have reviewed this patient's available data, including medical history, events of note, physical examination and test results as part of my evaluation.
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
## 2016-07-21 NOTE — Progress Notes (Signed)
TF held starting now for 1 hour with OGT to continuous low suction per Dr. Lindie SpruceWyatt.

## 2016-07-22 ENCOUNTER — Inpatient Hospital Stay (HOSPITAL_COMMUNITY): Payer: Medicaid Other

## 2016-07-22 DIAGNOSIS — L8990 Pressure ulcer of unspecified site, unspecified stage: Secondary | ICD-10-CM | POA: Insufficient documentation

## 2016-07-22 LAB — CBC WITH DIFFERENTIAL/PLATELET
BASOS PCT: 0 %
Basophils Absolute: 0 10*3/uL (ref 0.0–0.1)
EOS PCT: 3 %
Eosinophils Absolute: 0.3 10*3/uL (ref 0.0–0.7)
HCT: 25.4 % — ABNORMAL LOW (ref 39.0–52.0)
Hemoglobin: 8.3 g/dL — ABNORMAL LOW (ref 13.0–17.0)
LYMPHS ABS: 1.7 10*3/uL (ref 0.7–4.0)
Lymphocytes Relative: 15 %
MCH: 32.5 pg (ref 26.0–34.0)
MCHC: 32.7 g/dL (ref 30.0–36.0)
MCV: 99.6 fL (ref 78.0–100.0)
MONO ABS: 0.7 10*3/uL (ref 0.1–1.0)
Monocytes Relative: 6 %
NEUTROS ABS: 8.6 10*3/uL — AB (ref 1.7–7.7)
NEUTROS PCT: 76 %
PLATELETS: 375 10*3/uL (ref 150–400)
RBC: 2.55 MIL/uL — ABNORMAL LOW (ref 4.22–5.81)
RDW: 13.2 % (ref 11.5–15.5)
WBC: 11.3 10*3/uL — ABNORMAL HIGH (ref 4.0–10.5)

## 2016-07-22 LAB — BASIC METABOLIC PANEL
Anion gap: 6 (ref 5–15)
BUN: 17 mg/dL (ref 6–20)
CHLORIDE: 101 mmol/L (ref 101–111)
CO2: 29 mmol/L (ref 22–32)
Calcium: 8.7 mg/dL — ABNORMAL LOW (ref 8.9–10.3)
Creatinine, Ser: 0.66 mg/dL (ref 0.61–1.24)
GFR calc Af Amer: >60 mL/min (ref >60)
GLUCOSE: 133 mg/dL — AB (ref 65–99)
POTASSIUM: 4 mmol/L (ref 3.5–5.1)
Sodium: 136 mmol/L (ref 135–145)

## 2016-07-22 LAB — GLUCOSE, CAPILLARY
GLUCOSE-CAPILLARY: 171 mg/dL — AB (ref 65–99)
GLUCOSE-CAPILLARY: 107 mg/dL — AB (ref 65–99)
Glucose-Capillary: 128 mg/dL — ABNORMAL HIGH (ref 65–99)
Glucose-Capillary: 121 mg/dL — ABNORMAL HIGH (ref 65–99)
Glucose-Capillary: 186 mg/dL — ABNORMAL HIGH (ref 65–99)
Glucose-Capillary: 148 mg/dL — ABNORMAL HIGH (ref 65–99)
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
## 2016-07-23 ENCOUNTER — Inpatient Hospital Stay (HOSPITAL_COMMUNITY): Payer: Medicaid Other

## 2016-07-23 LAB — CBC
HEMATOCRIT: 25.4 % — AB (ref 39.0–52.0)
HEMOGLOBIN: 8.3 g/dL — AB (ref 13.0–17.0)
MCH: 31.9 pg (ref 26.0–34.0)
MCHC: 32.7 g/dL (ref 30.0–36.0)
MCV: 97.7 fL (ref 78.0–100.0)
Platelets: 404 10*3/uL — ABNORMAL HIGH (ref 150–400)
RBC: 2.6 MIL/uL — ABNORMAL LOW (ref 4.22–5.81)
RDW: 12.8 % (ref 11.5–15.5)
WBC: 15.1 10*3/uL — AB (ref 4.0–10.5)

## 2016-07-23 LAB — GLUCOSE, CAPILLARY
GLUCOSE-CAPILLARY: 119 mg/dL — AB (ref 65–99)
Glucose-Capillary: 110 mg/dL — ABNORMAL HIGH (ref 65–99)
Glucose-Capillary: 160 mg/dL — ABNORMAL HIGH (ref 65–99)
Glucose-Capillary: 135 mg/dL — ABNORMAL HIGH (ref 65–99)
Glucose-Capillary: 112 mg/dL — ABNORMAL HIGH (ref 65–99)

## 2016-07-23 MED ORDER — FENTANYL 50 MCG/HR TD PT72
50.0000 ug | MEDICATED_PATCH | TRANSDERMAL | Status: DC
  Administered 2016-07-23 – 2016-07-26 (×2): 50 ug via TRANSDERMAL
  Filled 2016-07-23 (×2): qty 1

## 2016-07-23 MED ORDER — CLONAZEPAM 1 MG PO TABS
2.0000 mg | ORAL_TABLET | Freq: Three times a day (TID) | ORAL | Status: DC
  Administered 2016-07-23 – 2016-07-28 (×18): 2 mg
  Filled 2016-07-23 (×18): qty 2

## 2016-07-23 MED ORDER — FUROSEMIDE 10 MG/ML IJ SOLN
40.0000 mg | Freq: Once | INTRAMUSCULAR | Status: AC
  Administered 2016-07-23: 40 mg via INTRAVENOUS
  Filled 2016-07-23: qty 4

## 2016-07-23 NOTE — Progress Notes (Signed)
MD called due to episode of patient vomiting and need to reposition ET tube after vomiting. Orders received for xray and low intermittent suction of OG tube.
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
## 2016-07-24 ENCOUNTER — Inpatient Hospital Stay (HOSPITAL_COMMUNITY): Payer: Medicaid Other

## 2016-07-24 LAB — CBC WITH DIFFERENTIAL/PLATELET
BASOS PCT: 0 %
Basophils Absolute: 0.1 10*3/uL (ref 0.0–0.1)
EOS ABS: 0.1 10*3/uL (ref 0.0–0.7)
Eosinophils Relative: 1 %
HCT: 27.2 % — ABNORMAL LOW (ref 39.0–52.0)
Hemoglobin: 9 g/dL — ABNORMAL LOW (ref 13.0–17.0)
Lymphocytes Relative: 10 %
Lymphs Abs: 1.6 10*3/uL (ref 0.7–4.0)
MCH: 32.3 pg (ref 26.0–34.0)
MCHC: 33.1 g/dL (ref 30.0–36.0)
MCV: 97.5 fL (ref 78.0–100.0)
MONO ABS: 1.1 10*3/uL — AB (ref 0.1–1.0)
MONOS PCT: 7 %
Neutro Abs: 14.3 10*3/uL — ABNORMAL HIGH (ref 1.7–7.7)
Neutrophils Relative %: 82 %
Platelets: 437 10*3/uL — ABNORMAL HIGH (ref 150–400)
RBC: 2.79 MIL/uL — ABNORMAL LOW (ref 4.22–5.81)
RDW: 12.9 % (ref 11.5–15.5)
WBC: 17.3 10*3/uL — ABNORMAL HIGH (ref 4.0–10.5)

## 2016-07-24 LAB — BASIC METABOLIC PANEL
Anion gap: 8 (ref 5–15)
BUN: 11 mg/dL (ref 6–20)
CALCIUM: 8.6 mg/dL — AB (ref 8.9–10.3)
CO2: 28 mmol/L (ref 22–32)
Chloride: 98 mmol/L — ABNORMAL LOW (ref 101–111)
Creatinine, Ser: 0.8 mg/dL (ref 0.61–1.24)
GFR calc non Af Amer: >60 mL/min (ref >60)
Glucose, Bld: 150 mg/dL — ABNORMAL HIGH (ref 65–99)
Potassium: 3.8 mmol/L (ref 3.5–5.1)
SODIUM: 134 mmol/L — AB (ref 135–145)

## 2016-07-24 LAB — GLUCOSE, CAPILLARY
GLUCOSE-CAPILLARY: 126 mg/dL — AB (ref 65–99)
GLUCOSE-CAPILLARY: 173 mg/dL — AB (ref 65–99)
GLUCOSE-CAPILLARY: 163 mg/dL — AB (ref 65–99)
Glucose-Capillary: 144 mg/dL — ABNORMAL HIGH (ref 65–99)
Glucose-Capillary: 153 mg/dL — ABNORMAL HIGH (ref 65–99)
Glucose-Capillary: 168 mg/dL — ABNORMAL HIGH (ref 65–99)
Glucose-Capillary: 191 mg/dL — ABNORMAL HIGH (ref 65–99)
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
## 2016-07-25 LAB — BLOOD CULTURE ID PANEL (REFLEXED)
ACINETOBACTER BAUMANNII: NOT DETECTED
CANDIDA GLABRATA: NOT DETECTED
CANDIDA KRUSEI: NOT DETECTED
Candida albicans: NOT DETECTED
Candida parapsilosis: NOT DETECTED
Candida tropicalis: NOT DETECTED
ENTEROBACTER CLOACAE COMPLEX: NOT DETECTED
ENTEROCOCCUS SPECIES: NOT DETECTED
ESCHERICHIA COLI: NOT DETECTED
Enterobacteriaceae species: NOT DETECTED
Haemophilus influenzae: NOT DETECTED
Klebsiella oxytoca: NOT DETECTED
Klebsiella pneumoniae: NOT DETECTED
LISTERIA MONOCYTOGENES: NOT DETECTED
Methicillin resistance: DETECTED — AB
NEISSERIA MENINGITIDIS: NOT DETECTED
PROTEUS SPECIES: NOT DETECTED
PSEUDOMONAS AERUGINOSA: NOT DETECTED
STAPHYLOCOCCUS AUREUS BCID: NOT DETECTED
STAPHYLOCOCCUS SPECIES: DETECTED — AB
STREPTOCOCCUS AGALACTIAE: NOT DETECTED
STREPTOCOCCUS PNEUMONIAE: NOT DETECTED
Serratia marcescens: NOT DETECTED
Streptococcus pyogenes: NOT DETECTED
Streptococcus species: NOT DETECTED

## 2016-07-25 LAB — BASIC METABOLIC PANEL
Anion gap: 8 (ref 5–15)
BUN: 13 mg/dL (ref 6–20)
CALCIUM: 8.4 mg/dL — AB (ref 8.9–10.3)
CO2: 27 mmol/L (ref 22–32)
Chloride: 100 mmol/L — ABNORMAL LOW (ref 101–111)
Creatinine, Ser: 0.84 mg/dL (ref 0.61–1.24)
GFR calc Af Amer: >60 mL/min (ref >60)
GLUCOSE: 167 mg/dL — AB (ref 65–99)
POTASSIUM: 3.9 mmol/L (ref 3.5–5.1)
SODIUM: 135 mmol/L (ref 135–145)

## 2016-07-25 LAB — GLUCOSE, CAPILLARY
GLUCOSE-CAPILLARY: 137 mg/dL — AB (ref 65–99)
GLUCOSE-CAPILLARY: 177 mg/dL — AB (ref 65–99)
GLUCOSE-CAPILLARY: 200 mg/dL — AB (ref 65–99)
GLUCOSE-CAPILLARY: 217 mg/dL — AB (ref 65–99)
Glucose-Capillary: 180 mg/dL — ABNORMAL HIGH (ref 65–99)
Glucose-Capillary: 139 mg/dL — ABNORMAL HIGH (ref 65–99)

## 2016-07-25 LAB — CBC
HCT: 25.5 % — ABNORMAL LOW (ref 39.0–52.0)
Hemoglobin: 8.3 g/dL — ABNORMAL LOW (ref 13.0–17.0)
MCH: 32 pg (ref 26.0–34.0)
MCHC: 32.5 g/dL (ref 30.0–36.0)
MCV: 98.5 fL (ref 78.0–100.0)
PLATELETS: 418 10*3/uL — AB (ref 150–400)
RBC: 2.59 MIL/uL — AB (ref 4.22–5.81)
RDW: 13.4 % (ref 11.5–15.5)
WBC: 23.5 10*3/uL — ABNORMAL HIGH (ref 4.0–10.5)

## 2016-07-25 LAB — URINE CULTURE: SPECIAL REQUESTS: NORMAL

## 2016-07-25 LAB — CULTURE, RESPIRATORY

## 2016-07-25 LAB — CULTURE, RESPIRATORY W GRAM STAIN: Special Requests: NORMAL

## 2016-07-25 MED ORDER — VANCOMYCIN HCL IN DEXTROSE 1-5 GM/200ML-% IV SOLN
1000.0000 mg | Freq: Three times a day (TID) | INTRAVENOUS | Status: DC
  Administered 2016-07-25 – 2016-07-26 (×3): 1000 mg via INTRAVENOUS
  Filled 2016-07-25 (×4): qty 200

## 2016-07-25 MED ORDER — IBUPROFEN 100 MG/5ML PO SUSP
400.0000 mg | Freq: Four times a day (QID) | ORAL | Status: DC | PRN
  Administered 2016-07-25 – 2016-08-05 (×13): 400 mg via ORAL
  Filled 2016-07-25 (×14): qty 20

## 2016-07-25 MED ORDER — PIPERACILLIN-TAZOBACTAM 3.375 G IVPB
3.3750 g | Freq: Three times a day (TID) | INTRAVENOUS | Status: DC
  Administered 2016-07-25 – 2016-07-26 (×4): 3.375 g via INTRAVENOUS
  Filled 2016-07-25 (×5): qty 50

## 2016-07-25 MED ORDER — LORAZEPAM 2 MG/ML IJ SOLN
INTRAMUSCULAR | Status: AC
Start: 2016-07-25 — End: 2016-07-25
  Administered 2016-07-25: 2 mg via INTRAVENOUS
  Filled 2016-07-25: qty 1

## 2016-07-25 MED ORDER — LORAZEPAM 2 MG/ML IJ SOLN
1.0000 mg | INTRAMUSCULAR | Status: DC | PRN
  Administered 2016-07-25 – 2016-07-29 (×2): 2 mg via INTRAVENOUS
  Filled 2016-07-25: qty 1

## 2016-07-25 MED ORDER — PANTOPRAZOLE SODIUM 40 MG PO PACK
40.0000 mg | PACK | Freq: Every day | ORAL | Status: DC
  Administered 2016-07-25 – 2016-08-18 (×25): 40 mg
  Filled 2016-07-25 (×26): qty 20

## 2016-07-25 MED ORDER — LEVETIRACETAM 100 MG/ML PO SOLN
500.0000 mg | Freq: Two times a day (BID) | ORAL | Status: DC
  Administered 2016-07-25 – 2016-08-18 (×49): 500 mg
  Filled 2016-07-25 (×53): qty 5

## 2016-07-25 MED ORDER — VANCOMYCIN HCL 10 G IV SOLR
2000.0000 mg | Freq: Once | INTRAVENOUS | Status: AC
  Administered 2016-07-25: 2000 mg via INTRAVENOUS
  Filled 2016-07-25: qty 2000

## 2016-07-25 NOTE — Progress Notes (Signed)
  PHARMACY - PHYSICIAN COMMUNICATION CRITICAL VALUE ALERT - BLOOD CULTURE IDENTIFICATION (BCID)  Results for orders placed or performed during the hospital encounter of 07/08/16  Blood Culture ID Panel (Reflexed) (Collected: 07/24/2016  2:00 PM)  Result Value Ref Range   Enterococcus species NOT DETECTED NOT DETECTED   Listeria monocytogenes NOT DETECTED NOT DETECTED   Staphylococcus species DETECTED (A) NOT DETECTED   Staphylococcus aureus NOT DETECTED NOT DETECTED   Methicillin resistance DETECTED (A) NOT DETECTED   Streptococcus species NOT DETECTED NOT DETECTED   Streptococcus agalactiae NOT DETECTED NOT DETECTED   Streptococcus pneumoniae NOT DETECTED NOT DETECTED   Streptococcus pyogenes NOT DETECTED NOT DETECTED   Acinetobacter baumannii NOT DETECTED NOT DETECTED   Enterobacteriaceae species NOT DETECTED NOT DETECTED   Enterobacter cloacae complex NOT DETECTED NOT DETECTED   Escherichia coli NOT DETECTED NOT DETECTED   Klebsiella oxytoca NOT DETECTED NOT DETECTED   Klebsiella pneumoniae NOT DETECTED NOT DETECTED   Proteus species NOT DETECTED NOT DETECTED   Serratia marcescens NOT DETECTED NOT DETECTED   Haemophilus influenzae NOT DETECTED NOT DETECTED   Neisseria meningitidis NOT DETECTED NOT DETECTED   Pseudomonas aeruginosa NOT DETECTED NOT DETECTED   Candida albicans NOT DETECTED NOT DETECTED   Candida glabrata NOT DETECTED NOT DETECTED   Candida krusei NOT DETECTED NOT DETECTED   Candida parapsilosis NOT DETECTED NOT DETECTED   Candida tropicalis NOT DETECTED NOT DETECTED    Name of physician (or Provider) Contacted: None  Changes to prescribed antibiotics required: No changes needed.  Patient already on vancomycin for SA in TA.     Leota SauersLisa Selyna Klahn Pharm.D. CPP, BCPS Clinical Pharmacist [phone] 07/25/2016 4:22 PM

## 2016-07-25 NOTE — Progress Notes (Signed)
Pharmacy Antibiotic Note  [name] is a 36 y.o. male admitted on 07/08/2016.  Pharmacy has been consulted for vancomycin and zosyn dosing for PNA.  WBC 23.5, creat 0.84, t max 101.3, CXR PNA.  1/21 TA: mod staph aureus.   Plan: vancomycin 2 gm loading dose Vancomycin 1000 mg IV every 8 hours.  Goal trough 15-20 mcg/mL. Zosyn 3.375g IV q8h (4 hour infusion).  F/u sensitivities of staph aureus F/u renal fxn, wbc, temp Vancomycin levels as needed  Height: 6\' 2"  (188 cm) Weight: 241 lb 2.9 oz (109.4 kg) IBW/kg (Calculated) : 82.2  Temp (24hrs), Avg:100.9 F (38.3 C), Min:99.7 F (37.6 C), Max:102.6 F (39.2 C)   Recent Labs Lab 07/20/16 0438 07/21/16 0439 07/22/16 0555 07/23/16 0400 07/24/16 0540 07/25/16 0430  WBC 15.4* 7.9 11.3* 15.1* 17.3* 23.5*  CREATININE 0.66 0.60* 0.66  --  0.80 0.84    Estimated Creatinine Clearance: 164.7 mL/min (by C-G formula based on SCr of 0.84 mg/dL).    No Known Allergies  Antimicrobials this admission:  Vanc 1/9 >>1/11  1/23 Zosyn 1/9 >> 1/12  1/23 CTX 1/12>> 1/19  Dose adjustments this admission:  Microbiology results:  1/9 BCx: ngF 1/9 UCx: negF 1/9 Sputum: abundant H.flu (beta-lactamase neg) 1/14 MRSA PCR: neg 1/22 BCx2>> 1/21 TA: mod SA 1/22 Ucx>  Herby AbrahamMichelle T. Aspasia Rude, Pharm.D. [phone] 07/25/2016 8:41 AM
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
## 2016-07-26 ENCOUNTER — Inpatient Hospital Stay (HOSPITAL_COMMUNITY): Payer: Medicaid Other

## 2016-07-26 DIAGNOSIS — S069X9A Unspecified intracranial injury with loss of consciousness of unspecified duration, initial encounter: Secondary | ICD-10-CM

## 2016-07-26 DIAGNOSIS — Z9911 Dependence on respirator [ventilator] status: Secondary | ICD-10-CM

## 2016-07-26 DIAGNOSIS — R509 Fever, unspecified: Secondary | ICD-10-CM

## 2016-07-26 DIAGNOSIS — D72829 Elevated white blood cell count, unspecified: Secondary | ICD-10-CM

## 2016-07-26 DIAGNOSIS — R7881 Bacteremia: Secondary | ICD-10-CM

## 2016-07-26 DIAGNOSIS — Z95828 Presence of other vascular implants and grafts: Secondary | ICD-10-CM

## 2016-07-26 LAB — BASIC METABOLIC PANEL
ANION GAP: 9 (ref 5–15)
BUN: 18 mg/dL (ref 6–20)
CO2: 28 mmol/L (ref 22–32)
Calcium: 8.4 mg/dL — ABNORMAL LOW (ref 8.9–10.3)
Chloride: 100 mmol/L — ABNORMAL LOW (ref 101–111)
Creatinine, Ser: 0.8 mg/dL (ref 0.61–1.24)
GFR calc Af Amer: >60 mL/min (ref >60)
GFR calc non Af Amer: >60 mL/min (ref >60)
GLUCOSE: 136 mg/dL — AB (ref 65–99)
Potassium: 3.5 mmol/L (ref 3.5–5.1)
Sodium: 137 mmol/L (ref 135–145)

## 2016-07-26 LAB — CBC
HEMATOCRIT: 23 % — AB (ref 39.0–52.0)
Hemoglobin: 7.2 g/dL — ABNORMAL LOW (ref 13.0–17.0)
MCH: 31.2 pg (ref 26.0–34.0)
MCHC: 31.3 g/dL (ref 30.0–36.0)
MCV: 99.6 fL (ref 78.0–100.0)
Platelets: 373 10*3/uL (ref 150–400)
RBC: 2.31 MIL/uL — ABNORMAL LOW (ref 4.22–5.81)
RDW: 13.2 % (ref 11.5–15.5)
WBC: 21.1 10*3/uL — ABNORMAL HIGH (ref 4.0–10.5)

## 2016-07-26 LAB — GLUCOSE, CAPILLARY
GLUCOSE-CAPILLARY: 208 mg/dL — AB (ref 65–99)
GLUCOSE-CAPILLARY: 183 mg/dL — AB (ref 65–99)
GLUCOSE-CAPILLARY: 122 mg/dL — AB (ref 65–99)
Glucose-Capillary: 159 mg/dL — ABNORMAL HIGH (ref 65–99)
Glucose-Capillary: 148 mg/dL — ABNORMAL HIGH (ref 65–99)

## 2016-07-26 MED ORDER — DEXTROSE 5 % IV SOLN
2.0000 g | INTRAVENOUS | Status: AC
  Administered 2016-07-26 – 2016-08-01 (×7): 2 g via INTRAVENOUS
  Filled 2016-07-26 (×7): qty 2
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
## 2016-07-26 NOTE — Progress Notes (Signed)
Orthopedic Tech Progress Note Patient Details:  Justin ElseDarren T Page October 24, 1980 [id number]  Ortho Devices Type of Ortho Device:  (prafo boot) Ortho Device/Splint Location: bilateral Ortho Device/Splint Interventions: Application   [name] 07/26/2016, 12:40 PM

## 2016-07-26 NOTE — Consult Note (Addendum)
WOC Nurse wound consult note Reason for Consult: Consult requested for buttocks/sacrum Wound type: Pt has full thickness fissure to inner gluteal fold which extends to the sacral area; 3X.1X.1cm.  Appearance and location is consistent with prolonged moisture, NOT a pressure injury. Inner buttock with partial thickness skin tear, .5X.5X.1cm, red macerated skin surrounding a red moist wound bed to both locations. Drainage (amount, consistency, odor) Small amt yellow drainage, no odor. Dressing procedure/placement/frequency: Pt is on a Sport low air loss bed to increase airflow and decrease moisture.  Aquacel to absorb drainage and provide antimicrobial benefits, and protect site with foam dressing.  It is difficult to keep wound from becoming soiled related to close proximity to rectum and pt is incontinent of stools periodically. No family members at the bedside to discuss plan of care. Please re-consult if further assistance is needed.  Thank-you,  Cammie Mcgeeawn Annissa Andreoni MSN, RN, CWOCN, [place], CNS [phone]

## 2016-07-26 NOTE — Progress Notes (Signed)
Rehab Admissions Coordinator Note:  Patient was screened by Trish MageLogue, Channah Godeaux M for appropriateness for an Inpatient Acute Rehab Consult.  At this time, patient is on the vent and is not fully participatory.  Once off the vent and participating more, agree with inpatient rehab consult need.  Call me for questions.  Trish MageLogue, Taylyn Brame M 07/26/2016, 3:11 PM  I can be reached at [phone].

## 2016-07-26 NOTE — Progress Notes (Signed)
Pt has open bleeding wound proximal to anus. Area shown to Dr Lindie SpruceWyatt. WOC ordered, area cleaned, foam dressing applied.
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
## 2016-07-26 NOTE — Progress Notes (Signed)
Follow up - Trauma and Critical Care  Patient Details:    [name] is an 36 y.o. male.  Lines/tubes : Airway 7.5 mm (Active)  Secured at (cm) 24 cm 07/26/2016  8:08 AM  Measured From Lips 07/26/2016  8:08 AM  Secured Location Left 07/26/2016  8:08 AM  Secured By [employer] 07/26/2016  8:08 AM  Tube Holder Repositioned Yes 07/26/2016  8:08 AM  Cuff Pressure (cm H2O) 28 cm H2O 07/25/2016  7:33 PM  Site Condition Dry 07/26/2016  8:08 AM     PICC Triple Lumen 07/09/16 PICC Right Brachial 44 cm 0 cm (Active)  Indication for Insertion or Continuance of Line Prolonged intravenous therapies 07/25/2016  8:00 AM  Exposed Catheter (cm) 0 cm 07/23/2016  8:00 PM  Site Assessment Clean;Dry;Intact 07/25/2016  8:00 PM  Lumen #1 Status Infusing;Flushed;Blood return noted 07/25/2016  8:00 PM  Lumen #2 Status Infusing;Flushed;Blood return noted 07/25/2016  8:00 PM  Lumen #3 Status Infusing 07/25/2016  8:00 PM  Dressing Type Transparent;Occlusive 07/25/2016  8:00 PM  Dressing Status Clean;Dry;Antimicrobial disc in place;Intact 07/25/2016  8:00 PM  Line Care Connections checked and tightened 07/25/2016  8:00 PM  Line Adjustment (NICU/IV Team Only) No 07/24/2016  8:00 AM  Dressing Intervention Dressing reinforced 07/23/2016  8:00 PM  Dressing Change Due 07/30/16 07/24/2016  7:45 PM     NG/OG Tube Orogastric 18 Fr. Center mouth Xray (Active)  External Length of Tube (cm) - (if applicable) 56 cm 07/23/2016  8:00 PM  Site Assessment Clean;Dry;Intact 07/25/2016  8:00 PM  Ongoing Placement Verification No change in respiratory status 07/25/2016  8:00 PM  Status Infusing tube feed 07/25/2016  8:00 PM  Drainage Appearance Bile 07/23/2016  8:00 PM  Intake (mL) 100 mL 07/24/2016  1:00 PM  Output (mL) 400 mL 07/12/2016 11:00 AM     External Urinary Catheter (Active)  Collection Container Standard drainage bag 07/25/2016  7:45 PM  Output (mL) 800 mL 07/25/2016  2:00 PM    Microbiology/Sepsis markers: Results for  orders placed or performed during the hospital encounter of 07/08/16  Culture, Urine     Status: None   Collection Time: 07/11/16 10:20 AM  Result Value Ref Range Status   Specimen Description URINE, CATHETERIZED  Final   Special Requests NONE  Final   Culture NO GROWTH  Final   Report Status 07/12/2016 FINAL  Final  Culture, blood (routine x 2)     Status: None   Collection Time: 07/11/16 11:06 AM  Result Value Ref Range Status   Specimen Description BLOOD BLOOD LEFT FOREARM  Final   Special Requests IN PEDIATRIC BOTTLE 3CC  Final   Culture NO GROWTH 5 DAYS  Final   Report Status 07/16/2016 FINAL  Final  Culture, blood (routine x 2)     Status: None   Collection Time: 07/11/16 11:06 AM  Result Value Ref Range Status   Specimen Description BLOOD BLOOD LEFT FOREARM  Final   Special Requests IN PEDIATRIC BOTTLE 2.5CC  Final   Culture NO GROWTH 5 DAYS  Final   Report Status 07/16/2016 FINAL  Final  Culture, respiratory (NON-Expectorated)     Status: None   Collection Time: 07/11/16 11:25 AM  Result Value Ref Range Status   Specimen Description TRACHEAL ASPIRATE  Final   Special Requests NONE  Final   Gram Stain   Final    MODERATE WBC PRESENT, PREDOMINANTLY MONONUCLEAR RARE GRAM POSITIVE COCCI IN PAIRS    Culture  Final    ABUNDANT HAEMOPHILUS INFLUENZAE BETA LACTAMASE NEGATIVE    Report Status 07/13/2016 FINAL  Final  MRSA PCR Screening     Status: None   Collection Time: 07/16/16  1:58 AM  Result Value Ref Range Status   MRSA by PCR NEGATIVE NEGATIVE Final    Comment:        The GeneXpert MRSA Assay (FDA approved for NASAL specimens only), is one component of a comprehensive MRSA colonization surveillance program. It is not intended to diagnose MRSA infection nor to guide or monitor treatment for MRSA infections.   Culture, respiratory (NON-Expectorated)     Status: None   Collection Time: 07/23/16  9:11 AM  Result Value Ref Range Status   Specimen Description  TRACHEAL ASPIRATE  Final   Special Requests Normal  Final   Gram Stain   Final    ABUNDANT WBC PRESENT, PREDOMINANTLY PMN ABUNDANT GRAM POSITIVE COCCI IN PAIRS MODERATE GRAM POSITIVE COCCI IN CLUSTERS MODERATE GRAM POSITIVE RODS    Culture MODERATE STAPHYLOCOCCUS AUREUS  Final   Report Status 07/25/2016 FINAL  Final   Organism ID, Bacteria STAPHYLOCOCCUS AUREUS  Final      Susceptibility   Staphylococcus aureus - MIC*    CIPROFLOXACIN <=0.5 SENSITIVE Sensitive     ERYTHROMYCIN 0.5 SENSITIVE Sensitive     GENTAMICIN <=0.5 SENSITIVE Sensitive     OXACILLIN 0.5 SENSITIVE Sensitive     TETRACYCLINE <=1 SENSITIVE Sensitive     VANCOMYCIN <=0.5 SENSITIVE Sensitive     TRIMETH/SULFA <=10 SENSITIVE Sensitive     CLINDAMYCIN <=0.25 SENSITIVE Sensitive     RIFAMPIN <=0.5 SENSITIVE Sensitive     Inducible Clindamycin NEGATIVE Sensitive     * MODERATE STAPHYLOCOCCUS AUREUS  Culture, Urine     Status: Abnormal   Collection Time: 07/24/16 12:15 PM  Result Value Ref Range Status   Specimen Description URINE, RANDOM  Final   Special Requests Normal  Final   Culture <10,000 COLONIES/mL INSIGNIFICANT GROWTH (A)  Final   Report Status 07/25/2016 FINAL  Final  Culture, blood (routine x 2)     Status: None (Preliminary result)   Collection Time: 07/24/16  1:50 PM  Result Value Ref Range Status   Specimen Description BLOOD BLOOD LEFT ARM  Final   Special Requests BOTTLES DRAWN AEROBIC AND ANAEROBIC 10CC  Final   Culture NO GROWTH 1 DAY  Final   Report Status PENDING  Incomplete  Culture, blood (routine x 2)     Status: None (Preliminary result)   Collection Time: 07/24/16  2:00 PM  Result Value Ref Range Status   Specimen Description BLOOD LEFT ANTECUBITAL  Final   Special Requests BOTTLES DRAWN AEROBIC AND ANAEROBIC 10CC  Final   Culture  Setup Time   Final    GRAM POSITIVE COCCI IN CLUSTERS IN BOTH AEROBIC AND ANAEROBIC BOTTLES Organism ID to follow CRITICAL RESULT CALLED TO, READ BACK BY  AND VERIFIED WITH: Lelon Huh PHARMD 1530 07/25/16 M WILSON    Culture GRAM POSITIVE COCCI  Final   Report Status PENDING  Incomplete  Blood Culture ID Panel (Reflexed)     Status: Abnormal   Collection Time: 07/24/16  2:00 PM  Result Value Ref Range Status   Enterococcus species NOT DETECTED NOT DETECTED Final   Listeria monocytogenes NOT DETECTED NOT DETECTED Final   Staphylococcus species DETECTED (A) NOT DETECTED Final    Comment: CRITICAL RESULT CALLED TO, READ BACK BY AND VERIFIED WITH: LHosie Poisson.D. 15:30 07/25/16 ([email or web address])  Staphylococcus aureus NOT DETECTED NOT DETECTED Final   Methicillin resistance DETECTED (A) NOT DETECTED Final    Comment: CRITICAL RESULT CALLED TO, READ BACK BY AND VERIFIED WITH: LHosie Poisson.D. 15:30 07/25/16 (wilsonm)    Streptococcus species NOT DETECTED NOT DETECTED Final   Streptococcus agalactiae NOT DETECTED NOT DETECTED Final   Streptococcus pneumoniae NOT DETECTED NOT DETECTED Final   Streptococcus pyogenes NOT DETECTED NOT DETECTED Final   Acinetobacter baumannii NOT DETECTED NOT DETECTED Final   Enterobacteriaceae species NOT DETECTED NOT DETECTED Final   Enterobacter cloacae complex NOT DETECTED NOT DETECTED Final   Escherichia coli NOT DETECTED NOT DETECTED Final   Klebsiella oxytoca NOT DETECTED NOT DETECTED Final   Klebsiella pneumoniae NOT DETECTED NOT DETECTED Final   Proteus species NOT DETECTED NOT DETECTED Final   Serratia marcescens NOT DETECTED NOT DETECTED Final   Haemophilus influenzae NOT DETECTED NOT DETECTED Final   Neisseria meningitidis NOT DETECTED NOT DETECTED Final   Pseudomonas aeruginosa NOT DETECTED NOT DETECTED Final   Candida albicans NOT DETECTED NOT DETECTED Final   Candida glabrata NOT DETECTED NOT DETECTED Final   Candida krusei NOT DETECTED NOT DETECTED Final   Candida parapsilosis NOT DETECTED NOT DETECTED Final   Candida tropicalis NOT DETECTED NOT DETECTED Final    Anti-infectives:   Anti-infectives    Start     Dose/Rate Route Frequency Ordered Stop   07/25/16 1800  vancomycin (VANCOCIN) IVPB 1000 mg/200 mL premix     1,000 mg 200 mL/hr over 60 Minutes Intravenous Every 8 hours 07/25/16 0835     07/25/16 0900  piperacillin-tazobactam (ZOSYN) IVPB 3.375 g     3.375 g 12.5 mL/hr over 240 Minutes Intravenous Every 8 hours 07/25/16 0820     07/25/16 0900  vancomycin (VANCOCIN) 2,000 mg in sodium chloride 0.9 % 500 mL IVPB     2,000 mg 250 mL/hr over 120 Minutes Intravenous  Once 07/25/16 0835 07/25/16 1157   07/14/16 0900  cefTRIAXone (ROCEPHIN) 2 g in dextrose 5 % 50 mL IVPB     2 g 100 mL/hr over 30 Minutes Intravenous Every 24 hours 07/14/16 0814 07/21/16 0927   07/11/16 1830  vancomycin (VANCOCIN) IVPB 1000 mg/200 mL premix  Status:  Discontinued     1,000 mg 200 mL/hr over 60 Minutes Intravenous Every 8 hours 07/11/16 0952 07/13/16 0820   07/11/16 1030  vancomycin (VANCOCIN) 2,000 mg in sodium chloride 0.9 % 500 mL IVPB     2,000 mg 250 mL/hr over 120 Minutes Intravenous  Once 07/11/16 0952 07/11/16 1400   07/11/16 0945  piperacillin-tazobactam (ZOSYN) IVPB 3.375 g  Status:  Discontinued     3.375 g 12.5 mL/hr over 240 Minutes Intravenous Every 8 hours 07/11/16 0938 07/14/16 0814   07/08/16 2200  ceFAZolin (ANCEF) IVPB 1 g/50 mL premix  Status:  Discontinued     1 g 100 mL/hr over 30 Minutes Intravenous Every 8 hours 07/08/16 1415 07/11/16 0938   07/08/16 1300  ceFAZolin (ANCEF) IVPB 2g/100 mL premix     2 g 200 mL/hr over 30 Minutes Intravenous STAT 07/08/16 1252 07/08/16 1354      Best Practice/Protocols:  VTE Prophylaxis: Lovenox (prophylaxtic dose) and Mechanical GI Prophylaxis: Proton Pump Inhibitor Continous Sedation  Consults: Treatment Team:  Loura Halt Ditty, MD    Events:  Subjective:    Overnight Issues: Weaning on the ventilator this AM.  Spiked fevers.  Objective:  Vital signs for last 24 hours: Temp:  [98.7 F (37.1  C)-102.6 F (39.2 C)] 100.8 F (38.2 C) (01/24 0845) Pulse Rate:  [76-114] 114 (01/24 0845) Resp:  [16-35] 27 (01/24 0845) BP: (106-165)/(54-103) 127/67 (01/24 0808) SpO2:  [95 %-100 %] 96 % (01/24 0845) FiO2 (%):  [30 %-50 %] 30 % (01/24 0808) Weight:  [109.9 kg (242 lb 4.6 oz)] 109.9 kg (242 lb 4.6 oz) (01/24 0355)  Hemodynamic parameters for last 24 hours:    Intake/Output from previous day: 01/23 0701 - 01/24 0700 In: 3921.4 [I.V.:1431.4; NG/GT:1440; IV Piggyback:1050] Out: 3750 [Urine:3750]  Intake/Output this shift: No intake/output data recorded.  Vent settings for last 24 hours: Vent Mode: PSV;CPAP FiO2 (%):  [30 %-50 %] 30 % Set Rate:  [16 bmp] 16 bmp Vt Set:  [650 mL] 650 mL PEEP:  [5 cmH20] 5 cmH20 Pressure Support:  [5 cmH20] 5 cmH20 Plateau Pressure:  [19 cmH20-33 cmH20] 31 cmH20  Physical Exam:  General: no respiratory distress and would not awaken for me this AM Neuro: nonfocal exam and RASS -1 Resp: diminished breath sounds LUL and rhonchi LUL CVS: regular rate and rhythm, S1, S2 normal, no murmur, click, rub or gallop and some tachycardia GI: soft, nontender, BS WNL, no r/g and mildly distended but tolerating tube feedings well.  Had large bowel movement. Extremities: edema 1+  Results for orders placed or performed during the hospital encounter of 07/08/16 (from the past 24 hour(s))  Glucose, capillary     Status: Abnormal   Collection Time: 07/25/16 12:20 PM  Result Value Ref Range   Glucose-Capillary 177 (H) 65 - 99 mg/dL  Glucose, capillary     Status: Abnormal   Collection Time: 07/25/16  3:23 PM  Result Value Ref Range   Glucose-Capillary 217 (H) 65 - 99 mg/dL   Comment 1 Notify RN    Comment 2 Document in Chart   Glucose, capillary     Status: Abnormal   Collection Time: 07/25/16  8:04 PM  Result Value Ref Range   Glucose-Capillary 139 (H) 65 - 99 mg/dL  Glucose, capillary     Status: Abnormal   Collection Time: 07/25/16 11:37 PM  Result  Value Ref Range   Glucose-Capillary 137 (H) 65 - 99 mg/dL  Glucose, capillary     Status: Abnormal   Collection Time: 07/26/16  3:33 AM  Result Value Ref Range   Glucose-Capillary 208 (H) 65 - 99 mg/dL  CBC     Status: Abnormal   Collection Time: 07/26/16  6:29 AM  Result Value Ref Range   WBC 21.1 (H) 4.0 - 10.5 K/uL   RBC 2.31 (L) 4.22 - 5.81 MIL/uL   Hemoglobin 7.2 (L) 13.0 - 17.0 g/dL   HCT 40.923.0 (L) 81.139.0 - 91.452.0 %   MCV 99.6 78.0 - 100.0 fL   MCH 31.2 26.0 - 34.0 pg   MCHC 31.3 30.0 - 36.0 g/dL   RDW 78.213.2 95.611.5 - 21.315.5 %   Platelets 373 150 - 400 K/uL  Basic metabolic panel     Status: Abnormal   Collection Time: 07/26/16  6:29 AM  Result Value Ref Range   Sodium 137 135 - 145 mmol/L   Potassium 3.5 3.5 - 5.1 mmol/L   Chloride 100 (L) 101 - 111 mmol/L   CO2 28 22 - 32 mmol/L   Glucose, Bld 136 (H) 65 - 99 mg/dL   BUN 18 6 - 20 mg/dL   Creatinine, Ser 0.860.80 0.61 - 1.24 mg/dL   Calcium 8.4 (L) 8.9 - 10.3 mg/dL  GFR calc non Af Amer >60 >60 mL/min   GFR calc Af Amer >60 >60 mL/min   Anion gap 9 5 - 15  Glucose, capillary     Status: Abnormal   Collection Time: 07/26/16  8:35 AM  Result Value Ref Range   Glucose-Capillary 159 (H) 65 - 99 mg/dL     Assessment/Plan:   NEURO  Altered Mental Status:  agitation and sedation   Plan: I was told that he had followed some commands, but would not do so for me in the 5 ;minutes I was in attendance.  PULM  Atelectasis/collapse (focal)   Plan: Suspect LUL atelectasis or consolidation based on physical examination.  CARDIO  Sinus Tachycardia   Plan: No specifically treatable.  RENAL  Urine output and renal function are good   Plan: May get some diuresis   GI  No specific issues   Plan: Continue tube feedings.  Has a sacral decubitus.  May consider FlexiSeal if diarrhea persists  ID  Pneumonia (hospital acquired (not ventilator-associated) Staph pneumonia which is sensitive, but blood cultures have MRSA) Positive blood  cultures fo Staph   Plan: On Vanco and Zosyn  HEME  Anemia acute blood loss anemia)   Plan: Hemoglobin down a bit, no blood for now but will recheck cbc later today.  ENDO Hyperglycemia (stress related and not known to be diabetic)   Plan: Adjust coverage as can be done  Global Issues  Patient is weaning, but with the plugging I suspect on his left side I do not believe that he would sustain himself off the ventilator.  CXr is clear, but the diminished breath sounds on the left started when the patient started to wean.    LOS: 18 days   Additional comments:I reviewed the patient's new clinical lab test results. cbc/bmet and I reviewed the patients new imaging test results. cxr  Critical Care Total Time*: 30 Minutes  Gabreal Worton 07/26/2016  *Care during the described time interval was provided by me and/or other providers on the critical care team.  I have reviewed this patient's available data, including medical history, events of note, physical examination and test results as part of my evaluation.
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
## 2016-07-27 ENCOUNTER — Inpatient Hospital Stay (HOSPITAL_COMMUNITY): Payer: Medicaid Other

## 2016-07-27 DIAGNOSIS — J15211 Pneumonia due to Methicillin susceptible Staphylococcus aureus: Secondary | ICD-10-CM

## 2016-07-27 LAB — CBC
HCT: 19.1 % — ABNORMAL LOW (ref 39.0–52.0)
HCT: 28.1 % — ABNORMAL LOW (ref 39.0–52.0)
HEMOGLOBIN: 8.9 g/dL — AB (ref 13.0–17.0)
Hemoglobin: 6.2 g/dL — CL (ref 13.0–17.0)
MCH: 32.3 pg (ref 26.0–34.0)
MCH: 30.3 pg (ref 26.0–34.0)
MCHC: 32.5 g/dL (ref 30.0–36.0)
MCHC: 31.7 g/dL (ref 30.0–36.0)
MCV: 99.5 fL (ref 78.0–100.0)
MCV: 95.6 fL (ref 78.0–100.0)
Platelets: 245 10*3/uL (ref 150–400)
Platelets: 347 10*3/uL (ref 150–400)
RBC: 1.92 MIL/uL — ABNORMAL LOW (ref 4.22–5.81)
RBC: 2.94 MIL/uL — AB (ref 4.22–5.81)
RDW: 13.3 % (ref 11.5–15.5)
RDW: 16.5 % — ABNORMAL HIGH (ref 11.5–15.5)
WBC: 8 10*3/uL (ref 4.0–10.5)
WBC: 7.6 10*3/uL (ref 4.0–10.5)

## 2016-07-27 LAB — GLUCOSE, CAPILLARY
GLUCOSE-CAPILLARY: 117 mg/dL — AB (ref 65–99)
GLUCOSE-CAPILLARY: 147 mg/dL — AB (ref 65–99)
Glucose-Capillary: 127 mg/dL — ABNORMAL HIGH (ref 65–99)
Glucose-Capillary: 142 mg/dL — ABNORMAL HIGH (ref 65–99)
Glucose-Capillary: 144 mg/dL — ABNORMAL HIGH (ref 65–99)
Glucose-Capillary: 157 mg/dL — ABNORMAL HIGH (ref 65–99)

## 2016-07-27 LAB — PREPARE RBC (CROSSMATCH)

## 2016-07-27 LAB — BASIC METABOLIC PANEL WITH GFR
Anion gap: 5 (ref 5–15)
BUN: 13 mg/dL (ref 6–20)
CO2: 26 mmol/L (ref 22–32)
Calcium: 7.5 mg/dL — ABNORMAL LOW (ref 8.9–10.3)
Chloride: 109 mmol/L (ref 101–111)
Creatinine, Ser: 0.63 mg/dL (ref 0.61–1.24)
GFR calc Af Amer: >60 mL/min
GFR calc non Af Amer: >60 mL/min
Glucose, Bld: 131 mg/dL — ABNORMAL HIGH (ref 65–99)
Potassium: 3.1 mmol/L — ABNORMAL LOW (ref 3.5–5.1)
Sodium: 140 mmol/L (ref 135–145)

## 2016-07-27 LAB — CULTURE, BLOOD (ROUTINE X 2)

## 2016-07-27 MED ORDER — SODIUM CHLORIDE 0.9 % IV SOLN
Freq: Once | INTRAVENOUS | Status: AC
  Administered 2016-07-27: 09:00:00 via INTRAVENOUS

## 2016-07-27 MED ORDER — GLUCERNA 1.2 CAL PO LIQD
1000.0000 mL | ORAL | Status: DC
  Administered 2016-07-27 – 2016-08-10 (×18): 1000 mL
  Filled 2016-07-27 (×26): qty 1000

## 2016-07-27 MED ORDER — SODIUM CHLORIDE 0.9 % IV SOLN: Freq: Once | INTRAVENOUS | Status: DC

## 2016-07-27 MED ORDER — POTASSIUM CHLORIDE 20 MEQ/15ML (10%) PO SOLN
40.0000 meq | Freq: Two times a day (BID) | ORAL | Status: AC
  Administered 2016-07-27 (×2): 40 meq
  Filled 2016-07-27 (×2): qty 30

## 2016-07-27 NOTE — Plan of Care (Signed)
Problem: Phase I Progression Outcomes Goal: Cervical/thoracic/lumbar spine cleared Outcome: Not Met (add Reason) Unable to participate in flex/extenion x ray at this time
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
## 2016-07-27 NOTE — Progress Notes (Signed)
[facility]   Reason for visit: Follow up on mssa pneumonia  Interval History: afebrile since yesterday, WBC down to normal from 21;  Physical Exam: Constitutional:  Vitals:   07/27/16 1600 07/27/16 1700  BP: (!) 145/94 (!) 148/94  Pulse: 88 85  Resp: (!) 27 (!) 24  Temp: 100 F (37.8 C)   intubated, no response Respiratory: on vent; CTA B Cardiovascular: RRR GI: soft  Review of Systems: Unable to be assessed due to mental status  Lab Results  Component Value Date   WBC 7.6 07/27/2016   HGB 8.9 (L) 07/27/2016   HCT 28.1 (L) 07/27/2016   MCV 95.6 07/27/2016   PLT 347 07/27/2016    Lab Results  Component Value Date   CREATININE 0.63 07/27/2016   BUN 13 07/27/2016   NA 140 07/27/2016   K 3.1 (L) 07/27/2016   CL 109 07/27/2016   CO2 26 07/27/2016    Lab Results  Component Value Date   ALT 24 07/17/2016   AST 19 07/17/2016   ALKPHOS 61 07/17/2016     Microbiology: Recent Results (from the past 240 hour(s))  Culture, respiratory (NON-Expectorated)     Status: None   Collection Time: 07/23/16  9:11 AM  Result Value Ref Range Status   Specimen Description TRACHEAL ASPIRATE  Final   Special Requests Normal  Final   Gram Stain   Final    ABUNDANT WBC PRESENT, PREDOMINANTLY PMN ABUNDANT GRAM POSITIVE COCCI IN PAIRS MODERATE GRAM POSITIVE COCCI IN CLUSTERS MODERATE GRAM POSITIVE RODS    Culture MODERATE STAPHYLOCOCCUS AUREUS  Final   Report Status 07/25/2016 FINAL  Final   Organism ID, Bacteria STAPHYLOCOCCUS AUREUS  Final      Susceptibility   Staphylococcus aureus - MIC*    CIPROFLOXACIN <=0.5 SENSITIVE Sensitive     ERYTHROMYCIN 0.5 SENSITIVE Sensitive     GENTAMICIN <=0.5 SENSITIVE Sensitive     OXACILLIN 0.5 SENSITIVE Sensitive     TETRACYCLINE <=1 SENSITIVE Sensitive     VANCOMYCIN <=0.5 SENSITIVE Sensitive     TRIMETH/SULFA <=10 SENSITIVE Sensitive     CLINDAMYCIN <=0.25 SENSITIVE Sensitive     RIFAMPIN <=0.5  SENSITIVE Sensitive     Inducible Clindamycin NEGATIVE Sensitive     * MODERATE STAPHYLOCOCCUS AUREUS  Culture, Urine     Status: Abnormal   Collection Time: 07/24/16 12:15 PM  Result Value Ref Range Status   Specimen Description URINE, RANDOM  Final   Special Requests Normal  Final   Culture <10,000 COLONIES/mL INSIGNIFICANT GROWTH (A)  Final   Report Status 07/25/2016 FINAL  Final  Culture, blood (routine x 2)     Status: None (Preliminary result)   Collection Time: 07/24/16  1:50 PM  Result Value Ref Range Status   Specimen Description BLOOD BLOOD LEFT ARM  Final   Special Requests BOTTLES DRAWN AEROBIC AND ANAEROBIC 10CC  Final   Culture NO GROWTH 3 DAYS  Final   Report Status PENDING  Incomplete  Culture, blood (routine x 2)     Status: Abnormal   Collection Time: 07/24/16  2:00 PM  Result Value Ref Range Status   Specimen Description BLOOD LEFT ANTECUBITAL  Final   Special Requests BOTTLES DRAWN AEROBIC AND ANAEROBIC 10CC  Final   Culture  Setup Time   Final    GRAM POSITIVE COCCI IN CLUSTERS IN BOTH AEROBIC AND ANAEROBIC BOTTLES CRITICAL RESULT CALLED TO, READ BACK BY AND VERIFIED WITH: L CURRAN PHARMD 1530  07/25/16 M WILSON    Culture (A)  Final    STAPHYLOCOCCUS SPECIES (COAGULASE NEGATIVE) THE SIGNIFICANCE OF ISOLATING THIS ORGANISM FROM A SINGLE SET OF BLOOD CULTURES WHEN MULTIPLE SETS ARE DRAWN IS UNCERTAIN. PLEASE NOTIFY THE MICROBIOLOGY DEPARTMENT WITHIN ONE WEEK IF SPECIATION AND SENSITIVITIES ARE REQUIRED.    Report Status 07/27/2016 FINAL  Final  Blood Culture ID Panel (Reflexed)     Status: Abnormal   Collection Time: 07/24/16  2:00 PM  Result Value Ref Range Status   Enterococcus species NOT DETECTED NOT DETECTED Final   Listeria monocytogenes NOT DETECTED NOT DETECTED Final   Staphylococcus species DETECTED (A) NOT DETECTED Final    Comment: CRITICAL RESULT CALLED TO, READ BACK BY AND VERIFIED WITH: LHosie Poisson. Curran Pharm.D. 15:30 07/25/16 (wilsonm)     Staphylococcus aureus NOT DETECTED NOT DETECTED Final   Methicillin resistance DETECTED (A) NOT DETECTED Final    Comment: CRITICAL RESULT CALLED TO, READ BACK BY AND VERIFIED WITH: LHosie Poisson. Curran Pharm.D. 15:30 07/25/16 (wilsonm)    Streptococcus species NOT DETECTED NOT DETECTED Final   Streptococcus agalactiae NOT DETECTED NOT DETECTED Final   Streptococcus pneumoniae NOT DETECTED NOT DETECTED Final   Streptococcus pyogenes NOT DETECTED NOT DETECTED Final   Acinetobacter baumannii NOT DETECTED NOT DETECTED Final   Enterobacteriaceae species NOT DETECTED NOT DETECTED Final   Enterobacter cloacae complex NOT DETECTED NOT DETECTED Final   Escherichia coli NOT DETECTED NOT DETECTED Final   Klebsiella oxytoca NOT DETECTED NOT DETECTED Final   Klebsiella pneumoniae NOT DETECTED NOT DETECTED Final   Proteus species NOT DETECTED NOT DETECTED Final   Serratia marcescens NOT DETECTED NOT DETECTED Final   Haemophilus influenzae NOT DETECTED NOT DETECTED Final   Neisseria meningitidis NOT DETECTED NOT DETECTED Final   Pseudomonas aeruginosa NOT DETECTED NOT DETECTED Final   Candida albicans NOT DETECTED NOT DETECTED Final   Candida glabrata NOT DETECTED NOT DETECTED Final   Candida krusei NOT DETECTED NOT DETECTED Final   Candida parapsilosis NOT DETECTED NOT DETECTED Final   Candida tropicalis NOT DETECTED NOT DETECTED Final    Impression/Plan:  1.  Fever - responding to ceftriaxone, likely pneumonia.  Leukocytosis improved.  2.  Blood culture - remains 1/2 positive. Most c/w contaminate.

## 2016-07-27 NOTE — Progress Notes (Signed)
CRITICAL VALUE ALERT  Critical value received:  6.2 Hgb  Date of notification:  07/27/16  Time of notification:  0605  Critical value read back:Yes.    Nurse who received alert:  TK  MD notified (1st page):  Trauma  Time of first page:  0610  MD notified (2nd page):  Time of second page:  Responding MD:  Trauma  Time MD responded:  [phone]
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
## 2016-07-28 ENCOUNTER — Inpatient Hospital Stay (HOSPITAL_COMMUNITY): Payer: Medicaid Other

## 2016-07-28 LAB — GLUCOSE, CAPILLARY
GLUCOSE-CAPILLARY: 118 mg/dL — AB (ref 65–99)
GLUCOSE-CAPILLARY: 145 mg/dL — AB (ref 65–99)
GLUCOSE-CAPILLARY: 164 mg/dL — AB (ref 65–99)
GLUCOSE-CAPILLARY: 199 mg/dL — AB (ref 65–99)
Glucose-Capillary: 153 mg/dL — ABNORMAL HIGH (ref 65–99)
Glucose-Capillary: 170 mg/dL — ABNORMAL HIGH (ref 65–99)
Glucose-Capillary: 127 mg/dL — ABNORMAL HIGH (ref 65–99)

## 2016-07-28 LAB — BLOOD GAS, ARTERIAL
ACID-BASE EXCESS: 6.7 mmol/L — AB (ref 0.0–2.0)
BICARBONATE: 30.6 mmol/L — AB (ref 20.0–28.0)
DRAWN BY: 39899
FIO2: 30
O2 Saturation: 92.6 %
PEEP: 5 cmH2O
PH ART: 7.46 — AB (ref 7.350–7.450)
Patient temperature: 98.4
Pressure support: 5 cmH2O
pCO2 arterial: 43.6 mmHg (ref 32.0–48.0)
pO2, Arterial: 65 mmHg — ABNORMAL LOW (ref 83.0–108.0)

## 2016-07-28 LAB — CBC
HEMATOCRIT: 27.5 % — AB (ref 39.0–52.0)
HEMOGLOBIN: 9 g/dL — AB (ref 13.0–17.0)
MCH: 31 pg (ref 26.0–34.0)
MCHC: 32.7 g/dL (ref 30.0–36.0)
MCV: 94.8 fL (ref 78.0–100.0)
Platelets: 362 10*3/uL (ref 150–400)
RBC: 2.9 MIL/uL — AB (ref 4.22–5.81)
RDW: 16.3 % — ABNORMAL HIGH (ref 11.5–15.5)
WBC: 5.7 10*3/uL (ref 4.0–10.5)

## 2016-07-28 LAB — TYPE AND SCREEN
ABO/RH(D): O POS
Antibody Screen: NEGATIVE
UNIT DIVISION: 0
UNIT DIVISION: 0

## 2016-07-28 LAB — BASIC METABOLIC PANEL
ANION GAP: 7 (ref 5–15)
BUN: 15 mg/dL (ref 6–20)
CO2: 29 mmol/L (ref 22–32)
Calcium: 8.7 mg/dL — ABNORMAL LOW (ref 8.9–10.3)
Chloride: 102 mmol/L (ref 101–111)
Creatinine, Ser: 0.65 mg/dL (ref 0.61–1.24)
GFR calc Af Amer: >60 mL/min (ref >60)
GFR calc non Af Amer: >60 mL/min (ref >60)
GLUCOSE: 159 mg/dL — AB (ref 65–99)
POTASSIUM: 4 mmol/L (ref 3.5–5.1)
Sodium: 138 mmol/L (ref 135–145)

## 2016-07-28 MED ORDER — FUROSEMIDE 10 MG/ML PO SOLN
40.0000 mg | Freq: Two times a day (BID) | ORAL | Status: DC
  Administered 2016-07-28 – 2016-07-29 (×2): 40 mg
  Filled 2016-07-28 (×2): qty 4

## 2016-07-28 MED ORDER — FUROSEMIDE 10 MG/ML IJ SOLN
INTRAMUSCULAR | Status: AC
  Filled 2016-07-28: qty 4

## 2016-07-28 MED ORDER — FUROSEMIDE 10 MG/ML IJ SOLN
40.0000 mg | Freq: Once | INTRAMUSCULAR | Status: AC
  Administered 2016-07-28: 40 mg via INTRAVENOUS
  Filled 2016-07-28: qty 4

## 2016-07-28 NOTE — Progress Notes (Signed)
PT Cancellation Note  Patient Details Name: [name] MRN: [id number] DOB: 08-13-1980   Cancelled Treatment:    Reason Eval/Treat Not Completed: Patient at procedure or test/unavailable.  Pt just left for CT.     Aundra MilletMegan F Markes Shatswell 07/28/2016, 11:22 AM
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
## 2016-07-28 NOTE — Progress Notes (Signed)
[facility]   Reason for visit: Follow up on mssa pneumonia  Interval History: remains afebrile; no response; WBC remains wnl  Physical Exam: Constitutional:  Vitals:   07/28/16 0900 07/28/16 1000  BP: (!) 152/94 (!) 152/81  Pulse: 89 93  Resp: (!) 21 (!) 26  Temp:    intubated, no response Respiratory: on vent; CTA B Cardiovascular: RRR GI: soft  Review of Systems: Unable to be assessed due to mental status  Lab Results  Component Value Date   WBC 5.7 07/28/2016   HGB 9.0 (L) 07/28/2016   HCT 27.5 (L) 07/28/2016   MCV 94.8 07/28/2016   PLT 362 07/28/2016    Lab Results  Component Value Date   CREATININE 0.65 07/28/2016   BUN 15 07/28/2016   NA 138 07/28/2016   K 4.0 07/28/2016   CL 102 07/28/2016   CO2 29 07/28/2016    Lab Results  Component Value Date   ALT 24 07/17/2016   AST 19 07/17/2016   ALKPHOS 61 07/17/2016     Microbiology: Recent Results (from the past 240 hour(s))  Culture, respiratory (NON-Expectorated)     Status: None   Collection Time: 07/23/16  9:11 AM  Result Value Ref Range Status   Specimen Description TRACHEAL ASPIRATE  Final   Special Requests Normal  Final   Gram Stain   Final    ABUNDANT WBC PRESENT, PREDOMINANTLY PMN ABUNDANT GRAM POSITIVE COCCI IN PAIRS MODERATE GRAM POSITIVE COCCI IN CLUSTERS MODERATE GRAM POSITIVE RODS    Culture MODERATE STAPHYLOCOCCUS AUREUS  Final   Report Status 07/25/2016 FINAL  Final   Organism ID, Bacteria STAPHYLOCOCCUS AUREUS  Final      Susceptibility   Staphylococcus aureus - MIC*    CIPROFLOXACIN <=0.5 SENSITIVE Sensitive     ERYTHROMYCIN 0.5 SENSITIVE Sensitive     GENTAMICIN <=0.5 SENSITIVE Sensitive     OXACILLIN 0.5 SENSITIVE Sensitive     TETRACYCLINE <=1 SENSITIVE Sensitive     VANCOMYCIN <=0.5 SENSITIVE Sensitive     TRIMETH/SULFA <=10 SENSITIVE Sensitive     CLINDAMYCIN <=0.25 SENSITIVE Sensitive     RIFAMPIN <=0.5 SENSITIVE Sensitive     Inducible  Clindamycin NEGATIVE Sensitive     * MODERATE STAPHYLOCOCCUS AUREUS  Culture, Urine     Status: Abnormal   Collection Time: 07/24/16 12:15 PM  Result Value Ref Range Status   Specimen Description URINE, RANDOM  Final   Special Requests Normal  Final   Culture <10,000 COLONIES/mL INSIGNIFICANT GROWTH (A)  Final   Report Status 07/25/2016 FINAL  Final  Culture, blood (routine x 2)     Status: None (Preliminary result)   Collection Time: 07/24/16  1:50 PM  Result Value Ref Range Status   Specimen Description BLOOD BLOOD LEFT ARM  Final   Special Requests BOTTLES DRAWN AEROBIC AND ANAEROBIC 10CC  Final   Culture NO GROWTH 3 DAYS  Final   Report Status PENDING  Incomplete  Culture, blood (routine x 2)     Status: Abnormal   Collection Time: 07/24/16  2:00 PM  Result Value Ref Range Status   Specimen Description BLOOD LEFT ANTECUBITAL  Final   Special Requests BOTTLES DRAWN AEROBIC AND ANAEROBIC 10CC  Final   Culture  Setup Time   Final    GRAM POSITIVE COCCI IN CLUSTERS IN BOTH AEROBIC AND ANAEROBIC BOTTLES CRITICAL RESULT CALLED TO, READ BACK BY AND VERIFIED WITHLelon Huh: L CURRAN [facility] LL[facility]MD 1530 07/25/16 Elson ClanM WILSON  Culture (A)  Final    STAPHYLOCOCCUS SPECIES (COAGULASE NEGATIVE) THE SIGNIFICANCE OF ISOLATING THIS ORGANISM FROM A SINGLE SET OF BLOOD CULTURES WHEN MULTIPLE SETS ARE DRAWN IS UNCERTAIN. PLEASE NOTIFY THE MICROBIOLOGY DEPARTMENT WITHIN ONE WEEK IF SPECIATION AND SENSITIVITIES ARE REQUIRED.    Report Status 07/27/2016 FINAL  Final  Blood Culture ID Panel (Reflexed)     Status: Abnormal   Collection Time: 07/24/16  2:00 PM  Result Value Ref Range Status   Enterococcus species NOT DETECTED NOT DETECTED Final   Listeria monocytogenes NOT DETECTED NOT DETECTED Final   Staphylococcus species DETECTED (A) NOT DETECTED Final    Comment: CRITICAL RESULT CALLED TO, READ BACK BY AND VERIFIED WITH: LHosie Poisson.D. 15:30 07/25/16 (wilsonm)    Staphylococcus aureus NOT DETECTED NOT DETECTED  Final   Methicillin resistance DETECTED (A) NOT DETECTED Final    Comment: CRITICAL RESULT CALLED TO, READ BACK BY AND VERIFIED WITH: LHosie Poisson.D. 15:30 07/25/16 (wilsonm)    Streptococcus species NOT DETECTED NOT DETECTED Final   Streptococcus agalactiae NOT DETECTED NOT DETECTED Final   Streptococcus pneumoniae NOT DETECTED NOT DETECTED Final   Streptococcus pyogenes NOT DETECTED NOT DETECTED Final   Acinetobacter baumannii NOT DETECTED NOT DETECTED Final   Enterobacteriaceae species NOT DETECTED NOT DETECTED Final   Enterobacter cloacae complex NOT DETECTED NOT DETECTED Final   Escherichia coli NOT DETECTED NOT DETECTED Final   Klebsiella oxytoca NOT DETECTED NOT DETECTED Final   Klebsiella pneumoniae NOT DETECTED NOT DETECTED Final   Proteus species NOT DETECTED NOT DETECTED Final   Serratia marcescens NOT DETECTED NOT DETECTED Final   Haemophilus influenzae NOT DETECTED NOT DETECTED Final   Neisseria meningitidis NOT DETECTED NOT DETECTED Final   Pseudomonas aeruginosa NOT DETECTED NOT DETECTED Final   Candida albicans NOT DETECTED NOT DETECTED Final   Candida glabrata NOT DETECTED NOT DETECTED Final   Candida krusei NOT DETECTED NOT DETECTED Final   Candida parapsilosis NOT DETECTED NOT DETECTED Final   Candida tropicalis NOT DETECTED NOT DETECTED Final    Impression/Plan:  1.  Fever - responding to ceftriaxone, likely pneumonia.  Leukocytosis improved.  I will put in stop date for ceftriaxone for 5 more days  2.  Blood culture - remains 1/2 positive. Most c/w contaminate.    I will sign off, please call with questions

## 2016-07-28 NOTE — Progress Notes (Signed)
Wasted precedex gtt. 90 ml. Placed in sharps bin. Witnessed by Chesapeake Energykim meyren rn

## 2016-07-28 NOTE — Progress Notes (Signed)
Follow up - Trauma and Critical Care  Patient Details:    [name] is an 36 y.o. male.  Lines/tubes : Airway 7.5 mm (Active)  Secured at (cm) 24 cm 07/28/2016  7:30 AM  Measured From Lips 07/28/2016  7:30 AM  Secured Location Left 07/28/2016  7:30 AM  Secured By [employer] 07/28/2016  7:30 AM  Tube Holder Repositioned Yes 07/28/2016  7:30 AM  Cuff Pressure (cm H2O) 28 cm H2O 07/28/2016  7:30 AM  Site Condition Dry 07/28/2016  7:30 AM     PICC Triple Lumen 07/09/16 PICC Right Brachial 44 cm 0 cm (Active)  Indication for Insertion or Continuance of Line Prolonged intravenous therapies 07/28/2016  7:49 AM  Exposed Catheter (cm) 0 cm 07/23/2016  8:00 PM  Site Assessment Clean;Dry;Intact 07/27/2016  8:00 PM  Lumen #1 Status Infusing;Flushed;Blood return noted 07/27/2016  8:00 PM  Lumen #2 Status Infusing;Flushed;Blood return noted 07/27/2016  8:00 PM  Lumen #3 Status Infusing 07/27/2016  8:00 PM  Dressing Type Transparent;Occlusive 07/27/2016  8:00 PM  Dressing Status Clean;Dry;Antimicrobial disc in place;Intact 07/27/2016  8:00 PM  Line Care Connections checked and tightened 07/27/2016  8:00 PM  Line Adjustment (NICU/IV Team Only) No 07/24/2016  8:00 AM  Dressing Intervention Dressing reinforced 07/23/2016  8:00 PM  Dressing Change Due 07/30/16 07/24/2016  7:45 PM     NG/OG Tube Orogastric 18 Fr. Center mouth Xray (Active)  External Length of Tube (cm) - (if applicable) 56 cm 07/23/2016  8:00 PM  Site Assessment Clean;Dry;Intact 07/27/2016  8:00 PM  Ongoing Placement Verification No change in cm markings or external length of tube from initial placement;No change in respiratory status 07/27/2016  8:00 PM  Status Infusing tube feed 07/27/2016  8:00 PM  Drainage Appearance Bile 07/23/2016  8:00 PM  Intake (mL) 100 mL 07/24/2016  1:00 PM  Output (mL) 400 mL 07/12/2016 11:00 AM     External Urinary Catheter (Active)  Collection Container Standard drainage bag 07/27/2016  8:00 PM  Securement  Method Tape 07/27/2016  8:00 PM  Output (mL) 850 mL 07/26/2016  1:00 PM    Microbiology/Sepsis markers: Results for orders placed or performed during the hospital encounter of 07/08/16  Culture, Urine     Status: None   Collection Time: 07/11/16 10:20 AM  Result Value Ref Range Status   Specimen Description URINE, CATHETERIZED  Final   Special Requests NONE  Final   Culture NO GROWTH  Final   Report Status 07/12/2016 FINAL  Final  Culture, blood (routine x 2)     Status: None   Collection Time: 07/11/16 11:06 AM  Result Value Ref Range Status   Specimen Description BLOOD BLOOD LEFT FOREARM  Final   Special Requests IN PEDIATRIC BOTTLE 3CC  Final   Culture NO GROWTH 5 DAYS  Final   Report Status 07/16/2016 FINAL  Final  Culture, blood (routine x 2)     Status: None   Collection Time: 07/11/16 11:06 AM  Result Value Ref Range Status   Specimen Description BLOOD BLOOD LEFT FOREARM  Final   Special Requests IN PEDIATRIC BOTTLE 2.5CC  Final   Culture NO GROWTH 5 DAYS  Final   Report Status 07/16/2016 FINAL  Final  Culture, respiratory (NON-Expectorated)     Status: None   Collection Time: 07/11/16 11:25 AM  Result Value Ref Range Status   Specimen Description TRACHEAL ASPIRATE  Final   Special Requests NONE  Final   Gram Stain  Final    MODERATE WBC PRESENT, PREDOMINANTLY MONONUCLEAR RARE GRAM POSITIVE COCCI IN PAIRS    Culture   Final    ABUNDANT HAEMOPHILUS INFLUENZAE BETA LACTAMASE NEGATIVE    Report Status 07/13/2016 FINAL  Final  MRSA PCR Screening     Status: None   Collection Time: 07/16/16  1:58 AM  Result Value Ref Range Status   MRSA by PCR NEGATIVE NEGATIVE Final    Comment:        The GeneXpert MRSA Assay (FDA approved for NASAL specimens only), is one component of a comprehensive MRSA colonization surveillance program. It is not intended to diagnose MRSA infection nor to guide or monitor treatment for MRSA infections.   Culture, respiratory  (NON-Expectorated)     Status: None   Collection Time: 07/23/16  9:11 AM  Result Value Ref Range Status   Specimen Description TRACHEAL ASPIRATE  Final   Special Requests Normal  Final   Gram Stain   Final    ABUNDANT WBC PRESENT, PREDOMINANTLY PMN ABUNDANT GRAM POSITIVE COCCI IN PAIRS MODERATE GRAM POSITIVE COCCI IN CLUSTERS MODERATE GRAM POSITIVE RODS    Culture MODERATE STAPHYLOCOCCUS AUREUS  Final   Report Status 07/25/2016 FINAL  Final   Organism ID, Bacteria STAPHYLOCOCCUS AUREUS  Final      Susceptibility   Staphylococcus aureus - MIC*    CIPROFLOXACIN <=0.5 SENSITIVE Sensitive     ERYTHROMYCIN 0.5 SENSITIVE Sensitive     GENTAMICIN <=0.5 SENSITIVE Sensitive     OXACILLIN 0.5 SENSITIVE Sensitive     TETRACYCLINE <=1 SENSITIVE Sensitive     VANCOMYCIN <=0.5 SENSITIVE Sensitive     TRIMETH/SULFA <=10 SENSITIVE Sensitive     CLINDAMYCIN <=0.25 SENSITIVE Sensitive     RIFAMPIN <=0.5 SENSITIVE Sensitive     Inducible Clindamycin NEGATIVE Sensitive     * MODERATE STAPHYLOCOCCUS AUREUS  Culture, Urine     Status: Abnormal   Collection Time: 07/24/16 12:15 PM  Result Value Ref Range Status   Specimen Description URINE, RANDOM  Final   Special Requests Normal  Final   Culture <10,000 COLONIES/mL INSIGNIFICANT GROWTH (A)  Final   Report Status 07/25/2016 FINAL  Final  Culture, blood (routine x 2)     Status: None (Preliminary result)   Collection Time: 07/24/16  1:50 PM  Result Value Ref Range Status   Specimen Description BLOOD BLOOD LEFT ARM  Final   Special Requests BOTTLES DRAWN AEROBIC AND ANAEROBIC 10CC  Final   Culture NO GROWTH 3 DAYS  Final   Report Status PENDING  Incomplete  Culture, blood (routine x 2)     Status: Abnormal   Collection Time: 07/24/16  2:00 PM  Result Value Ref Range Status   Specimen Description BLOOD LEFT ANTECUBITAL  Final   Special Requests BOTTLES DRAWN AEROBIC AND ANAEROBIC 10CC  Final   Culture  Setup Time   Final    GRAM POSITIVE COCCI  IN CLUSTERS IN BOTH AEROBIC AND ANAEROBIC BOTTLES CRITICAL RESULT CALLED TO, READ BACK BY AND VERIFIED WITH: Lelon Huh PHARMD 1530 07/25/16 M WILSON    Culture (A)  Final    STAPHYLOCOCCUS SPECIES (COAGULASE NEGATIVE) THE SIGNIFICANCE OF ISOLATING THIS ORGANISM FROM A SINGLE SET OF BLOOD CULTURES WHEN MULTIPLE SETS ARE DRAWN IS UNCERTAIN. PLEASE NOTIFY THE MICROBIOLOGY DEPARTMENT WITHIN ONE WEEK IF SPECIATION AND SENSITIVITIES ARE REQUIRED.    Report Status 07/27/2016 FINAL  Final  Blood Culture ID Panel (Reflexed)     Status: Abnormal   Collection Time: 07/24/16  2:00 PM  Result Value Ref Range Status   Enterococcus species NOT DETECTED NOT DETECTED Final   Listeria monocytogenes NOT DETECTED NOT DETECTED Final   Staphylococcus species DETECTED (A) NOT DETECTED Final    Comment: CRITICAL RESULT CALLED TO, READ BACK BY AND VERIFIED WITH: LHosie Poisson. Curran Pharm.D. 15:30 07/25/16 ([email or web address])    Staphylococcus aureus NOT DETECTED NOT DETECTED Final   Methicillin resistance DETECTED (A) NOT DETECTED Final    Comment: CRITICAL RESULT CALLED TO, READ BACK BY AND VERIFIED WITH: LHosie Poisson. Curran Pharm.D. 15:30 07/25/16 ([email or web address])    Streptococcus species NOT DETECTED NOT DETECTED Final   Streptococcus agalactiae NOT DETECTED NOT DETECTED Final   Streptococcus pneumoniae NOT DETECTED NOT DETECTED Final   Streptococcus pyogenes NOT DETECTED NOT DETECTED Final   Acinetobacter baumannii NOT DETECTED NOT DETECTED Final   Enterobacteriaceae species NOT DETECTED NOT DETECTED Final   Enterobacter cloacae complex NOT DETECTED NOT DETECTED Final   Escherichia coli NOT DETECTED NOT DETECTED Final   Klebsiella oxytoca NOT DETECTED NOT DETECTED Final   Klebsiella pneumoniae NOT DETECTED NOT DETECTED Final   Proteus species NOT DETECTED NOT DETECTED Final   Serratia marcescens NOT DETECTED NOT DETECTED Final   Haemophilus influenzae NOT DETECTED NOT DETECTED Final   Neisseria meningitidis NOT DETECTED NOT DETECTED Final    Pseudomonas aeruginosa NOT DETECTED NOT DETECTED Final   Candida albicans NOT DETECTED NOT DETECTED Final   Candida glabrata NOT DETECTED NOT DETECTED Final   Candida krusei NOT DETECTED NOT DETECTED Final   Candida parapsilosis NOT DETECTED NOT DETECTED Final   Candida tropicalis NOT DETECTED NOT DETECTED Final    Anti-infectives:  Anti-infectives    Start     Dose/Rate Route Frequency Ordered Stop   07/26/16 1200  cefTRIAXone (ROCEPHIN) 2 g in dextrose 5 % 50 mL IVPB     2 g 100 mL/hr over 30 Minutes Intravenous Every 24 hours 07/26/16 1159     07/25/16 1800  vancomycin (VANCOCIN) IVPB 1000 mg/200 mL premix  Status:  Discontinued     1,000 mg 200 mL/hr over 60 Minutes Intravenous Every 8 hours 07/25/16 0835 07/26/16 1159   07/25/16 0900  piperacillin-tazobactam (ZOSYN) IVPB 3.375 g  Status:  Discontinued     3.375 g 12.5 mL/hr over 240 Minutes Intravenous Every 8 hours 07/25/16 0820 07/26/16 1159   07/25/16 0900  vancomycin (VANCOCIN) 2,000 mg in sodium chloride 0.9 % 500 mL IVPB     2,000 mg 250 mL/hr over 120 Minutes Intravenous  Once 07/25/16 0835 07/25/16 1157   07/14/16 0900  cefTRIAXone (ROCEPHIN) 2 g in dextrose 5 % 50 mL IVPB     2 g 100 mL/hr over 30 Minutes Intravenous Every 24 hours 07/14/16 0814 07/21/16 0927   07/11/16 1830  vancomycin (VANCOCIN) IVPB 1000 mg/200 mL premix  Status:  Discontinued     1,000 mg 200 mL/hr over 60 Minutes Intravenous Every 8 hours 07/11/16 0952 07/13/16 0820   07/11/16 1030  vancomycin (VANCOCIN) 2,000 mg in sodium chloride 0.9 % 500 mL IVPB     2,000 mg 250 mL/hr over 120 Minutes Intravenous  Once 07/11/16 0952 07/11/16 1400   07/11/16 0945  piperacillin-tazobactam (ZOSYN) IVPB 3.375 g  Status:  Discontinued     3.375 g 12.5 mL/hr over 240 Minutes Intravenous Every 8 hours 07/11/16 0938 07/14/16 0814   07/08/16 2200  ceFAZolin (ANCEF) IVPB 1 g/50 mL premix  Status:  Discontinued     1 g 100 mL/hr over 30 Minutes Intravenous  Every 8  hours 07/08/16 1415 07/11/16 0938   07/08/16 1300  ceFAZolin (ANCEF) IVPB 2g/100 mL premix     2 g 200 mL/hr over 30 Minutes Intravenous STAT 07/08/16 1252 07/08/16 1354      Best Practice/Protocols:  VTE Prophylaxis: Lovenox (prophylaxtic dose) and Mechanical GI Prophylaxis: Proton Pump Inhibitor Continous Sedation  Consults: Treatment Team:  Loura Halt Ditty, MD    Events:  Subjective:    Overnight Issues: Weaning sedation.  Not able to extubate yet  Objective:  Vital signs for last 24 hours: Temp:  [98 F (36.7 C)-100 F (37.8 C)] 98.4 F (36.9 C) (01/26 0800) Pulse Rate:  [58-100] 88 (01/26 0800) Resp:  [15-27] 20 (01/26 0800) BP: (124-154)/(72-102) 140/94 (01/26 0800) SpO2:  [91 %-100 %] 98 % (01/26 0800) FiO2 (%):  [30 %] 30 % (01/26 0730) Weight:  [117.4 kg (258 lb 13.1 oz)] 117.4 kg (258 lb 13.1 oz) (01/26 0400)  Hemodynamic parameters for last 24 hours:    Intake/Output from previous day: 01/25 0701 - 01/26 0700 In: 4337.9 [I.V.:959; Blood:985; NG/GT:1743.9] Out: 2176 [Urine:2175; Stool:1]  Intake/Output this shift: Total I/O In: 100.9 [I.V.:35.9; NG/GT:65] Out: -   Vent settings for last 24 hours: Vent Mode: PSV;CPAP FiO2 (%):  [30 %] 30 % Set Rate:  [16 bmp] 16 bmp Vt Set:  [650 mL] 650 mL PEEP:  [5 cmH20] 5 cmH20 Pressure Support:  [5 cmH20] 5 cmH20 Plateau Pressure:  [24 cmH20-26 cmH20] 25 cmH20  Physical Exam:  General: no respiratory distress and sedated currently Neuro: nonfocal exam and RASS -1 Resp: clear to auscultation bilaterally and slightly diminished on the left side. CVS: regular rate and rhythm, S1, S2 normal, no murmur, click, rub or gallop GI: soft, nontender, BS WNL, no r/g and much more decompressed and tolerating tube feedings well. Extremities: edema 1+ and Edema is improved  Results for orders placed or performed during the hospital encounter of 07/08/16 (from the past 24 hour(s))  Glucose, capillary     Status:  Abnormal   Collection Time: 07/27/16 12:30 PM  Result Value Ref Range   Glucose-Capillary 157 (H) 65 - 99 mg/dL   Comment 1 Notify RN    Comment 2 Document in Chart   CBC     Status: Abnormal   Collection Time: 07/27/16  2:00 PM  Result Value Ref Range   WBC 7.6 4.0 - 10.5 K/uL   RBC 2.94 (L) 4.22 - 5.81 MIL/uL   Hemoglobin 8.9 (L) 13.0 - 17.0 g/dL   HCT 16.1 (L) 09.6 - 04.5 %   MCV 95.6 78.0 - 100.0 fL   MCH 30.3 26.0 - 34.0 pg   MCHC 31.7 30.0 - 36.0 g/dL   RDW 40.9 (H) 81.1 - 91.4 %   Platelets 347 150 - 400 K/uL  Glucose, capillary     Status: Abnormal   Collection Time: 07/27/16  4:20 PM  Result Value Ref Range   Glucose-Capillary 127 (H) 65 - 99 mg/dL   Comment 1 Notify RN    Comment 2 Document in Chart   Glucose, capillary     Status: Abnormal   Collection Time: 07/27/16  8:12 PM  Result Value Ref Range   Glucose-Capillary 142 (H) 65 - 99 mg/dL   Comment 1 Notify RN    Comment 2 Document in Chart   Glucose, capillary     Status: Abnormal   Collection Time: 07/27/16 11:57 PM  Result Value Ref Range   Glucose-Capillary 199 (H)  65 - 99 mg/dL  Glucose, capillary     Status: Abnormal   Collection Time: 07/28/16  4:12 AM  Result Value Ref Range   Glucose-Capillary 145 (H) 65 - 99 mg/dL  CBC     Status: Abnormal   Collection Time: 07/28/16  5:00 AM  Result Value Ref Range   WBC 5.7 4.0 - 10.5 K/uL   RBC 2.90 (L) 4.22 - 5.81 MIL/uL   Hemoglobin 9.0 (L) 13.0 - 17.0 g/dL   HCT 16.1 (L) 09.6 - 04.5 %   MCV 94.8 78.0 - 100.0 fL   MCH 31.0 26.0 - 34.0 pg   MCHC 32.7 30.0 - 36.0 g/dL   RDW 40.9 (H) 81.1 - 91.4 %   Platelets 362 150 - 400 K/uL  Basic metabolic panel     Status: Abnormal   Collection Time: 07/28/16  5:00 AM  Result Value Ref Range   Sodium 138 135 - 145 mmol/L   Potassium 4.0 3.5 - 5.1 mmol/L   Chloride 102 101 - 111 mmol/L   CO2 29 22 - 32 mmol/L   Glucose, Bld 159 (H) 65 - 99 mg/dL   BUN 15 6 - 20 mg/dL   Creatinine, Ser 7.82 0.61 - 1.24 mg/dL    Calcium 8.7 (L) 8.9 - 10.3 mg/dL   GFR calc non Af Amer >60 >60 mL/min   GFR calc Af Amer >60 >60 mL/min   Anion gap 7 5 - 15  Glucose, capillary     Status: Abnormal   Collection Time: 07/28/16  7:34 AM  Result Value Ref Range   Glucose-Capillary 153 (H) 65 - 99 mg/dL     Assessment/Plan:   NEURO  Altered Mental Status:  agitation, delirium and sedation   Plan: Wean sedation as tolerated  PULM  Atelectasis/collapse (focal and left side)   Plan: CXR repeat.  Cultures are final  CARDIO  No problems currently   Plan: CPM  RENAL  Urine output is good   Plan: Will give some more Lasix to try to increase chances of extubation  GI  No specific issues   Plan: Continue tube feedings.  ID  Pneumonia (being treated)   Plan: CPM  HEME  Anemia acute blood loss anemia and Improved after two units of blood.)   Plan: CPM  ENDO Hyperglycemia (stress related)   Plan: Improving with coverage  Global Issues  Still not in a position to extubated yet.  Will repeat head CT because he has not had one in 11 days and I want to make sure that this has not changed in a detrimental way.  CXR just done and shows improvement since yesterday.  Will check ABG.    LOS: 20 days   Additional comments:I reviewed the patient's new clinical lab test results. cbcb/bmet and I reviewed the patients new imaging test results. cxr and ct  head pending  Critical Care Total Time*: 30 Minutes  Syona Wroblewski 07/28/2016  *Care during the described time interval was provided by me and/or other providers on the critical care team.  I have reviewed this patient's available data, including medical history, events of note, physical examination and test results as part of my evaluation.
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
## 2016-07-28 NOTE — Progress Notes (Signed)
Patient transported to CT and back to room 3M02 without complications.  °
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
## 2016-07-29 LAB — CBC
HEMATOCRIT: 32.6 % — AB (ref 39.0–52.0)
Hemoglobin: 10.4 g/dL — ABNORMAL LOW (ref 13.0–17.0)
MCH: 30.5 pg (ref 26.0–34.0)
MCHC: 31.9 g/dL (ref 30.0–36.0)
MCV: 95.6 fL (ref 78.0–100.0)
Platelets: 416 10*3/uL — ABNORMAL HIGH (ref 150–400)
RBC: 3.41 MIL/uL — AB (ref 4.22–5.81)
RDW: 15.2 % (ref 11.5–15.5)
WBC: 11.6 10*3/uL — AB (ref 4.0–10.5)

## 2016-07-29 LAB — GLUCOSE, CAPILLARY
GLUCOSE-CAPILLARY: 135 mg/dL — AB (ref 65–99)
GLUCOSE-CAPILLARY: 157 mg/dL — AB (ref 65–99)
Glucose-Capillary: 156 mg/dL — ABNORMAL HIGH (ref 65–99)
Glucose-Capillary: 166 mg/dL — ABNORMAL HIGH (ref 65–99)
Glucose-Capillary: 151 mg/dL — ABNORMAL HIGH (ref 65–99)
Glucose-Capillary: 165 mg/dL — ABNORMAL HIGH (ref 65–99)

## 2016-07-29 LAB — BASIC METABOLIC PANEL
ANION GAP: 10 (ref 5–15)
BUN: 17 mg/dL (ref 6–20)
CO2: 30 mmol/L (ref 22–32)
Calcium: 9.1 mg/dL (ref 8.9–10.3)
Chloride: 97 mmol/L — ABNORMAL LOW (ref 101–111)
Creatinine, Ser: 0.83 mg/dL (ref 0.61–1.24)
GFR calc Af Amer: >60 mL/min (ref >60)
GFR calc non Af Amer: >60 mL/min (ref >60)
Glucose, Bld: 143 mg/dL — ABNORMAL HIGH (ref 65–99)
POTASSIUM: 3.9 mmol/L (ref 3.5–5.1)
Sodium: 137 mmol/L (ref 135–145)

## 2016-07-29 LAB — CULTURE, BLOOD (ROUTINE X 2): CULTURE: NO GROWTH

## 2016-07-29 MED ORDER — FREE WATER
200.0000 mL | Freq: Three times a day (TID) | Status: DC
  Administered 2016-07-29 – 2016-08-09 (×35): 200 mL

## 2016-07-29 MED ORDER — ENOXAPARIN SODIUM 30 MG/0.3ML ~~LOC~~ SOLN
30.0000 mg | Freq: Two times a day (BID) | SUBCUTANEOUS | Status: DC
  Administered 2016-07-29 – 2016-08-18 (×41): 30 mg via SUBCUTANEOUS
  Filled 2016-07-29 (×41): qty 0.3

## 2016-07-29 MED ORDER — QUETIAPINE FUMARATE 100 MG PO TABS
200.0000 mg | ORAL_TABLET | Freq: Two times a day (BID) | ORAL | Status: DC
  Administered 2016-07-29 – 2016-08-15 (×35): 200 mg
  Filled 2016-07-29 (×7): qty 1
  Filled 2016-07-29 (×2): qty 2
  Filled 2016-07-29 (×3): qty 1
  Filled 2016-07-29: qty 2
  Filled 2016-07-29 (×9): qty 1
  Filled 2016-07-29: qty 2
  Filled 2016-07-29 (×9): qty 1
  Filled 2016-07-29: qty 2
  Filled 2016-07-29 (×4): qty 1

## 2016-07-29 MED ORDER — LOPERAMIDE HCL 1 MG/5ML PO LIQD
2.0000 mg | ORAL | Status: DC | PRN
  Administered 2016-07-29 (×2): 2 mg via ORAL
  Administered 2016-07-30: 1 mg via ORAL
  Administered 2016-07-30 – 2016-08-13 (×14): 2 mg via ORAL
  Filled 2016-07-29 (×18): qty 10

## 2016-07-29 MED ORDER — BETHANECHOL CHLORIDE 25 MG PO TABS
25.0000 mg | ORAL_TABLET | Freq: Four times a day (QID) | ORAL | Status: DC
  Administered 2016-07-29 – 2016-08-13 (×63): 25 mg via ORAL
  Filled 2016-07-29 (×66): qty 1

## 2016-07-29 MED ORDER — CLONAZEPAM 1 MG PO TABS
2.0000 mg | ORAL_TABLET | Freq: Two times a day (BID) | ORAL | Status: DC
  Administered 2016-07-29 – 2016-07-31 (×6): 2 mg
  Filled 2016-07-29 (×6): qty 2
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
## 2016-07-29 NOTE — Progress Notes (Signed)
RT note- Patient taken off wean, with increased RR and VE, Etab for ETT changed and ETT repositioned. Continue to monitor.

## 2016-07-30 LAB — CBC
HCT: 31.3 % — ABNORMAL LOW (ref 39.0–52.0)
HEMOGLOBIN: 10.2 g/dL — AB (ref 13.0–17.0)
MCH: 31.2 pg (ref 26.0–34.0)
MCHC: 32.6 g/dL (ref 30.0–36.0)
MCV: 95.7 fL (ref 78.0–100.0)
Platelets: 378 10*3/uL (ref 150–400)
RBC: 3.27 MIL/uL — ABNORMAL LOW (ref 4.22–5.81)
RDW: 15 % (ref 11.5–15.5)
WBC: 13.2 10*3/uL — ABNORMAL HIGH (ref 4.0–10.5)

## 2016-07-30 LAB — GLUCOSE, CAPILLARY
GLUCOSE-CAPILLARY: 175 mg/dL — AB (ref 65–99)
GLUCOSE-CAPILLARY: 136 mg/dL — AB (ref 65–99)
Glucose-Capillary: 119 mg/dL — ABNORMAL HIGH (ref 65–99)
Glucose-Capillary: 119 mg/dL — ABNORMAL HIGH (ref 65–99)
Glucose-Capillary: 141 mg/dL — ABNORMAL HIGH (ref 65–99)
Glucose-Capillary: 193 mg/dL — ABNORMAL HIGH (ref 65–99)

## 2016-07-30 NOTE — Progress Notes (Signed)
RT note- Placed back to full support due to increased work of breathing, moderate amount of secretions, sedation required.

## 2016-07-30 NOTE — Procedures (Deleted)
Extubation Procedure Note  Patient Details:   Name: [name] DOB: 02/15/1981 MRN: [id number]   Airway Documentation:     Evaluation  O2 sats: stable throughout Complications: No apparent complications Patient did tolerate procedure well. Bilateral Breath Sounds: Rhonchi   Yes  2l/min [place] Incentive spirometer instructed, 1750ml achieved.  [name], [name] 07/30/2016, 1:00 PM
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
## 2016-07-31 ENCOUNTER — Inpatient Hospital Stay (HOSPITAL_COMMUNITY): Payer: Medicaid Other

## 2016-07-31 ENCOUNTER — Encounter (HOSPITAL_COMMUNITY): Admission: EM | Disposition: A | Discharge status: Rehabilitation Facility | Payer: Self-pay | Source: Home / Self Care

## 2016-07-31 HISTORY — PX: PERCUTANEOUS TRACHEOSTOMY: SHX5288

## 2016-07-31 HISTORY — PX: PEG PLACEMENT: SHX5437

## 2016-07-31 LAB — CBC
HEMATOCRIT: 27.7 % — AB (ref 39.0–52.0)
HEMOGLOBIN: 8.9 g/dL — AB (ref 13.0–17.0)
MCH: 30.8 pg (ref 26.0–34.0)
MCHC: 32.1 g/dL (ref 30.0–36.0)
MCV: 95.8 fL (ref 78.0–100.0)
Platelets: 335 10*3/uL (ref 150–400)
RBC: 2.89 MIL/uL — AB (ref 4.22–5.81)
RDW: 14.8 % (ref 11.5–15.5)
WBC: 9.9 10*3/uL (ref 4.0–10.5)

## 2016-07-31 LAB — BASIC METABOLIC PANEL
Anion gap: 12 (ref 5–15)
BUN: 18 mg/dL (ref 6–20)
CHLORIDE: 99 mmol/L — AB (ref 101–111)
CO2: 26 mmol/L (ref 22–32)
Calcium: 9 mg/dL (ref 8.9–10.3)
Creatinine, Ser: 0.77 mg/dL (ref 0.61–1.24)
GFR calc non Af Amer: >60 mL/min (ref >60)
Glucose, Bld: 121 mg/dL — ABNORMAL HIGH (ref 65–99)
POTASSIUM: 4 mmol/L (ref 3.5–5.1)
Sodium: 137 mmol/L (ref 135–145)

## 2016-07-31 LAB — GLUCOSE, CAPILLARY
GLUCOSE-CAPILLARY: 90 mg/dL (ref 65–99)
Glucose-Capillary: 125 mg/dL — ABNORMAL HIGH (ref 65–99)
Glucose-Capillary: 120 mg/dL — ABNORMAL HIGH (ref 65–99)
Glucose-Capillary: 110 mg/dL — ABNORMAL HIGH (ref 65–99)
Glucose-Capillary: 145 mg/dL — ABNORMAL HIGH (ref 65–99)

## 2016-07-31 LAB — MAGNESIUM: Magnesium: 1.6 mg/dL — ABNORMAL LOW (ref 1.7–2.4)

## 2016-07-31 LAB — PHOSPHORUS: Phosphorus: 4.3 mg/dL (ref 2.5–4.6)

## 2016-07-31 SURGERY — CREATION, TRACHEOSTOMY, PERCUTANEOUS
Anesthesia: LOCAL | Site: Neck

## 2016-07-31 SURGERY — INSERTION, PEG TUBE
Anesthesia: Moderate Sedation

## 2016-07-31 MED ORDER — MIDAZOLAM HCL 2 MG/2ML IJ SOLN
INTRAMUSCULAR | Status: AC
  Administered 2016-07-31: 4 mg
  Filled 2016-07-31: qty 4

## 2016-07-31 MED ORDER — MIDAZOLAM HCL 2 MG/2ML IJ SOLN
5.0000 mg | INTRAMUSCULAR | Status: DC | PRN
  Administered 2016-07-31: 4 mg via INTRAVENOUS
  Administered 2016-08-02: 2 mg via INTRAVENOUS
  Filled 2016-07-31 (×4): qty 10

## 2016-07-31 MED ORDER — MIDAZOLAM HCL 2 MG/2ML IJ SOLN
INTRAMUSCULAR | Status: AC
  Administered 2016-07-31: 4 mg via INTRAVENOUS
  Filled 2016-07-31: qty 4

## 2016-07-31 MED ORDER — VITAL HIGH PROTEIN PO LIQD: 1000.0000 mL | ORAL | Status: DC

## 2016-07-31 MED ORDER — VECURONIUM BROMIDE 10 MG IV SOLR
INTRAVENOUS | Status: AC
  Administered 2016-07-31: 10 mg
  Filled 2016-07-31: qty 10

## 2016-07-31 MED ORDER — LIDOCAINE-EPINEPHRINE (PF) 1.5 %-1:200000 IJ SOLN
INTRAMUSCULAR | Status: DC | PRN
  Administered 2016-07-31: 4 mL

## 2016-07-31 MED ORDER — VECURONIUM BROMIDE 10 MG IV SOLR
10.0000 mg | Freq: Once | INTRAVENOUS | Status: AC
  Administered 2016-07-31: 10 mg via INTRAVENOUS
  Filled 2016-07-31: qty 10

## 2016-07-31 MED ORDER — 0.9 % SODIUM CHLORIDE (POUR BTL) OPTIME
TOPICAL | Status: DC | PRN
  Administered 2016-07-31: 1000 mL

## 2016-07-31 MED ORDER — PRO-STAT SUGAR FREE PO LIQD: 30.0000 mL | Freq: Two times a day (BID) | ORAL | Status: DC

## 2016-07-31 SURGICAL SUPPLY — 29 items
DRAPE PROXIMA HALF (DRAPES) ×8 IMPLANT
DRAPE UTILITY XL STRL (DRAPES) ×2 IMPLANT
ELECT CAUTERY BLADE 6.4 (BLADE) ×2 IMPLANT
ELECT REM PT RETURN 9FT ADLT (ELECTROSURGICAL) ×2
ELECTRODE REM PT RTRN 9FT ADLT (ELECTROSURGICAL) ×1 IMPLANT
GAUZE SPONGE 4X4 16PLY XRAY LF (GAUZE/BANDAGES/DRESSINGS) ×2 IMPLANT
GLOVE BIO SURGEON STRL SZ 6 (GLOVE) IMPLANT
GLOVE BIO SURGEON STRL SZ8 (GLOVE) ×2 IMPLANT
GLOVE BIOGEL PI IND STRL 6.5 (GLOVE) IMPLANT
GLOVE BIOGEL PI IND STRL 8 (GLOVE) ×1 IMPLANT
GLOVE BIOGEL PI INDICATOR 6.5 (GLOVE)
GLOVE BIOGEL PI INDICATOR 8 (GLOVE) ×1
GLOVE ECLIPSE 7.5 STRL STRAW (GLOVE) ×4 IMPLANT
GLOVE INDICATOR 7.0 STRL GRN (GLOVE) ×2 IMPLANT
GOWN STRL REUS W/ TWL LRG LVL3 (GOWN DISPOSABLE) ×3 IMPLANT
GOWN STRL REUS W/ TWL XL LVL3 (GOWN DISPOSABLE) IMPLANT
GOWN STRL REUS W/TWL LRG LVL3 (GOWN DISPOSABLE) ×3
GOWN STRL REUS W/TWL XL LVL3 (GOWN DISPOSABLE)
INTRODUCER TRACH BLUE RHINO 6F (TUBING) IMPLANT
INTRODUCER TRACH BLUE RHINO 8F (TUBING) ×2 IMPLANT
NS IRRIG 1000ML POUR BTL (IV SOLUTION) ×2 IMPLANT
PENCIL BUTTON HOLSTER BLD 10FT (ELECTRODE) ×2 IMPLANT
SPONGE DRAIN TRACH 4X4 STRL 2S (GAUZE/BANDAGES/DRESSINGS) ×2 IMPLANT
SPONGE INTESTINAL PEANUT (DISPOSABLE) ×2 IMPLANT
SUT SILK 2 0 SH CR/8 (SUTURE) ×2 IMPLANT
SUT VICRYL AB 3 0 TIES (SUTURE) ×2 IMPLANT
TOWEL OR 17X26 10 PK STRL BLUE (TOWEL DISPOSABLE) ×2 IMPLANT
TUBE CONNECTING 12X1/4 (SUCTIONS) ×2 IMPLANT
YANKAUER SUCT BULB TIP NO VENT (SUCTIONS) ×2 IMPLANT

## 2016-07-31 NOTE — Op Note (Signed)
OPERATIVE REPORT  DATE OF OPERATION: 07/31/2016  PATIENT:  [name]  36 y.o. 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  POST-OPERATIVE DIAGNOSIS: Same  INDICATION(S) FOR OPERATION:  Traumatic brain injury requiring long term nutritional and ventilatory support  FINDINGS:  Intense inflammation around the trachea and the thyroid gland  PROCEDURE:  Procedure(s): PERCUTANEOUS ENDOSCOPIC GASTROSTOMY (PEG) PLACEMENT PERCUTANEOUS TRACHEOSTOMY  SURGEON:  Surgeon(s): Jimmye NormanJames Rachael Zapanta, MD  ASSISTANOren Binet: Sherer, PA-C  ANESTHESIA:   local and IV sedation  COMPLICATIONS:  None  EBL: 30 ml  BLOOD ADMINISTERED: none  DRAINS: Urinary Catheter (Foley), Gastrostomy Tube and #8 Tracheostomy tube   SPECIMEN:  No Specimen  COUNTS CORRECT:  YES  PROCEDURE DETAILS: Procedure was performed at the bedside in the [facility] [place] intensive care unit.  The percutaneous endoscopic gastrostomy tube was performed initially however that will be documented in a separate application.  After the PEG tube in place, we prepped and draped the patient for the tracheostomy at the bedside. A proper timeout was performed identifying the patient and procedure to be performed.  We anesthetized the area transversely just above the sternal notch using a 25-gauge needle and lidocaine solution. We made a transverse incision using a #15 blade then used electrocautery to dissect down into the subcutaneous tissue. As we got closer to the trachea an intense amount of inflammation was noted around the deep trachea in the lower portion of the thyroid gland. We had to spend a considerable amount of time dissecting away the thyroid gland from the anterior wall of the trachea as we identified the second tracheal ring.  Once we got to the point we palpated the anterior tracheal wall as we pulled back the endotracheal tube proximal to where we wanted placed tracheostomy.  Was at that point that an angiocatheter was passed into the lumen of the trachea and then subsequently a green wire using the Seldinger technique. A short blue dilator followed by the serial blue rhino dilator were passed over the wire into the tracheotomy enlarge into the proper size. We then passed the 28 French introducer and dilator over the wire and the introducer and the #8 tracheostomy tube into the tracheotomy securing it in position and inflating the cuff. Excellent volume return was noted on the ventilator and we secured the tracheostomy tube in place with sutures and a drain sponge.  Post placement we used a bronchoscope to confirm position of the endotracheal tube and also to the tracheostomy. All counts were correct.  PATIENT DISPOSITION:  Remained in stable condition in the ICU on the ventilator   [name] 1/29/201812:11 PM
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
## 2016-07-31 NOTE — Progress Notes (Signed)
Follow up - Trauma and Critical Care  Patient Details:    [name] is an 36 y.o. male.  Lines/tubes : Airway 7.5 mm (Active)  Secured at (cm) 26 cm 07/31/2016  3:39 AM  Measured From Lips 07/31/2016  3:39 AM  Secured Location Center 07/31/2016  3:39 AM  Secured By [employer] 07/31/2016  3:39 AM  Tube Holder Repositioned Yes 07/31/2016  3:39 AM  Cuff Pressure (cm H2O) 28 cm H2O 07/28/2016 11:52 PM  Site Condition Dry 07/30/2016  4:12 PM     PICC Triple Lumen 07/09/16 PICC Right Brachial 44 cm 0 cm (Active)  Indication for Insertion or Continuance of Line Prolonged intravenous therapies 07/30/2016  8:00 PM  Exposed Catheter (cm) 0 cm 07/23/2016  8:00 PM  Site Assessment Clean;Dry;Intact 07/30/2016  8:00 PM  Lumen #1 Status Infusing 07/30/2016  8:00 PM  Lumen #2 Status Infusing 07/30/2016  8:00 PM  Lumen #3 Status Infusing 07/30/2016  8:00 PM  Dressing Type Transparent;Occlusive 07/30/2016  8:00 PM  Dressing Status Clean;Dry;Antimicrobial disc in place;Intact 07/30/2016  8:00 PM  Line Care Connections checked and tightened 07/30/2016  8:00 PM  Line Adjustment (NICU/IV Team Only) No 07/24/2016  8:00 AM  Dressing Intervention Dressing changed;Antimicrobial disc changed 07/30/2016  2:01 PM  Dressing Change Due 08/06/16 07/30/2016  2:01 PM     NG/OG Tube Orogastric 18 Fr. Center mouth Xray (Active)  External Length of Tube (cm) - (if applicable) 56 cm 07/23/2016  8:00 PM  Site Assessment Clean;Dry;Intact 07/30/2016  8:00 PM  Ongoing Placement Verification No change in cm markings or external length of tube from initial placement;No change in respiratory status 07/30/2016  8:00 PM  Status Infusing tube feed 07/30/2016  8:00 PM  Drainage Appearance Bile 07/23/2016  8:00 PM  Intake (mL) 100 mL 07/24/2016  1:00 PM  Output (mL) 350 mL 07/28/2016  8:00 PM     Urethral Catheter Marlana Latus RN Straight-tip 16 Fr. (Active)  Indication for Insertion or Continuance of Catheter Acute urinary retention  07/30/2016  8:00 PM  Site Assessment Clean;Intact 07/30/2016  8:00 PM  Catheter Maintenance Bag below level of bladder;Catheter secured;Drainage bag/tubing not touching floor;Insertion date on drainage bag;No dependent loops;Seal intact;Bag emptied prior to transport 07/30/2016  8:00 PM  Collection Container Standard drainage bag 07/30/2016  8:00 PM  Securement Method Securing device (Describe) 07/30/2016  8:00 PM  Urinary Catheter Interventions Unclamped 07/30/2016  8:00 PM  Output (mL) 150 mL 07/31/2016  6:00 AM    Microbiology/Sepsis markers: Results for orders placed or performed during the hospital encounter of 07/08/16  Culture, Urine     Status: None   Collection Time: 07/11/16 10:20 AM  Result Value Ref Range Status   Specimen Description URINE, CATHETERIZED  Final   Special Requests NONE  Final   Culture NO GROWTH  Final   Report Status 07/12/2016 FINAL  Final  Culture, blood (routine x 2)     Status: None   Collection Time: 07/11/16 11:06 AM  Result Value Ref Range Status   Specimen Description BLOOD BLOOD LEFT FOREARM  Final   Special Requests IN PEDIATRIC BOTTLE 3CC  Final   Culture NO GROWTH 5 DAYS  Final   Report Status 07/16/2016 FINAL  Final  Culture, blood (routine x 2)     Status: None   Collection Time: 07/11/16 11:06 AM  Result Value Ref Range Status   Specimen Description BLOOD BLOOD LEFT FOREARM  Final   Special Requests IN PEDIATRIC  BOTTLE 2.5CC  Final   Culture NO GROWTH 5 DAYS  Final   Report Status 07/16/2016 FINAL  Final  Culture, respiratory (NON-Expectorated)     Status: None   Collection Time: 07/11/16 11:25 AM  Result Value Ref Range Status   Specimen Description TRACHEAL ASPIRATE  Final   Special Requests NONE  Final   Gram Stain   Final    MODERATE WBC PRESENT, PREDOMINANTLY MONONUCLEAR RARE GRAM POSITIVE COCCI IN PAIRS    Culture   Final    ABUNDANT HAEMOPHILUS INFLUENZAE BETA LACTAMASE NEGATIVE    Report Status 07/13/2016 FINAL  Final  MRSA  PCR Screening     Status: None   Collection Time: 07/16/16  1:58 AM  Result Value Ref Range Status   MRSA by PCR NEGATIVE NEGATIVE Final    Comment:        The GeneXpert MRSA Assay (FDA approved for NASAL specimens only), is one component of a comprehensive MRSA colonization surveillance program. It is not intended to diagnose MRSA infection nor to guide or monitor treatment for MRSA infections.   Culture, respiratory (NON-Expectorated)     Status: None   Collection Time: 07/23/16  9:11 AM  Result Value Ref Range Status   Specimen Description TRACHEAL ASPIRATE  Final   Special Requests Normal  Final   Gram Stain   Final    ABUNDANT WBC PRESENT, PREDOMINANTLY PMN ABUNDANT GRAM POSITIVE COCCI IN PAIRS MODERATE GRAM POSITIVE COCCI IN CLUSTERS MODERATE GRAM POSITIVE RODS    Culture MODERATE STAPHYLOCOCCUS AUREUS  Final   Report Status 07/25/2016 FINAL  Final   Organism ID, Bacteria STAPHYLOCOCCUS AUREUS  Final      Susceptibility   Staphylococcus aureus - MIC*    CIPROFLOXACIN <=0.5 SENSITIVE Sensitive     ERYTHROMYCIN 0.5 SENSITIVE Sensitive     GENTAMICIN <=0.5 SENSITIVE Sensitive     OXACILLIN 0.5 SENSITIVE Sensitive     TETRACYCLINE <=1 SENSITIVE Sensitive     VANCOMYCIN <=0.5 SENSITIVE Sensitive     TRIMETH/SULFA <=10 SENSITIVE Sensitive     CLINDAMYCIN <=0.25 SENSITIVE Sensitive     RIFAMPIN <=0.5 SENSITIVE Sensitive     Inducible Clindamycin NEGATIVE Sensitive     * MODERATE STAPHYLOCOCCUS AUREUS  Culture, Urine     Status: Abnormal   Collection Time: 07/24/16 12:15 PM  Result Value Ref Range Status   Specimen Description URINE, RANDOM  Final   Special Requests Normal  Final   Culture <10,000 COLONIES/mL INSIGNIFICANT GROWTH (A)  Final   Report Status 07/25/2016 FINAL  Final  Culture, blood (routine x 2)     Status: None   Collection Time: 07/24/16  1:50 PM  Result Value Ref Range Status   Specimen Description BLOOD BLOOD LEFT ARM  Final   Special Requests  BOTTLES DRAWN AEROBIC AND ANAEROBIC 10CC  Final   Culture NO GROWTH 5 DAYS  Final   Report Status 07/29/2016 FINAL  Final  Culture, blood (routine x 2)     Status: Abnormal   Collection Time: 07/24/16  2:00 PM  Result Value Ref Range Status   Specimen Description BLOOD LEFT ANTECUBITAL  Final   Special Requests BOTTLES DRAWN AEROBIC AND ANAEROBIC 10CC  Final   Culture  Setup Time   Final    GRAM POSITIVE COCCI IN CLUSTERS IN BOTH AEROBIC AND ANAEROBIC BOTTLES CRITICAL RESULT CALLED TO, READ BACK BY AND VERIFIED WITHLelon Huh [facility] 1530 07/25/16 M WILSON    Culture (A)  Final  STAPHYLOCOCCUS SPECIES (COAGULASE NEGATIVE) THE SIGNIFICANCE OF ISOLATING THIS ORGANISM FROM A SINGLE SET OF BLOOD CULTURES WHEN MULTIPLE SETS ARE DRAWN IS UNCERTAIN. PLEASE NOTIFY THE MICROBIOLOGY DEPARTMENT WITHIN ONE WEEK IF SPECIATION AND SENSITIVITIES ARE REQUIRED.    Report Status 07/27/2016 FINAL  Final  Blood Culture ID Panel (Reflexed)     Status: Abnormal   Collection Time: 07/24/16  2:00 PM  Result Value Ref Range Status   Enterococcus species NOT DETECTED NOT DETECTED Final   Listeria monocytogenes NOT DETECTED NOT DETECTED Final   Staphylococcus species DETECTED (A) NOT DETECTED Final    Comment: CRITICAL RESULT CALLED TO, READ BACK BY AND VERIFIED WITH: LHosie Poisson.D. 15:30 07/25/16 (wilsonm)    Staphylococcus aureus NOT DETECTED NOT DETECTED Final   Methicillin resistance DETECTED (A) NOT DETECTED Final    Comment: CRITICAL RESULT CALLED TO, READ BACK BY AND VERIFIED WITH: LHosie Poisson.D. 15:30 07/25/16 (wilsonm)    Streptococcus species NOT DETECTED NOT DETECTED Final   Streptococcus agalactiae NOT DETECTED NOT DETECTED Final   Streptococcus pneumoniae NOT DETECTED NOT DETECTED Final   Streptococcus pyogenes NOT DETECTED NOT DETECTED Final   Acinetobacter baumannii NOT DETECTED NOT DETECTED Final   Enterobacteriaceae species NOT DETECTED NOT DETECTED Final   Enterobacter cloacae  complex NOT DETECTED NOT DETECTED Final   Escherichia coli NOT DETECTED NOT DETECTED Final   Klebsiella oxytoca NOT DETECTED NOT DETECTED Final   Klebsiella pneumoniae NOT DETECTED NOT DETECTED Final   Proteus species NOT DETECTED NOT DETECTED Final   Serratia marcescens NOT DETECTED NOT DETECTED Final   Haemophilus influenzae NOT DETECTED NOT DETECTED Final   Neisseria meningitidis NOT DETECTED NOT DETECTED Final   Pseudomonas aeruginosa NOT DETECTED NOT DETECTED Final   Candida albicans NOT DETECTED NOT DETECTED Final   Candida glabrata NOT DETECTED NOT DETECTED Final   Candida krusei NOT DETECTED NOT DETECTED Final   Candida parapsilosis NOT DETECTED NOT DETECTED Final   Candida tropicalis NOT DETECTED NOT DETECTED Final    Anti-infectives:  Anti-infectives    Start     Dose/Rate Route Frequency Ordered Stop   07/26/16 1200  cefTRIAXone (ROCEPHIN) 2 g in dextrose 5 % 50 mL IVPB     2 g 100 mL/hr over 30 Minutes Intravenous Every 24 hours 07/26/16 1159 08/02/16 1159   07/25/16 1800  vancomycin (VANCOCIN) IVPB 1000 mg/200 mL premix  Status:  Discontinued     1,000 mg 200 mL/hr over 60 Minutes Intravenous Every 8 hours 07/25/16 0835 07/26/16 1159   07/25/16 0900  piperacillin-tazobactam (ZOSYN) IVPB 3.375 g  Status:  Discontinued     3.375 g 12.5 mL/hr over 240 Minutes Intravenous Every 8 hours 07/25/16 0820 07/26/16 1159   07/25/16 0900  vancomycin (VANCOCIN) 2,000 mg in sodium chloride 0.9 % 500 mL IVPB     2,000 mg 250 mL/hr over 120 Minutes Intravenous  Once 07/25/16 0835 07/25/16 1157   07/14/16 0900  cefTRIAXone (ROCEPHIN) 2 g in dextrose 5 % 50 mL IVPB     2 g 100 mL/hr over 30 Minutes Intravenous Every 24 hours 07/14/16 0814 07/21/16 0927   07/11/16 1830  vancomycin (VANCOCIN) IVPB 1000 mg/200 mL premix  Status:  Discontinued     1,000 mg 200 mL/hr over 60 Minutes Intravenous Every 8 hours 07/11/16 0952 07/13/16 0820   07/11/16 1030  vancomycin (VANCOCIN) 2,000 mg in  sodium chloride 0.9 % 500 mL IVPB     2,000 mg 250 mL/hr over 120 Minutes Intravenous  Once 07/11/16 0952 07/11/16 1400   07/11/16 0945  piperacillin-tazobactam (ZOSYN) IVPB 3.375 g  Status:  Discontinued     3.375 g 12.5 mL/hr over 240 Minutes Intravenous Every 8 hours 07/11/16 0938 07/14/16 0814   07/08/16 2200  ceFAZolin (ANCEF) IVPB 1 g/50 mL premix  Status:  Discontinued     1 g 100 mL/hr over 30 Minutes Intravenous Every 8 hours 07/08/16 1415 07/11/16 0938   07/08/16 1300  ceFAZolin (ANCEF) IVPB 2g/100 mL premix     2 g 200 mL/hr over 30 Minutes Intravenous STAT 07/08/16 1252 07/08/16 1354      Best Practice/Protocols:  VTE Prophylaxis: Lovenox (prophylaxtic dose) and Mechanical GI Prophylaxis: Proton Pump Inhibitor Continous Sedation  Consults: Treatment Team:  Loura HaltBenjamin Jared Ditty, MD    Events:  Subjective:    Overnight Issues: Not requiring a lot of sedation currently  Objective:  Vital signs for last 24 hours: Temp:  [99 F (37.2 C)-100.1 F (37.8 C)] 99.4 F (37.4 C) (01/29 0400) Pulse Rate:  [76-109] 76 (01/29 0600) Resp:  [15-36] 16 (01/29 0600) BP: (96-150)/(54-98) 96/63 (01/29 0600) SpO2:  [95 %-100 %] 100 % (01/29 0817) FiO2 (%):  [30 %] 30 % (01/29 0817) Weight:  [107.5 kg (236 lb 15.9 oz)] 107.5 kg (236 lb 15.9 oz) (01/29 0500)  Hemodynamic parameters for last 24 hours:    Intake/Output from previous day: 01/28 0701 - 01/29 0700 In: 1500.4 [I.V.:500.4; NG/GT:950; IV Piggyback:50] Out: 1800 [Urine:1800]  Intake/Output this shift: No intake/output data recorded.  Vent settings for last 24 hours: Vent Mode: PRVC FiO2 (%):  [30 %] 30 % Set Rate:  [16 bmp] 16 bmp Vt Set:  [650 mL] 650 mL PEEP:  [5 cmH20] 5 cmH20 Pressure Support:  [10 cmH20] 10 cmH20 Plateau Pressure:  [17 cmH20-18 cmH20] 17 cmH20  Physical Exam:  General: no respiratory distress and awakens easily Neuro: nonfocal exam, RASS 0 and RASS -1 Resp: wheezes LUL CVS: regular  rate and rhythm, S1, S2 normal, no murmur, click, rub or gallop GI: soft, nontender, BS WNL, no r/g and tube feedings off Extremities: edema 1+ and edema much improved  Results for orders placed or performed during the hospital encounter of 07/08/16 (from the past 24 hour(s))  Glucose, capillary     Status: Abnormal   Collection Time: 07/30/16 12:07 PM  Result Value Ref Range   Glucose-Capillary 136 (H) 65 - 99 mg/dL   Comment 1 Notify RN    Comment 2 Document in Chart   Glucose, capillary     Status: Abnormal   Collection Time: 07/30/16  4:07 PM  Result Value Ref Range   Glucose-Capillary 193 (H) 65 - 99 mg/dL   Comment 1 Notify RN    Comment 2 Document in Chart   Glucose, capillary     Status: Abnormal   Collection Time: 07/30/16  7:24 PM  Result Value Ref Range   Glucose-Capillary 119 (H) 65 - 99 mg/dL  Glucose, capillary     Status: Abnormal   Collection Time: 07/30/16 11:38 PM  Result Value Ref Range   Glucose-Capillary 119 (H) 65 - 99 mg/dL  Glucose, capillary     Status: Abnormal   Collection Time: 07/31/16  3:21 AM  Result Value Ref Range   Glucose-Capillary 125 (H) 65 - 99 mg/dL  CBC     Status: Abnormal   Collection Time: 07/31/16  5:35 AM  Result Value Ref Range   WBC 9.9 4.0 - 10.5 K/uL  RBC 2.89 (L) 4.22 - 5.81 MIL/uL   Hemoglobin 8.9 (L) 13.0 - 17.0 g/dL   HCT 11.9 (L) 14.7 - 82.9 %   MCV 95.8 78.0 - 100.0 fL   MCH 30.8 26.0 - 34.0 pg   MCHC 32.1 30.0 - 36.0 g/dL   RDW 56.2 13.0 - 86.5 %   Platelets 335 150 - 400 K/uL  Basic metabolic panel     Status: Abnormal   Collection Time: 07/31/16  5:35 AM  Result Value Ref Range   Sodium 137 135 - 145 mmol/L   Potassium 4.0 3.5 - 5.1 mmol/L   Chloride 99 (L) 101 - 111 mmol/L   CO2 26 22 - 32 mmol/L   Glucose, Bld 121 (H) 65 - 99 mg/dL   BUN 18 6 - 20 mg/dL   Creatinine, Ser 7.84 0.61 - 1.24 mg/dL   Calcium 9.0 8.9 - 69.6 mg/dL   GFR calc non Af Amer >60 >60 mL/min   GFR calc Af Amer >60 >60 mL/min   Anion  gap 12 5 - 15  Glucose, capillary     Status: Abnormal   Collection Time: 07/31/16  8:26 AM  Result Value Ref Range   Glucose-Capillary 120 (H) 65 - 99 mg/dL   Comment 1 Notify RN    Comment 2 Document in Chart      Assessment/Plan:   NEURO  Altered Mental Status:  sedation   Plan: wean sedation once tracheostomy is in place  PULM  Pneumonia: hospital acquired (not ventilator-associated) Staph   Plan: Being treated appropriately.  CXR is clear  CARDIO  No specific issues   Plan: CPM  RENAL  Negative on fluids.   Plan: Renal function is good.  No Lasix  GI  No specific issues   Plan: trach and PEG today  ID  Pneumonia (hospital acquired (not ventilator-associated) Staph)   Plan: See comments under pulmonary system  HEME  Anemia acute blood loss anemia and anemia of critical illness)   Plan: Hemoglobin dropped a bit since yesterday.  Will recheck tomorrow  ENDO No specific issues   Plan: CPM  Global Issues  For bedside trach and PEg today.  No identified C-spine bony injuries seen on CT.  Can use roll for trach.    LOS: 23 days   Additional comments:I reviewed the patient's new clinical lab test results. cbc/bmet and I reviewed the patients new imaging test results. cxr  Critical Care Total Time*: 30 Minutes  Abiel Antrim 07/31/2016  *Care during the described time interval was provided by me and/or other providers on the critical care team.  I have reviewed this patient's available data, including medical history, events of note, physical examination and test results as part of my evaluation.
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
## 2016-08-01 ENCOUNTER — Inpatient Hospital Stay (HOSPITAL_COMMUNITY): Payer: Medicaid Other

## 2016-08-01 ENCOUNTER — Encounter (HOSPITAL_COMMUNITY): Payer: Self-pay | Admitting: General Surgery

## 2016-08-01 LAB — BASIC METABOLIC PANEL
ANION GAP: 8 (ref 5–15)
BUN: 18 mg/dL (ref 6–20)
CALCIUM: 8.8 mg/dL — AB (ref 8.9–10.3)
CO2: 26 mmol/L (ref 22–32)
CREATININE: 0.81 mg/dL (ref 0.61–1.24)
Chloride: 102 mmol/L (ref 101–111)
GFR calc Af Amer: >60 mL/min (ref >60)
GLUCOSE: 196 mg/dL — AB (ref 65–99)
Potassium: 4.4 mmol/L (ref 3.5–5.1)
Sodium: 136 mmol/L (ref 135–145)

## 2016-08-01 LAB — CBC
HEMATOCRIT: 30.9 % — AB (ref 39.0–52.0)
Hemoglobin: 9.9 g/dL — ABNORMAL LOW (ref 13.0–17.0)
MCH: 30.7 pg (ref 26.0–34.0)
MCHC: 32 g/dL (ref 30.0–36.0)
MCV: 96 fL (ref 78.0–100.0)
PLATELETS: 360 10*3/uL (ref 150–400)
RBC: 3.22 MIL/uL — ABNORMAL LOW (ref 4.22–5.81)
RDW: 15.1 % (ref 11.5–15.5)
WBC: 20.2 10*3/uL — ABNORMAL HIGH (ref 4.0–10.5)

## 2016-08-01 LAB — GLUCOSE, CAPILLARY
GLUCOSE-CAPILLARY: 115 mg/dL — AB (ref 65–99)
GLUCOSE-CAPILLARY: 187 mg/dL — AB (ref 65–99)
Glucose-Capillary: 199 mg/dL — ABNORMAL HIGH (ref 65–99)
Glucose-Capillary: 226 mg/dL — ABNORMAL HIGH (ref 65–99)
Glucose-Capillary: 270 mg/dL — ABNORMAL HIGH (ref 65–99)
Glucose-Capillary: 270 mg/dL — ABNORMAL HIGH (ref 65–99)
Glucose-Capillary: 287 mg/dL — ABNORMAL HIGH (ref 65–99)

## 2016-08-01 LAB — MAGNESIUM: Magnesium: 1.7 mg/dL (ref 1.7–2.4)

## 2016-08-01 LAB — PHOSPHORUS: Phosphorus: 4.5 mg/dL (ref 2.5–4.6)

## 2016-08-01 MED ORDER — ALBUTEROL SULFATE (2.5 MG/3ML) 0.083% IN NEBU
INHALATION_SOLUTION | RESPIRATORY_TRACT | Status: AC
  Administered 2016-08-01: 2.5 mg
  Filled 2016-08-01: qty 3

## 2016-08-01 MED ORDER — IPRATROPIUM BROMIDE 0.02 % IN SOLN
RESPIRATORY_TRACT | Status: AC
  Filled 2016-08-01: qty 2.5

## 2016-08-01 MED ORDER — GUAIFENESIN 100 MG/5ML PO SOLN
5.0000 mL | Freq: Four times a day (QID) | ORAL | Status: DC
  Administered 2016-08-01 – 2016-08-14 (×51): 100 mg via ORAL
  Filled 2016-08-01 (×6): qty 5
  Filled 2016-08-01: qty 15
  Filled 2016-08-01 (×43): qty 5

## 2016-08-01 MED ORDER — CLONAZEPAM 1 MG PO TABS
1.0000 mg | ORAL_TABLET | Freq: Three times a day (TID) | ORAL | Status: DC
  Administered 2016-08-01 – 2016-08-15 (×42): 1 mg
  Filled 2016-08-01 (×43): qty 1

## 2016-08-01 MED ORDER — LEVALBUTEROL HCL 0.63 MG/3ML IN NEBU
INHALATION_SOLUTION | RESPIRATORY_TRACT | Status: AC
  Filled 2016-08-01: qty 3

## 2016-08-01 MED ORDER — LEVALBUTEROL HCL 0.63 MG/3ML IN NEBU
0.6300 mg | INHALATION_SOLUTION | Freq: Four times a day (QID) | RESPIRATORY_TRACT | Status: DC
  Administered 2016-08-01 – 2016-08-07 (×26): 0.63 mg via RESPIRATORY_TRACT
  Filled 2016-08-01 (×25): qty 3

## 2016-08-01 MED ORDER — PIPERACILLIN-TAZOBACTAM 3.375 G IVPB
3.3750 g | Freq: Three times a day (TID) | INTRAVENOUS | Status: AC
  Administered 2016-08-01 – 2016-08-09 (×24): 3.375 g via INTRAVENOUS
  Filled 2016-08-01 (×24): qty 50

## 2016-08-01 MED ORDER — VANCOMYCIN HCL IN DEXTROSE 1-5 GM/200ML-% IV SOLN
1000.0000 mg | Freq: Three times a day (TID) | INTRAVENOUS | Status: DC
  Administered 2016-08-01 – 2016-08-03 (×5): 1000 mg via INTRAVENOUS
  Filled 2016-08-01 (×6): qty 200

## 2016-08-01 MED ORDER — VANCOMYCIN HCL 10 G IV SOLR
1750.0000 mg | Freq: Once | INTRAVENOUS | Status: AC
  Administered 2016-08-01: 1750 mg via INTRAVENOUS
  Filled 2016-08-01: qty 1750

## 2016-08-01 MED ORDER — ACETYLCYSTEINE 20 % IN SOLN
600.0000 mg | Freq: Three times a day (TID) | RESPIRATORY_TRACT | Status: AC
  Administered 2016-08-01 – 2016-08-02 (×3): 600 mg via RESPIRATORY_TRACT
  Filled 2016-08-01 (×4): qty 4

## 2016-08-01 MED ORDER — INSULIN GLARGINE 100 UNIT/ML ~~LOC~~ SOLN
10.0000 [IU] | Freq: Two times a day (BID) | SUBCUTANEOUS | Status: DC
  Administered 2016-08-01 – 2016-08-18 (×33): 10 [IU] via SUBCUTANEOUS
  Filled 2016-08-01 (×37): qty 0.1

## 2016-08-01 MED ORDER — IPRATROPIUM BROMIDE 0.02 % IN SOLN
0.5000 mg | Freq: Four times a day (QID) | RESPIRATORY_TRACT | Status: DC
  Administered 2016-08-01 – 2016-08-07 (×26): 0.5 mg via RESPIRATORY_TRACT
  Filled 2016-08-01 (×25): qty 2.5

## 2016-08-01 NOTE — Progress Notes (Signed)
RT note:  Attempted to place patient on 35% trach collar at 0730.  Patient sats maintained at 100% however patient began to have a respiratory rate in the 50s and heart rate began to increase.  Placed patient back on ventilator on CPAP/PSV 15/5 however patient lasted 20 minutes on that mode.  Switched to full support.  RN also gave some medication to help patient.  Will continue to monitor.
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
## 2016-08-01 NOTE — Progress Notes (Signed)
Pharmacy Antibiotic Note  [name] is a 36 y.o. male admitted on 07/08/2016 with pneumonia.  Pharmacy has been consulted for vancomycin dosing.  Will load vancomycin 1750mg  IV once.  Plan: Vancomycin 1g IV every 8 hours.  Goal trough 15-20 mcg/mL. Zosyn 3.375g IV q8h (4 hour infusion).  Monitor culture data, renal function and clinical course VT at SS prn   Height: 6\' 2"  (188 cm) Weight: 229 lb 4.5 oz (104 kg) IBW/kg (Calculated) : 82.2  Temp (24hrs), Avg:101.9 F (38.8 C), Min:98.6 F (37 C), Max:104.1 F (40.1 C)   Recent Labs Lab 07/27/16 0500  07/28/16 0500 07/29/16 0643 07/30/16 0500 07/31/16 0535 08/01/16 0600  WBC 8.0  < > 5.7 11.6* 13.2* 9.9 20.2*  CREATININE 0.63  --  0.65 0.83  --  0.77 0.81  < > = values in this interval not displayed.  Estimated Creatinine Clearance: 166.8 mL/min (by C-G formula based on SCr of 0.81 mg/dL).    No Known Allergies  Antimicrobials this admission: Vanc 1/9 >>1/11;  1/23; 1/30>> Zosyn 1/9 >> 1/12;  1/23; 1/30>> CTX 1/12>> 1/19; 1/24 >>1/30   Arlean Hoppingorey M. Newman PiesBall, PharmD, BCPS Clinical Pharmacist [phone] 08/01/2016 12:53 PM

## 2016-08-01 NOTE — Progress Notes (Signed)
Patient ID: [name], male   DOB: [birth year], 36 y.o.   MRN: [id number] I called his mother and updated her on his current status. Violeta GelinasBurke Michaeljohn Biss, MD, MPH, FACS Trauma: [phone] General Surgery: [phone]

## 2016-08-01 NOTE — Op Note (Signed)
[facility] Patient Name: [name] Procedure Date : 07/31/2016 MRN: [id number] Attending MD: Kathrin Ruddy, MD Date of Birth: December 16, 1980 CSN: [id number] Age: 36 Admit Type: Inpatient Procedure:                Upper GI endoscopy Indications:              Place PEG because patient is unable to eat, Place                            PEG due to neurological disorder causing impaired                            swallowing Providers:                Jimmye Norman, MD, Lorenda Ishihara, Technician Referring MD:              Medicines:                Fentanyl 500 micrograms IV, Midazolam 16 mg IV,                            norcuron 20 mg IV Complications:            No immediate complications. Estimated Blood Loss:     Estimated blood loss was minimal. Procedure:                Pre-Anesthesia Assessment:                           - Prior to the procedure, a History and Physical                            was performed, and patient medications and                            allergies were reviewed. The patient is unable to                            give consent secondary to the patient's altered                            mental status. The risks and benefits of the                            procedure and the sedation options and risks were                            discussed with the patient's mother. All questions                            were answered and informed consent was obtained.                            Patient identification and proposed procedure were  verified by the nurse Patient's Room. Mental Status                            Examination: alert but confused. Airway                            Examination: normal oropharyngeal airway and neck                            mobility. Respiratory Examination: clear to                            auscultation. CV Examination: normal. ASA Grade                            Assessment: II - A  patient with mild systemic                            disease. After reviewing the risks and benefits,                            the patient was deemed in satisfactory condition to                            undergo the procedure. The anesthesia plan was to                            use deep sedation / analgesia. Immediately prior to                            administration of medications, the patient was                            re-assessed for adequacy to receive sedatives. The                            heart rate, respiratory rate, oxygen saturations,                            blood pressure, adequacy of pulmonary ventilation,                            and response to care were monitored throughout the                            procedure. The physical status of the patient was                            re-assessed after the procedure.                           - The risks and benefits of the procedure and the  sedation options and risks were discussed with the                            patient. All questions were answered and informed                            consent was obtained.                           After obtaining informed consent, the endoscope was                            passed under direct vision. Throughout the                            procedure, the patient's blood pressure, pulse, and                            oxygen saturations were monitored continuously. The                            EG-[id number] ([id number]) scope was introduced through the                            mouth, and advanced to the second part of duodenum.                            The upper GI endoscopy was accomplished without                            difficulty. Scope In: Scope Out: Findings:      The entire examined stomach was normal. The patient was placed in the       supine position for PEG placement. The stomach was insufflated to appose       gastric and  abdominal walls. A site was located [location] [location       method]. The abdominal wall was marked and prepped in a sterile manner.       [local anesthetic]. The trocar needle was introduced through the       abdominal wall and into the stomach [trocar visualization]. A snare was       introduced through the endoscope and opened in the gastric lumen. The       guide wire was passed through the trocar and into the open snare. The       snare was closed around the guide wire. The endoscope and snare were       removed, pulling the wire out through the mouth. A skin incision was       made at the site of needle insertion. The [ext removable] [g-tube type]       gastrostomy tube was lubricated. The G-tube was tied to the guide wire       and pulled through the mouth and into the stomach. The trocar needle was       removed, and the gastrostomy tube was pulled out from the stomach       through the skin. The external bumper was  attached to the gastrostomy       tube, and the tube was cut to remove the guide wire. The final position       of the gastrostomy tube was [confirm position] skin marking noted to be       [cm] at the external bumper. The final tension and compression of the       abdominal wall by the PEG tube and external bumper were checked [PEG       Abdominal Wall Tension]. The feeding tube was capped, and the tube site       cleaned and dressed. Impression:               - Normal stomach.                           - A PEG placement was successfully completed.                           - No specimens collected.                           - Normal examination.                           - Normal examination.                           - Normal examination.                           - Normal examination. Recommendation:           - Please follow the post-PEG recommendations                            including: use PEG today after checked by physician                            and  start using PEG today. Procedure Code(s):        --- Professional ---                           [phone], Esophagogastroduodenoscopy, flexible,                            transoral; with directed placement of percutaneous                            gastrostomy tube Diagnosis Code(s):        --- Professional ---                           R63.3, Feeding difficulties                           Z43.1, Encounter for attention to gastrostomy                           R29.818, Other symptoms and signs involving the  nervous system                           R13.10, Dysphagia, unspecified CPT copyright 2016 [employer]. All rights reserved. The codes documented in this report are preliminary and upon coder review may  be revised to meet current compliance requirements. Kathrin Ruddy, MD Jimmye Norman III, MD 07/31/2016 12:58:09 PM This report has been signed electronically. Number of Addenda: 0

## 2016-08-02 ENCOUNTER — Encounter (HOSPITAL_COMMUNITY): Payer: Self-pay | Admitting: Anesthesiology

## 2016-08-02 ENCOUNTER — Inpatient Hospital Stay (HOSPITAL_COMMUNITY): Payer: Medicaid Other | Admitting: Anesthesiology

## 2016-08-02 ENCOUNTER — Inpatient Hospital Stay (HOSPITAL_COMMUNITY): Payer: Medicaid Other

## 2016-08-02 ENCOUNTER — Encounter (HOSPITAL_COMMUNITY): Admission: EM | Disposition: A | Discharge status: Rehabilitation Facility | Payer: Self-pay | Source: Home / Self Care

## 2016-08-02 HISTORY — PX: LAPAROTOMY: SHX154

## 2016-08-02 LAB — BASIC METABOLIC PANEL
ANION GAP: 7 (ref 5–15)
BUN: 16 mg/dL (ref 6–20)
CALCIUM: 8.6 mg/dL — AB (ref 8.9–10.3)
CHLORIDE: 102 mmol/L (ref 101–111)
CO2: 25 mmol/L (ref 22–32)
Creatinine, Ser: 0.9 mg/dL (ref 0.61–1.24)
GFR calc Af Amer: >60 mL/min (ref >60)
GFR calc non Af Amer: >60 mL/min (ref >60)
GLUCOSE: 187 mg/dL — AB (ref 65–99)
Potassium: 3.8 mmol/L (ref 3.5–5.1)
Sodium: 134 mmol/L — ABNORMAL LOW (ref 135–145)

## 2016-08-02 LAB — GLUCOSE, CAPILLARY
GLUCOSE-CAPILLARY: 164 mg/dL — AB (ref 65–99)
Glucose-Capillary: 152 mg/dL — ABNORMAL HIGH (ref 65–99)
Glucose-Capillary: 220 mg/dL — ABNORMAL HIGH (ref 65–99)
Glucose-Capillary: 199 mg/dL — ABNORMAL HIGH (ref 65–99)
Glucose-Capillary: 140 mg/dL — ABNORMAL HIGH (ref 65–99)
Glucose-Capillary: 200 mg/dL — ABNORMAL HIGH (ref 65–99)

## 2016-08-02 LAB — CBC
HCT: 25.9 % — ABNORMAL LOW (ref 39.0–52.0)
Hemoglobin: 8.1 g/dL — ABNORMAL LOW (ref 13.0–17.0)
MCH: 30.2 pg (ref 26.0–34.0)
MCHC: 31.3 g/dL (ref 30.0–36.0)
MCV: 96.6 fL (ref 78.0–100.0)
Platelets: 366 10*3/uL (ref 150–400)
RBC: 2.68 MIL/uL — ABNORMAL LOW (ref 4.22–5.81)
RDW: 15.1 % (ref 11.5–15.5)
WBC: 34.1 10*3/uL — AB (ref 4.0–10.5)

## 2016-08-02 LAB — URINE CULTURE
CULTURE: NO GROWTH
Special Requests: NORMAL

## 2016-08-02 LAB — C DIFFICILE QUICK SCREEN W PCR REFLEX
C DIFFICILE (CDIFF) INTERP: NOT DETECTED
C DIFFICILE (CDIFF) TOXIN: NEGATIVE
C DIFFICLE (CDIFF) ANTIGEN: NEGATIVE

## 2016-08-02 SURGERY — LAPAROTOMY, EXPLORATORY
Anesthesia: General | Site: Abdomen

## 2016-08-02 MED ORDER — FENTANYL CITRATE (PF) 100 MCG/2ML IJ SOLN
INTRAMUSCULAR | Status: AC
  Filled 2016-08-02: qty 2

## 2016-08-02 MED ORDER — ROCURONIUM BROMIDE 50 MG/5ML IV SOSY
PREFILLED_SYRINGE | INTRAVENOUS | Status: AC
  Filled 2016-08-02: qty 5

## 2016-08-02 MED ORDER — FENTANYL CITRATE (PF) 100 MCG/2ML IJ SOLN
INTRAMUSCULAR | Status: DC | PRN
  Administered 2016-08-02 (×3): 100 ug via INTRAVENOUS

## 2016-08-02 MED ORDER — ARTIFICIAL TEARS OP OINT
TOPICAL_OINTMENT | OPHTHALMIC | Status: AC
  Filled 2016-08-02: qty 3.5

## 2016-08-02 MED ORDER — LACTATED RINGERS IV SOLN
INTRAVENOUS | Status: DC | PRN
  Administered 2016-08-02: 18:00:00 via INTRAVENOUS

## 2016-08-02 MED ORDER — IOPAMIDOL (ISOVUE-300) INJECTION 61%
100.0000 mL | Freq: Once | INTRAVENOUS | Status: AC | PRN
  Administered 2016-08-02: 100 mL via INTRAVENOUS

## 2016-08-02 MED ORDER — IOPAMIDOL (ISOVUE-300) INJECTION 61%
INTRAVENOUS | Status: AC
  Administered 2016-08-02: 30 mL
  Filled 2016-08-02: qty 30

## 2016-08-02 MED ORDER — SODIUM CHLORIDE 0.9 % IV SOLN
INTRAVENOUS | Status: DC | PRN
  Administered 2016-08-02: 100 ug/h via INTRAVENOUS

## 2016-08-02 MED ORDER — IOPAMIDOL (ISOVUE-300) INJECTION 61%
INTRAVENOUS | Status: AC
Start: 2016-08-02 — End: 2016-08-02
  Administered 2016-08-02: 30 mL
  Filled 2016-08-02: qty 100

## 2016-08-02 MED ORDER — ROCURONIUM BROMIDE 50 MG/5ML IV SOSY
PREFILLED_SYRINGE | INTRAVENOUS | Status: AC
  Filled 2016-08-02: qty 10

## 2016-08-02 MED ORDER — MIDAZOLAM HCL 2 MG/2ML IJ SOLN
INTRAMUSCULAR | Status: AC
  Filled 2016-08-02: qty 2

## 2016-08-02 MED ORDER — FENTANYL CITRATE (PF) 100 MCG/2ML IJ SOLN
INTRAMUSCULAR | Status: AC
Start: 2016-08-02 — End: 2016-08-02
  Filled 2016-08-02: qty 4

## 2016-08-02 MED ORDER — MIDAZOLAM HCL 2 MG/2ML IJ SOLN
INTRAMUSCULAR | Status: DC | PRN
  Administered 2016-08-02: 2 mg via INTRAVENOUS

## 2016-08-02 MED ORDER — PROPOFOL 10 MG/ML IV BOLUS
INTRAVENOUS | Status: AC
  Filled 2016-08-02: qty 20

## 2016-08-02 MED ORDER — ROCURONIUM BROMIDE 100 MG/10ML IV SOLN
INTRAVENOUS | Status: DC | PRN
  Administered 2016-08-02 (×3): 50 mg via INTRAVENOUS

## 2016-08-02 MED ORDER — SODIUM CHLORIDE 0.9 % IV SOLN
500.0000 mL | Freq: Once | INTRAVENOUS | Status: AC
  Administered 2016-08-03: 500 mL via INTRAVENOUS

## 2016-08-02 MED ORDER — 0.9 % SODIUM CHLORIDE (POUR BTL) OPTIME
TOPICAL | Status: DC | PRN
  Administered 2016-08-02 (×3): 1000 mL

## 2016-08-02 MED ORDER — SUCCINYLCHOLINE CHLORIDE 200 MG/10ML IV SOSY
PREFILLED_SYRINGE | INTRAVENOUS | Status: AC
  Filled 2016-08-02: qty 10

## 2016-08-02 MED ORDER — LIDOCAINE 2% (20 MG/ML) 5 ML SYRINGE
INTRAMUSCULAR | Status: AC
  Filled 2016-08-02: qty 5

## 2016-08-02 SURGICAL SUPPLY — 38 items
BLADE SURG ROTATE 9660 (MISCELLANEOUS) ×3 IMPLANT
CANISTER SUCTION 2500CC (MISCELLANEOUS) ×3 IMPLANT
CHLORAPREP W/TINT 26ML (MISCELLANEOUS) ×3 IMPLANT
COVER SURGICAL LIGHT HANDLE (MISCELLANEOUS) ×3 IMPLANT
DRAPE LAPAROSCOPIC ABDOMINAL (DRAPES) ×3 IMPLANT
DRAPE WARM FLUID 44X44 (DRAPE) ×3 IMPLANT
DRSG OPSITE POSTOP 4X10 (GAUZE/BANDAGES/DRESSINGS) IMPLANT
DRSG OPSITE POSTOP 4X8 (GAUZE/BANDAGES/DRESSINGS) IMPLANT
ELECT BLADE 6.5 EXT (BLADE) ×3 IMPLANT
ELECT CAUTERY BLADE 6.4 (BLADE) ×3 IMPLANT
ELECT REM PT RETURN 9FT ADLT (ELECTROSURGICAL) ×3
ELECTRODE REM PT RTRN 9FT ADLT (ELECTROSURGICAL) ×1 IMPLANT
GLOVE BIOGEL PI IND STRL 8 (GLOVE) ×1 IMPLANT
GLOVE BIOGEL PI INDICATOR 8 (GLOVE) ×2
GLOVE ECLIPSE 7.5 STRL STRAW (GLOVE) ×3 IMPLANT
GOWN STRL REUS W/ TWL LRG LVL3 (GOWN DISPOSABLE) ×2 IMPLANT
GOWN STRL REUS W/TWL LRG LVL3 (GOWN DISPOSABLE) ×4
KIT BASIN OR (CUSTOM PROCEDURE TRAY) ×3 IMPLANT
KIT PREVENA INCISION MGT 13 (CANNISTER) ×3 IMPLANT
KIT ROOM TURNOVER OR (KITS) ×3 IMPLANT
LIGASURE IMPACT 36 18CM CVD LR (INSTRUMENTS) IMPLANT
NS IRRIG 1000ML POUR BTL (IV SOLUTION) ×6 IMPLANT
PACK GENERAL/GYN (CUSTOM PROCEDURE TRAY) ×3 IMPLANT
PAD ARMBOARD 7.5X6 YLW CONV (MISCELLANEOUS) ×3 IMPLANT
SEPRAFILM PROCEDURAL PACK 3X5 (MISCELLANEOUS) IMPLANT
SPECIMEN JAR LARGE (MISCELLANEOUS) IMPLANT
SPONGE LAP 18X18 X RAY DECT (DISPOSABLE) IMPLANT
STAPLER VISISTAT 35W (STAPLE) ×3 IMPLANT
SUCTION POOLE TIP (SUCTIONS) ×3 IMPLANT
SUT NOVA 1 T20/GS 25DT (SUTURE) IMPLANT
SUT PDS AB 1 TP1 96 (SUTURE) ×6 IMPLANT
SUT SILK 2 0 SH CR/8 (SUTURE) ×3 IMPLANT
SUT SILK 2 0 TIES 10X30 (SUTURE) ×3 IMPLANT
SUT SILK 3 0 SH CR/8 (SUTURE) ×3 IMPLANT
SUT SILK 3 0 TIES 10X30 (SUTURE) ×3 IMPLANT
SYRINGE IRR TOOMEY STRL 70CC (SYRINGE) ×3 IMPLANT
TOWEL OR 17X26 10 PK STRL BLUE (TOWEL DISPOSABLE) ×3 IMPLANT
YANKAUER SUCT BULB TIP NO VENT (SUCTIONS) IMPLANT
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
## 2016-08-02 NOTE — Progress Notes (Signed)
CT showed free air and fludi collection from PEG tube likely.  The tube does appear to be in the right place, but somehow it is leaking.  The colon appears to be okay.  I believe that he is leaking from his PEG site.  I have spoke with the patient's mother about this and she informed me that he was leaking around the tube for hours after the original insertion.  Marta LamasJames O. Gae BonWyatt, III, MD, FACS [phone] Trauma Surgeon

## 2016-08-02 NOTE — Op Note (Signed)
OPERATIVE REPORT  DATE OF OPERATION: 07/08/2016 - 08/02/2016  PATIENT:  Elmon Elsearren T Haire  36 y.o. male  PRE-OPERATIVE DIAGNOSIS:  Free air  POST-OPERATIVE DIAGNOSIS:  Gastric content leakage from gastrostomy tube entrance site  INDICATION(S) FOR OPERATION:  Leukocytosis and fever after PEG placement  FINDINGS:  Leaking of gastric contents from gastrostomy tube site with bilious fluid in the LUQ and air anteriorly  PROCEDURE:  Procedure(s): EXPLORATORY LAPAROTOMY AND REPAIR OF GASTROSTOMY TUBE  SURGEON:  Surgeon(s): Jimmye NormanJames Yousaf Sainato, MD  ASSISTANT: Jed LimerickScherer, PA-C  ANESTHESIA:   general  COMPLICATIONS:  None  EBL: <20 ml  BLOOD ADMINISTERED: none  DRAINS: Gastrostomy Tube   SPECIMEN:  No Specimen  COUNTS CORRECT:  YES  PROCEDURE DETAILS: The patient was taken to the operating room and placed on the table in supine position. After an adequate amount of inhalation anesthetic was administered through his tracheostomy tube, his abdomen was prepped and draped in the usual sterile manner exposing the abdomen and including his gastrostomy tube in the left upper quadrant.  A proper timeout was performed identifying the patient and procedure to be performed. A midline incision was made down through the upper abdominal wall approximately 10 cm long down to the midline fascia. We incised the midline fascia and then bluntly punctured the peritoneum with a Kelly clamp with gas escaping. With a Richardson retractor in place we could see that the gastrostomy tube entering along the anterior gastric wall just proximal to the greater curvature. Those no evidence of injury to the colon. There was inflammatory exudative peel on the anterior wall of the stomach and gastric contents of the left upper quadrant and in the midline. This fluid was aspirated away freely and irrigated freely with saline solution.  The gastrostomy tube was in place however because it lifted itself away from the gastric wall, gastric  contents were able to escape around the neck of the tube into the peritoneal cavity. This was repaired by placing a pursestring suture of 2-0 silk around the gastrostomy tube in the stomach wall itself and tying it down. Also the bolster inside the stomach was pulled up to the stomach inner surface and secured in place. We then attached the stomach to the anterior abdominal wall using 2-0 silk sutures. On the outside of the abdominal wall the securing flange was pulled down tautly so that the bolster remained up against the anterior gastric wall. 2-0 silk sutures were used to tie around the neck of the external part of the gastrostomy tube bolster/flange. We then irrigated with copious amounts of saline solution. With the tube in place we irrigated with saline solution using a Toomey syringe and found there to be absolutely no leakage of gastric contents.  After irrigation we closed the fascia using running looped #1 PDS suture. Stainless steel staples used to close the skin and an incisional negative pressure wound dressing was applied. All needle counts, sponge counts, and instrument counts were correct.  PATIENT DISPOSITION:  Back to the 2M ICU in stable condition.   Eshan Trupiano 1/31/20187:32 PM

## 2016-08-02 NOTE — Progress Notes (Signed)
Follow up - Trauma and Critical Care  Patient Details:    [name] is an 36 y.o. male.  Lines/tubes : PICC Triple Lumen 07/09/16 PICC Right Brachial 44 cm 0 cm (Active)  Indication for Insertion or Continuance of Line Prolonged intravenous therapies 08/01/2016  8:00 PM  Exposed Catheter (cm) 0 cm 07/23/2016  8:00 PM  Site Assessment Clean;Dry;Intact 08/01/2016  8:00 PM  Lumen #1 Status Infusing 08/01/2016  8:00 PM  Lumen #2 Status Infusing 08/01/2016  8:00 PM  Lumen #3 Status Infusing 08/01/2016  8:00 PM  Dressing Type Transparent;Occlusive 08/01/2016  8:00 PM  Dressing Status Clean;Dry;Intact;Antimicrobial disc in place 08/01/2016  8:00 PM  Line Care Connections checked and tightened 08/01/2016  8:00 PM  Line Adjustment (NICU/IV Team Only) No 07/24/2016  8:00 AM  Dressing Intervention Dressing changed;Antimicrobial disc changed 07/30/2016  2:01 PM  Dressing Change Due 08/06/16 08/01/2016  8:00 PM     Gastrostomy/Enterostomy Percutaneous endoscopic gastrostomy (PEG) 24 Fr. LUQ (Active)  Surrounding Skin Dry;Intact 08/01/2016  8:00 PM  Tube Status Patent 08/01/2016  8:00 PM  Dressing Status Clean;Dry;Intact 08/01/2016  8:00 PM  Dressing Type Split gauze 08/01/2016  8:00 PM     External Urinary Catheter (Active)  Collection Container Standard drainage bag 08/02/2016  6:00 AM  Securement Method Securing device (Describe) 08/02/2016  6:00 AM  Output (mL) 450 mL 08/02/2016  6:00 AM    Microbiology/Sepsis markers: Results for orders placed or performed during the hospital encounter of 07/08/16  Culture, Urine     Status: None   Collection Time: 07/11/16 10:20 AM  Result Value Ref Range Status   Specimen Description URINE, CATHETERIZED  Final   Special Requests NONE  Final   Culture NO GROWTH  Final   Report Status 07/12/2016 FINAL  Final  Culture, blood (routine x 2)     Status: None   Collection Time: 07/11/16 11:06 AM  Result Value Ref Range Status   Specimen Description BLOOD BLOOD LEFT  FOREARM  Final   Special Requests IN PEDIATRIC BOTTLE 3CC  Final   Culture NO GROWTH 5 DAYS  Final   Report Status 07/16/2016 FINAL  Final  Culture, blood (routine x 2)     Status: None   Collection Time: 07/11/16 11:06 AM  Result Value Ref Range Status   Specimen Description BLOOD BLOOD LEFT FOREARM  Final   Special Requests IN PEDIATRIC BOTTLE 2.5CC  Final   Culture NO GROWTH 5 DAYS  Final   Report Status 07/16/2016 FINAL  Final  Culture, respiratory (NON-Expectorated)     Status: None   Collection Time: 07/11/16 11:25 AM  Result Value Ref Range Status   Specimen Description TRACHEAL ASPIRATE  Final   Special Requests NONE  Final   Gram Stain   Final    MODERATE WBC PRESENT, PREDOMINANTLY MONONUCLEAR RARE GRAM POSITIVE COCCI IN PAIRS    Culture   Final    ABUNDANT HAEMOPHILUS INFLUENZAE BETA LACTAMASE NEGATIVE    Report Status 07/13/2016 FINAL  Final  MRSA PCR Screening     Status: None   Collection Time: 07/16/16  1:58 AM  Result Value Ref Range Status   MRSA by PCR NEGATIVE NEGATIVE Final    Comment:        The GeneXpert MRSA Assay (FDA approved for NASAL specimens only), is one component of a comprehensive MRSA colonization surveillance program. It is not intended to diagnose MRSA infection nor to guide or monitor treatment for MRSA infections.  Culture, respiratory (NON-Expectorated)     Status: None   Collection Time: 07/23/16  9:11 AM  Result Value Ref Range Status   Specimen Description TRACHEAL ASPIRATE  Final   Special Requests Normal  Final   Gram Stain   Final    ABUNDANT WBC PRESENT, PREDOMINANTLY PMN ABUNDANT GRAM POSITIVE COCCI IN PAIRS MODERATE GRAM POSITIVE COCCI IN CLUSTERS MODERATE GRAM POSITIVE RODS    Culture MODERATE STAPHYLOCOCCUS AUREUS  Final   Report Status 07/25/2016 FINAL  Final   Organism ID, Bacteria STAPHYLOCOCCUS AUREUS  Final      Susceptibility   Staphylococcus aureus - MIC*    CIPROFLOXACIN <=0.5 SENSITIVE Sensitive      ERYTHROMYCIN 0.5 SENSITIVE Sensitive     GENTAMICIN <=0.5 SENSITIVE Sensitive     OXACILLIN 0.5 SENSITIVE Sensitive     TETRACYCLINE <=1 SENSITIVE Sensitive     VANCOMYCIN <=0.5 SENSITIVE Sensitive     TRIMETH/SULFA <=10 SENSITIVE Sensitive     CLINDAMYCIN <=0.25 SENSITIVE Sensitive     RIFAMPIN <=0.5 SENSITIVE Sensitive     Inducible Clindamycin NEGATIVE Sensitive     * MODERATE STAPHYLOCOCCUS AUREUS  Culture, Urine     Status: Abnormal   Collection Time: 07/24/16 12:15 PM  Result Value Ref Range Status   Specimen Description URINE, RANDOM  Final   Special Requests Normal  Final   Culture <10,000 COLONIES/mL INSIGNIFICANT GROWTH (A)  Final   Report Status 07/25/2016 FINAL  Final  Culture, blood (routine x 2)     Status: None   Collection Time: 07/24/16  1:50 PM  Result Value Ref Range Status   Specimen Description BLOOD BLOOD LEFT ARM  Final   Special Requests BOTTLES DRAWN AEROBIC AND ANAEROBIC 10CC  Final   Culture NO GROWTH 5 DAYS  Final   Report Status 07/29/2016 FINAL  Final  Culture, blood (routine x 2)     Status: Abnormal   Collection Time: 07/24/16  2:00 PM  Result Value Ref Range Status   Specimen Description BLOOD LEFT ANTECUBITAL  Final   Special Requests BOTTLES DRAWN AEROBIC AND ANAEROBIC 10CC  Final   Culture  Setup Time   Final    GRAM POSITIVE COCCI IN CLUSTERS IN BOTH AEROBIC AND ANAEROBIC BOTTLES CRITICAL RESULT CALLED TO, READ BACK BY AND VERIFIED WITH: Lelon Huh PHARMD 1530 07/25/16 M WILSON    Culture (A)  Final    STAPHYLOCOCCUS SPECIES (COAGULASE NEGATIVE) THE SIGNIFICANCE OF ISOLATING THIS ORGANISM FROM A SINGLE SET OF BLOOD CULTURES WHEN MULTIPLE SETS ARE DRAWN IS UNCERTAIN. PLEASE NOTIFY THE MICROBIOLOGY DEPARTMENT WITHIN ONE WEEK IF SPECIATION AND SENSITIVITIES ARE REQUIRED.    Report Status 07/27/2016 FINAL  Final  Blood Culture ID Panel (Reflexed)     Status: Abnormal   Collection Time: 07/24/16  2:00 PM  Result Value Ref Range Status    Enterococcus species NOT DETECTED NOT DETECTED Final   Listeria monocytogenes NOT DETECTED NOT DETECTED Final   Staphylococcus species DETECTED (A) NOT DETECTED Final    Comment: CRITICAL RESULT CALLED TO, READ BACK BY AND VERIFIED WITH: LHosie Poisson.D. 15:30 07/25/16 ([email or web address])    Staphylococcus aureus NOT DETECTED NOT DETECTED Final   Methicillin resistance DETECTED (A) NOT DETECTED Final    Comment: CRITICAL RESULT CALLED TO, READ BACK BY AND VERIFIED WITH: LHosie Poisson.D. 15:30 07/25/16 ([email or web address])    Streptococcus species NOT DETECTED NOT DETECTED Final   Streptococcus agalactiae NOT DETECTED NOT DETECTED Final   Streptococcus pneumoniae NOT DETECTED NOT DETECTED Final  Streptococcus pyogenes NOT DETECTED NOT DETECTED Final   Acinetobacter baumannii NOT DETECTED NOT DETECTED Final   Enterobacteriaceae species NOT DETECTED NOT DETECTED Final   Enterobacter cloacae complex NOT DETECTED NOT DETECTED Final   Escherichia coli NOT DETECTED NOT DETECTED Final   Klebsiella oxytoca NOT DETECTED NOT DETECTED Final   Klebsiella pneumoniae NOT DETECTED NOT DETECTED Final   Proteus species NOT DETECTED NOT DETECTED Final   Serratia marcescens NOT DETECTED NOT DETECTED Final   Haemophilus influenzae NOT DETECTED NOT DETECTED Final   Neisseria meningitidis NOT DETECTED NOT DETECTED Final   Pseudomonas aeruginosa NOT DETECTED NOT DETECTED Final   Candida albicans NOT DETECTED NOT DETECTED Final   Candida glabrata NOT DETECTED NOT DETECTED Final   Candida krusei NOT DETECTED NOT DETECTED Final   Candida parapsilosis NOT DETECTED NOT DETECTED Final   Candida tropicalis NOT DETECTED NOT DETECTED Final    Anti-infectives:  Anti-infectives    Start     Dose/Rate Route Frequency Ordered Stop   08/01/16 2200  vancomycin (VANCOCIN) IVPB 1000 mg/200 mL premix     1,000 mg 200 mL/hr over 60 Minutes Intravenous Every 8 hours 08/01/16 1253     08/01/16 1400  vancomycin (VANCOCIN) 1,750 mg in sodium  chloride 0.9 % 500 mL IVPB     1,750 mg 250 mL/hr over 120 Minutes Intravenous  Once 08/01/16 1253 08/01/16 1648   08/01/16 1330  piperacillin-tazobactam (ZOSYN) IVPB 3.375 g     3.375 g 12.5 mL/hr over 240 Minutes Intravenous Every 8 hours 08/01/16 1249     07/26/16 1200  cefTRIAXone (ROCEPHIN) 2 g in dextrose 5 % 50 mL IVPB     2 g 100 mL/hr over 30 Minutes Intravenous Every 24 hours 07/26/16 1159 08/01/16 1245   07/25/16 1800  vancomycin (VANCOCIN) IVPB 1000 mg/200 mL premix  Status:  Discontinued     1,000 mg 200 mL/hr over 60 Minutes Intravenous Every 8 hours 07/25/16 0835 07/26/16 1159   07/25/16 0900  piperacillin-tazobactam (ZOSYN) IVPB 3.375 g  Status:  Discontinued     3.375 g 12.5 mL/hr over 240 Minutes Intravenous Every 8 hours 07/25/16 0820 07/26/16 1159   07/25/16 0900  vancomycin (VANCOCIN) 2,000 mg in sodium chloride 0.9 % 500 mL IVPB     2,000 mg 250 mL/hr over 120 Minutes Intravenous  Once 07/25/16 0835 07/25/16 1157   07/14/16 0900  cefTRIAXone (ROCEPHIN) 2 g in dextrose 5 % 50 mL IVPB     2 g 100 mL/hr over 30 Minutes Intravenous Every 24 hours 07/14/16 0814 07/21/16 0927   07/11/16 1830  vancomycin (VANCOCIN) IVPB 1000 mg/200 mL premix  Status:  Discontinued     1,000 mg 200 mL/hr over 60 Minutes Intravenous Every 8 hours 07/11/16 0952 07/13/16 0820   07/11/16 1030  vancomycin (VANCOCIN) 2,000 mg in sodium chloride 0.9 % 500 mL IVPB     2,000 mg 250 mL/hr over 120 Minutes Intravenous  Once 07/11/16 0952 07/11/16 1400   07/11/16 0945  piperacillin-tazobactam (ZOSYN) IVPB 3.375 g  Status:  Discontinued     3.375 g 12.5 mL/hr over 240 Minutes Intravenous Every 8 hours 07/11/16 0938 07/14/16 0814   07/08/16 2200  ceFAZolin (ANCEF) IVPB 1 g/50 mL premix  Status:  Discontinued     1 g 100 mL/hr over 30 Minutes Intravenous Every 8 hours 07/08/16 1415 07/11/16 0938   07/08/16 1300  ceFAZolin (ANCEF) IVPB 2g/100 mL premix     2 g 200 mL/hr over 30 Minutes  Intravenous  STAT 07/08/16 1252 07/08/16 1354      Best Practice/Protocols:  VTE Prophylaxis: Lovenox (prophylaxtic dose) and Mechanical GI Prophylaxis: Proton Pump Inhibitor Continous Sedation  Consults:     Events:  Subjective:    Overnight Issues: Precedex only.  Fentanyl has been cut off  Objective:  Vital signs for last 24 hours: Temp:  [98.9 F (37.2 C)-104.1 F (40.1 C)] 100.1 F (37.8 C) (01/31 0800) Pulse Rate:  [97-137] 111 (01/31 0900) Resp:  [0-30] 0 (01/31 0900) BP: (104-180)/(57-111) 130/76 (01/31 0900) SpO2:  [97 %-100 %] 99 % (01/31 0900) FiO2 (%):  [30 %-40 %] 30 % (01/31 0912) Weight:  [104 kg (229 lb 4.5 oz)] 104 kg (229 lb 4.5 oz) (01/31 0500)  Hemodynamic parameters for last 24 hours:    Intake/Output from previous day: 01/30 0701 - 01/31 0700 In: 3490.5 [I.V.:685.5; NG/GT:1755; IV Piggyback:1050] Out: 1175 [Urine:1175]  Intake/Output this shift: No intake/output data recorded.  Vent settings for last 24 hours: Vent Mode: PRVC FiO2 (%):  [30 %-40 %] 30 % Set Rate:  [16 bmp] 16 bmp Vt Set:  [650 mL] 650 mL PEEP:  [5 cmH20] 5 cmH20 Pressure Support:  [10 cmH20] 10 cmH20 Plateau Pressure:  [20 cmH20-25 cmH20] 22 cmH20  Physical Exam:  General: no respiratory distress and weaned for a short ime earlier yesterday. Neuro: nonfocal exam, RASS 0 and RASS -1 Resp: clear to auscultation bilaterally and CXR shows a LLL infiltrate.  I cannot say that it is from the LLL pneumonia CVS: regular rate and rhythm, S1, S2 normal, no murmur, click, rub or gallop GI: soft, nontender, BS WNL, no r/g and Profuse diarrhea.  Tolerating tube feedings Extremities: no edema, no erythema, pulses WNL  Results for orders placed or performed during the hospital encounter of 07/08/16 (from the past 24 hour(s))  Glucose, capillary     Status: Abnormal   Collection Time: 08/01/16 12:04 PM  Result Value Ref Range   Glucose-Capillary 287 (H) 65 - 99 mg/dL   Comment 1 Notify RN     Comment 2 Document in Chart   Glucose, capillary     Status: Abnormal   Collection Time: 08/01/16  4:39 PM  Result Value Ref Range   Glucose-Capillary 270 (H) 65 - 99 mg/dL   Comment 1 Notify RN    Comment 2 Document in Chart   Glucose, capillary     Status: Abnormal   Collection Time: 08/01/16  8:42 PM  Result Value Ref Range   Glucose-Capillary 199 (H) 65 - 99 mg/dL  Glucose, capillary     Status: Abnormal   Collection Time: 08/01/16 11:33 PM  Result Value Ref Range   Glucose-Capillary 270 (H) 65 - 99 mg/dL  Glucose, capillary     Status: Abnormal   Collection Time: 08/02/16  3:59 AM  Result Value Ref Range   Glucose-Capillary 152 (H) 65 - 99 mg/dL  CBC     Status: Abnormal   Collection Time: 08/02/16  7:00 AM  Result Value Ref Range   WBC 34.1 (H) 4.0 - 10.5 K/uL   RBC 2.68 (L) 4.22 - 5.81 MIL/uL   Hemoglobin 8.1 (L) 13.0 - 17.0 g/dL   HCT 16.1 (L) 09.6 - 04.5 %   MCV 96.6 78.0 - 100.0 fL   MCH 30.2 26.0 - 34.0 pg   MCHC 31.3 30.0 - 36.0 g/dL   RDW 40.9 81.1 - 91.4 %   Platelets 366 150 - 400 K/uL  Basic metabolic  panel     Status: Abnormal   Collection Time: 08/02/16  7:00 AM  Result Value Ref Range   Sodium 134 (L) 135 - 145 mmol/L   Potassium 3.8 3.5 - 5.1 mmol/L   Chloride 102 101 - 111 mmol/L   CO2 25 22 - 32 mmol/L   Glucose, Bld 187 (H) 65 - 99 mg/dL   BUN 16 6 - 20 mg/dL   Creatinine, Ser 1.61 0.61 - 1.24 mg/dL   Calcium 8.6 (L) 8.9 - 10.3 mg/dL   GFR calc non Af Amer >60 >60 mL/min   GFR calc Af Amer >60 >60 mL/min   Anion gap 7 5 - 15  Glucose, capillary     Status: Abnormal   Collection Time: 08/02/16  9:26 AM  Result Value Ref Range   Glucose-Capillary 220 (H) 65 - 99 mg/dL     Assessment/Plan:   NEURO  Altered Mental Status:  sedation and does follow commands   Plan: CPM, wean sedation.  PULM  Atelectasis/collapse (focal and LLL) Pneumonia: hospital acquired (not ventilator-associated) Staph infection.  Weaning slowly   Plan: CPM, on  Vancomycin and Zosyn  CARDIO  No specific issues.   Plan: CPM  RENAL  Urine output is marginal, but renal function is good   Plan: Possible Lasix today.  GI  Profuse diarrhea, C.diff sent   Plan: CPM  ID  Pneumonia (hospital acquired (not ventilator-associated) Pulmonary function is good.)   Plan: Continue antibiotics.  HEME  Anemia acute blood loss anemia and anemia of critical illness) Leukocytosis (leukemoid reaction and Source of leukocytosis is unknown)   Plan: May need CT of the abdomen to look for potential source of leukocytosis  ENDO Hyperglycemia (stress related and history of hyperglycemia)   Plan: Increase lantus or Levemir  Global Issues  Patient has not been able to wean from the ventilator consistently.  Stool sent for C.diff testing today.  Abdomen seem benign on physical examination, but will get CT of the abdomen to make sure this is not the source of fevers.    LOS: 25 days   Additional comments:I reviewed the patient's new clinical lab test results. cbc/bmet and I reviewed the patients new imaging test results. cxr  Critical Care Total Time*: 30 Minutes  Ressie Slevin 08/02/2016  *Care during the described time interval was provided by me and/or other providers on the critical care team.  I have reviewed this patient's available data, including medical history, events of note, physical examination and test results as part of my evaluation.
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
## 2016-08-02 NOTE — Progress Notes (Signed)
Pt placed back on full support at this time due to increased RR >40, will rest for now and try wean again later today as toelrated

## 2016-08-02 NOTE — Transfer of Care (Signed)
Immediate Anesthesia Transfer of Care Note  Patient: [name]  Procedure(s) Performed: Procedure(s): EXPLORATORY LAPAROTOMY AND REPAIR OF GASTROSTOMY TUBE (N/A)  Patient Location: PACU  Anesthesia Type:General  Level of Consciousness: Patient remains intubated per anesthesia plan  Airway & Oxygen Therapy: Patient remains intubated per anesthesia plan and Patient placed on Ventilator (see vital sign flow sheet for setting)  Post-op Assessment: Report given to RN and Post -op Vital signs reviewed and stable  Post vital signs: Reviewed and stable  Last Vitals:  Vitals:   08/02/16 1656 08/02/16 1922  BP: (!) 177/101   Pulse: (!) 133 (!) 127  Resp: (!) 28 16  Temp:      Last Pain:  Vitals:   08/02/16 1200  TempSrc: Axillary  PainSc:          Complications: No apparent anesthesia complications

## 2016-08-02 NOTE — Anesthesia Preprocedure Evaluation (Signed)
Anesthesia Evaluation  Patient identified by MRN, date of birth, ID band Patient unresponsive    Reviewed: Allergy & Precautions, NPO status , Patient's Chart, lab work & pertinent test results  Airway Mallampati: Trach       Dental   Pulmonary  Ards Tracheostomy   + rhonchi  + decreased breath sounds      Cardiovascular  Rhythm:Regular Rate:Tachycardia     Neuro/Psych Brain injury    GI/Hepatic Acute abdomen   Endo/Other    Renal/GU      Musculoskeletal   Abdominal   Peds  Hematology   Anesthesia Other Findings   Reproductive/Obstetrics                             Anesthesia Physical Anesthesia Plan  ASA: IV  Anesthesia Plan: General   Post-op Pain Management:    Induction:   Airway Management Planned: Tracheostomy  Additional Equipment:   Intra-op Plan:   Post-operative Plan: Post-operative intubation/ventilation  Informed Consent: I have reviewed the patients History and Physical, chart, labs and discussed the procedure including the risks, benefits and alternatives for the proposed anesthesia with the patient or authorized representative who has indicated his/her understanding and acceptance.     Plan Discussed with: CRNA  Anesthesia Plan Comments:         Anesthesia Quick Evaluation

## 2016-08-02 NOTE — Anesthesia Postprocedure Evaluation (Addendum)
Anesthesia Post Note  Patient: Elmon ElseDarren T Groll  Procedure(s) Performed: Procedure(s) (LRB): EXPLORATORY LAPAROTOMY AND REPAIR OF GASTROSTOMY TUBE (N/A)  Patient location during evaluation: ICU Anesthesia Type: General Level of consciousness: sedated Pain management: pain level controlled Vital Signs Assessment: post-procedure vital signs reviewed and stable Respiratory status: patient remains intubated per anesthesia plan (trach) Cardiovascular status: stable and tachycardic Anesthetic complications: no       Last Vitals:  Vitals:   08/02/16 1656 08/02/16 1922  BP: (!) 177/101   Pulse: (!) 133 (!) 127  Resp: (!) 28 16  Temp:      Last Pain:  Vitals:   08/02/16 1200  TempSrc: Axillary  PainSc:                  Kayly Kriegel

## 2016-08-02 NOTE — Procedures (Signed)
Pt transported to CT on vent, no complications  

## 2016-08-03 ENCOUNTER — Encounter (HOSPITAL_COMMUNITY): Payer: Self-pay | Admitting: General Surgery

## 2016-08-03 LAB — BASIC METABOLIC PANEL
Anion gap: 8 (ref 5–15)
BUN: 20 mg/dL (ref 6–20)
CHLORIDE: 102 mmol/L (ref 101–111)
CO2: 27 mmol/L (ref 22–32)
CREATININE: 0.91 mg/dL (ref 0.61–1.24)
Calcium: 8.3 mg/dL — ABNORMAL LOW (ref 8.9–10.3)
GFR calc Af Amer: >60 mL/min (ref >60)
GFR calc non Af Amer: >60 mL/min (ref >60)
Glucose, Bld: 228 mg/dL — ABNORMAL HIGH (ref 65–99)
Potassium: 3.7 mmol/L (ref 3.5–5.1)
Sodium: 137 mmol/L (ref 135–145)

## 2016-08-03 LAB — GLUCOSE, CAPILLARY
GLUCOSE-CAPILLARY: 148 mg/dL — AB (ref 65–99)
Glucose-Capillary: 195 mg/dL — ABNORMAL HIGH (ref 65–99)
Glucose-Capillary: 196 mg/dL — ABNORMAL HIGH (ref 65–99)
Glucose-Capillary: 198 mg/dL — ABNORMAL HIGH (ref 65–99)
Glucose-Capillary: 224 mg/dL — ABNORMAL HIGH (ref 65–99)
Glucose-Capillary: 232 mg/dL — ABNORMAL HIGH (ref 65–99)
Glucose-Capillary: 233 mg/dL — ABNORMAL HIGH (ref 65–99)

## 2016-08-03 LAB — CBC
HCT: 24.2 % — ABNORMAL LOW (ref 39.0–52.0)
HEMATOCRIT: 26 % — AB (ref 39.0–52.0)
Hemoglobin: 7.7 g/dL — ABNORMAL LOW (ref 13.0–17.0)
Hemoglobin: 8.2 g/dL — ABNORMAL LOW (ref 13.0–17.0)
MCH: 31.4 pg (ref 26.0–34.0)
MCH: 30.8 pg (ref 26.0–34.0)
MCHC: 31.8 g/dL (ref 30.0–36.0)
MCHC: 31.5 g/dL (ref 30.0–36.0)
MCV: 98.8 fL (ref 78.0–100.0)
MCV: 97.7 fL (ref 78.0–100.0)
Platelets: 257 10*3/uL (ref 150–400)
Platelets: 303 10*3/uL (ref 150–400)
RBC: 2.45 MIL/uL — ABNORMAL LOW (ref 4.22–5.81)
RBC: 2.66 MIL/uL — ABNORMAL LOW (ref 4.22–5.81)
RDW: 15.4 % (ref 11.5–15.5)
RDW: 15.3 % (ref 11.5–15.5)
WBC: 19.9 10*3/uL — AB (ref 4.0–10.5)
WBC: 22 10*3/uL — AB (ref 4.0–10.5)

## 2016-08-03 MED ORDER — CHLORHEXIDINE GLUCONATE CLOTH 2 % EX PADS
6.0000 | MEDICATED_PAD | Freq: Every day | CUTANEOUS | Status: DC
  Administered 2016-08-03 – 2016-08-05 (×2): 6 via TOPICAL

## 2016-08-03 MED ORDER — PRO-STAT SUGAR FREE PO LIQD
60.0000 mL | Freq: Four times a day (QID) | ORAL | Status: DC
  Administered 2016-08-03 – 2016-08-10 (×29): 60 mL
  Filled 2016-08-03 (×29): qty 60
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
## 2016-08-03 NOTE — Progress Notes (Signed)
Pt placed back on vent due to increased RR and pt family request.  RT will continue to monitor.
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
## 2016-08-03 NOTE — Progress Notes (Signed)
Pt placed on 40% ATC and tolerating at this time.  RR was initially in the high 50's. RR has now slowed to the high 30's and up to 40 at times.  Pt looks comfortable.  RT will continue to monitor.

## 2016-08-03 NOTE — Progress Notes (Deleted)
Patient had nonsustained episode of sinus bradycardia to 33. Patient alert, denied any dizziness/lightheadness. BP stable. Will continue to monitor. PA made aware.

## 2016-08-04 LAB — CBC
HCT: 26 % — ABNORMAL LOW (ref 39.0–52.0)
Hemoglobin: 8.1 g/dL — ABNORMAL LOW (ref 13.0–17.0)
MCH: 30.1 pg (ref 26.0–34.0)
MCHC: 31.2 g/dL (ref 30.0–36.0)
MCV: 96.7 fL (ref 78.0–100.0)
PLATELETS: 383 10*3/uL (ref 150–400)
RBC: 2.69 MIL/uL — ABNORMAL LOW (ref 4.22–5.81)
RDW: 15.2 % (ref 11.5–15.5)
WBC: 14 10*3/uL — AB (ref 4.0–10.5)

## 2016-08-04 LAB — BASIC METABOLIC PANEL
ANION GAP: 10 (ref 5–15)
BUN: 17 mg/dL (ref 6–20)
CALCIUM: 8.4 mg/dL — AB (ref 8.9–10.3)
CO2: 26 mmol/L (ref 22–32)
Chloride: 103 mmol/L (ref 101–111)
Creatinine, Ser: 0.7 mg/dL (ref 0.61–1.24)
GFR calc Af Amer: >60 mL/min (ref >60)
GLUCOSE: 189 mg/dL — AB (ref 65–99)
Potassium: 3.7 mmol/L (ref 3.5–5.1)
SODIUM: 139 mmol/L (ref 135–145)

## 2016-08-04 LAB — GLUCOSE, CAPILLARY
GLUCOSE-CAPILLARY: 167 mg/dL — AB (ref 65–99)
GLUCOSE-CAPILLARY: 145 mg/dL — AB (ref 65–99)
GLUCOSE-CAPILLARY: 173 mg/dL — AB (ref 65–99)
GLUCOSE-CAPILLARY: 185 mg/dL — AB (ref 65–99)
Glucose-Capillary: 179 mg/dL — ABNORMAL HIGH (ref 65–99)
Glucose-Capillary: 154 mg/dL — ABNORMAL HIGH (ref 65–99)
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
## 2016-08-04 NOTE — Progress Notes (Signed)
Follow up - Trauma and Critical Care  Patient Details:    [name] is an 36 y.o. male.  Lines/tubes : PICC Triple Lumen 07/09/16 PICC Right Brachial 44 cm 0 cm (Active)  Indication for Insertion or Continuance of Line Prolonged intravenous therapies 08/04/2016  8:00 AM  Exposed Catheter (cm) 0 cm 07/23/2016  8:00 PM  Site Assessment Clean;Dry;Intact 08/04/2016  8:00 AM  Lumen #1 Status Infusing 08/04/2016  8:00 AM  Lumen #2 Status Infusing 08/04/2016  8:00 AM  Lumen #3 Status Infusing 08/04/2016  8:00 AM  Dressing Type Transparent;Occlusive 08/04/2016  8:00 AM  Dressing Status Clean;Dry;Intact;Antimicrobial disc in place 08/04/2016  8:00 AM  Line Care Lumen 1 tubing changed;Lumen 3 tubing changed;Connections checked and tightened 08/03/2016 10:00 AM  Line Adjustment (NICU/IV Team Only) No 07/24/2016  8:00 AM  Dressing Intervention Dressing changed;Antimicrobial disc changed 07/30/2016  2:01 PM  Dressing Change Due 08/06/16 08/04/2016  8:00 AM     Negative Pressure Wound Therapy Abdomen Medial (Active)  Last dressing change 08/02/16 08/03/2016  8:00 PM  Site / Wound Assessment Dressing in place / Unable to assess 08/04/2016  8:00 AM  Dressing Status Intact 08/03/2016  8:00 AM  Drainage Amount Scant 08/03/2016  8:00 AM  Drainage Description Serous 08/03/2016  8:00 AM  Output (mL) 0 mL 08/03/2016  8:00 AM     Gastrostomy/Enterostomy Percutaneous endoscopic gastrostomy (PEG) 24 Fr. LUQ (Active)  Surrounding Skin Dry;Intact 08/04/2016  8:00 AM  Tube Status Patent 08/04/2016  8:00 AM  Catheter Position (cm marking) 8 cm 08/02/2016  8:00 AM  Dressing Status Clean;Dry;Intact 08/04/2016  8:00 AM  Dressing Intervention Dressing changed 08/02/2016  8:00 AM  Dressing Type Split gauze 08/03/2016  8:00 PM  G Port Intake (mL) 300 ml 08/02/2016 11:45 AM     External Urinary Catheter (Active)  Collection Container Standard drainage bag 08/03/2016  8:00 PM  Securement Method Securing device (Describe) 08/03/2016  8:00 PM  Output (mL)  15 mL 08/04/2016  8:00 AM    Microbiology/Sepsis markers: Results for orders placed or performed during the hospital encounter of 07/08/16  Culture, Urine     Status: None   Collection Time: 07/11/16 10:20 AM  Result Value Ref Range Status   Specimen Description URINE, CATHETERIZED  Final   Special Requests NONE  Final   Culture NO GROWTH  Final   Report Status 07/12/2016 FINAL  Final  Culture, blood (routine x 2)     Status: None   Collection Time: 07/11/16 11:06 AM  Result Value Ref Range Status   Specimen Description BLOOD BLOOD LEFT FOREARM  Final   Special Requests IN PEDIATRIC BOTTLE 3CC  Final   Culture NO GROWTH 5 DAYS  Final   Report Status 07/16/2016 FINAL  Final  Culture, blood (routine x 2)     Status: None   Collection Time: 07/11/16 11:06 AM  Result Value Ref Range Status   Specimen Description BLOOD BLOOD LEFT FOREARM  Final   Special Requests IN PEDIATRIC BOTTLE 2.5CC  Final   Culture NO GROWTH 5 DAYS  Final   Report Status 07/16/2016 FINAL  Final  Culture, respiratory (NON-Expectorated)     Status: None   Collection Time: 07/11/16 11:25 AM  Result Value Ref Range Status   Specimen Description TRACHEAL ASPIRATE  Final   Special Requests NONE  Final   Gram Stain   Final    MODERATE WBC PRESENT, PREDOMINANTLY MONONUCLEAR RARE GRAM POSITIVE COCCI IN PAIRS  Culture   Final    ABUNDANT HAEMOPHILUS INFLUENZAE BETA LACTAMASE NEGATIVE    Report Status 07/13/2016 FINAL  Final  MRSA PCR Screening     Status: None   Collection Time: 07/16/16  1:58 AM  Result Value Ref Range Status   MRSA by PCR NEGATIVE NEGATIVE Final    Comment:        The GeneXpert MRSA Assay (FDA approved for NASAL specimens only), is one component of a comprehensive MRSA colonization surveillance program. It is not intended to diagnose MRSA infection nor to guide or monitor treatment for MRSA infections.   Culture, respiratory (NON-Expectorated)     Status: None   Collection Time:  07/23/16  9:11 AM  Result Value Ref Range Status   Specimen Description TRACHEAL ASPIRATE  Final   Special Requests Normal  Final   Gram Stain   Final    ABUNDANT WBC PRESENT, PREDOMINANTLY PMN ABUNDANT GRAM POSITIVE COCCI IN PAIRS MODERATE GRAM POSITIVE COCCI IN CLUSTERS MODERATE GRAM POSITIVE RODS    Culture MODERATE STAPHYLOCOCCUS AUREUS  Final   Report Status 07/25/2016 FINAL  Final   Organism ID, Bacteria STAPHYLOCOCCUS AUREUS  Final      Susceptibility   Staphylococcus aureus - MIC*    CIPROFLOXACIN <=0.5 SENSITIVE Sensitive     ERYTHROMYCIN 0.5 SENSITIVE Sensitive     GENTAMICIN <=0.5 SENSITIVE Sensitive     OXACILLIN 0.5 SENSITIVE Sensitive     TETRACYCLINE <=1 SENSITIVE Sensitive     VANCOMYCIN <=0.5 SENSITIVE Sensitive     TRIMETH/SULFA <=10 SENSITIVE Sensitive     CLINDAMYCIN <=0.25 SENSITIVE Sensitive     RIFAMPIN <=0.5 SENSITIVE Sensitive     Inducible Clindamycin NEGATIVE Sensitive     * MODERATE STAPHYLOCOCCUS AUREUS  Culture, Urine     Status: Abnormal   Collection Time: 07/24/16 12:15 PM  Result Value Ref Range Status   Specimen Description URINE, RANDOM  Final   Special Requests Normal  Final   Culture <10,000 COLONIES/mL INSIGNIFICANT GROWTH (A)  Final   Report Status 07/25/2016 FINAL  Final  Culture, blood (routine x 2)     Status: None   Collection Time: 07/24/16  1:50 PM  Result Value Ref Range Status   Specimen Description BLOOD BLOOD LEFT ARM  Final   Special Requests BOTTLES DRAWN AEROBIC AND ANAEROBIC 10CC  Final   Culture NO GROWTH 5 DAYS  Final   Report Status 07/29/2016 FINAL  Final  Culture, blood (routine x 2)     Status: Abnormal   Collection Time: 07/24/16  2:00 PM  Result Value Ref Range Status   Specimen Description BLOOD LEFT ANTECUBITAL  Final   Special Requests BOTTLES DRAWN AEROBIC AND ANAEROBIC 10CC  Final   Culture  Setup Time   Final    GRAM POSITIVE COCCI IN CLUSTERS IN BOTH AEROBIC AND ANAEROBIC BOTTLES CRITICAL RESULT CALLED  TO, READ BACK BY AND VERIFIED WITH: Lelon HuhL CURRAN PHARMD 1530 07/25/16 M WILSON    Culture (A)  Final    STAPHYLOCOCCUS SPECIES (COAGULASE NEGATIVE) THE SIGNIFICANCE OF ISOLATING THIS ORGANISM FROM A SINGLE SET OF BLOOD CULTURES WHEN MULTIPLE SETS ARE DRAWN IS UNCERTAIN. PLEASE NOTIFY THE MICROBIOLOGY DEPARTMENT WITHIN ONE WEEK IF SPECIATION AND SENSITIVITIES ARE REQUIRED.    Report Status 07/27/2016 FINAL  Final  Blood Culture ID Panel (Reflexed)     Status: Abnormal   Collection Time: 07/24/16  2:00 PM  Result Value Ref Range Status   Enterococcus species NOT DETECTED NOT DETECTED Final  Listeria monocytogenes NOT DETECTED NOT DETECTED Final   Staphylococcus species DETECTED (A) NOT DETECTED Final    Comment: CRITICAL RESULT CALLED TO, READ BACK BY AND VERIFIED WITH: LHosie Poisson.D. 15:30 07/25/16 (wilsonm)    Staphylococcus aureus NOT DETECTED NOT DETECTED Final   Methicillin resistance DETECTED (A) NOT DETECTED Final    Comment: CRITICAL RESULT CALLED TO, READ BACK BY AND VERIFIED WITH: LHosie Poisson.D. 15:30 07/25/16 (wilsonm)    Streptococcus species NOT DETECTED NOT DETECTED Final   Streptococcus agalactiae NOT DETECTED NOT DETECTED Final   Streptococcus pneumoniae NOT DETECTED NOT DETECTED Final   Streptococcus pyogenes NOT DETECTED NOT DETECTED Final   Acinetobacter baumannii NOT DETECTED NOT DETECTED Final   Enterobacteriaceae species NOT DETECTED NOT DETECTED Final   Enterobacter cloacae complex NOT DETECTED NOT DETECTED Final   Escherichia coli NOT DETECTED NOT DETECTED Final   Klebsiella oxytoca NOT DETECTED NOT DETECTED Final   Klebsiella pneumoniae NOT DETECTED NOT DETECTED Final   Proteus species NOT DETECTED NOT DETECTED Final   Serratia marcescens NOT DETECTED NOT DETECTED Final   Haemophilus influenzae NOT DETECTED NOT DETECTED Final   Neisseria meningitidis NOT DETECTED NOT DETECTED Final   Pseudomonas aeruginosa NOT DETECTED NOT DETECTED Final   Candida albicans  NOT DETECTED NOT DETECTED Final   Candida glabrata NOT DETECTED NOT DETECTED Final   Candida krusei NOT DETECTED NOT DETECTED Final   Candida parapsilosis NOT DETECTED NOT DETECTED Final   Candida tropicalis NOT DETECTED NOT DETECTED Final  Culture, Urine     Status: None   Collection Time: 08/01/16  2:49 PM  Result Value Ref Range Status   Specimen Description URINE, CLEAN CATCH  Final   Special Requests Normal  Final   Culture NO GROWTH  Final   Report Status 08/02/2016 FINAL  Final  C difficile quick scan w PCR reflex     Status: None   Collection Time: 08/02/16  9:35 AM  Result Value Ref Range Status   C Diff antigen NEGATIVE NEGATIVE Final   C Diff toxin NEGATIVE NEGATIVE Final   C Diff interpretation No C. difficile detected.  Final    Anti-infectives:  Anti-infectives    Start     Dose/Rate Route Frequency Ordered Stop   08/01/16 2200  vancomycin (VANCOCIN) IVPB 1000 mg/200 mL premix  Status:  Discontinued     1,000 mg 200 mL/hr over 60 Minutes Intravenous Every 8 hours 08/01/16 1253 08/03/16 0825   08/01/16 1400  vancomycin (VANCOCIN) 1,750 mg in sodium chloride 0.9 % 500 mL IVPB     1,750 mg 250 mL/hr over 120 Minutes Intravenous  Once 08/01/16 1253 08/01/16 1648   08/01/16 1330  piperacillin-tazobactam (ZOSYN) IVPB 3.375 g     3.375 g 12.5 mL/hr over 240 Minutes Intravenous Every 8 hours 08/01/16 1249     07/26/16 1200  cefTRIAXone (ROCEPHIN) 2 g in dextrose 5 % 50 mL IVPB     2 g 100 mL/hr over 30 Minutes Intravenous Every 24 hours 07/26/16 1159 08/01/16 1245   07/25/16 1800  vancomycin (VANCOCIN) IVPB 1000 mg/200 mL premix  Status:  Discontinued     1,000 mg 200 mL/hr over 60 Minutes Intravenous Every 8 hours 07/25/16 0835 07/26/16 1159   07/25/16 0900  piperacillin-tazobactam (ZOSYN) IVPB 3.375 g  Status:  Discontinued     3.375 g 12.5 mL/hr over 240 Minutes Intravenous Every 8 hours 07/25/16 0820 07/26/16 1159   07/25/16 0900  vancomycin (VANCOCIN) 2,000 mg in  sodium  chloride 0.9 % 500 mL IVPB     2,000 mg 250 mL/hr over 120 Minutes Intravenous  Once 07/25/16 0835 07/25/16 1157   07/14/16 0900  cefTRIAXone (ROCEPHIN) 2 g in dextrose 5 % 50 mL IVPB     2 g 100 mL/hr over 30 Minutes Intravenous Every 24 hours 07/14/16 0814 07/21/16 0927   07/11/16 1830  vancomycin (VANCOCIN) IVPB 1000 mg/200 mL premix  Status:  Discontinued     1,000 mg 200 mL/hr over 60 Minutes Intravenous Every 8 hours 07/11/16 0952 07/13/16 0820   07/11/16 1030  vancomycin (VANCOCIN) 2,000 mg in sodium chloride 0.9 % 500 mL IVPB     2,000 mg 250 mL/hr over 120 Minutes Intravenous  Once 07/11/16 0952 07/11/16 1400   07/11/16 0945  piperacillin-tazobactam (ZOSYN) IVPB 3.375 g  Status:  Discontinued     3.375 g 12.5 mL/hr over 240 Minutes Intravenous Every 8 hours 07/11/16 0938 07/14/16 0814   07/08/16 2200  ceFAZolin (ANCEF) IVPB 1 g/50 mL premix  Status:  Discontinued     1 g 100 mL/hr over 30 Minutes Intravenous Every 8 hours 07/08/16 1415 07/11/16 0938   07/08/16 1300  ceFAZolin (ANCEF) IVPB 2g/100 mL premix     2 g 200 mL/hr over 30 Minutes Intravenous STAT 07/08/16 1252 07/08/16 1354      Best Practice/Protocols:  VTE Prophylaxis: Lovenox (prophylaxtic dose) and Mechanical GI Prophylaxis: Proton Pump Inhibitor Continous Sedation Fentany and Precedex.    Consults:     Events:  Subjective:    Overnight Issues: No specific issues.  Still some fevers.  Objective:  Vital signs for last 24 hours: Temp:  [98.9 F (37.2 C)-101.4 F (38.6 C)] 99.4 F (37.4 C) (02/02 0400) Pulse Rate:  [92-140] 103 (02/02 0800) Resp:  [16-32] 31 (02/02 0800) BP: (112-185)/(63-117) 138/74 (02/02 0800) SpO2:  [93 %-100 %] 100 % (02/02 0800) FiO2 (%):  [30 %] 30 % (02/02 0732) Weight:  [106.6 kg (235 lb 0.2 oz)] 106.6 kg (235 lb 0.2 oz) (02/02 0500)  Hemodynamic parameters for last 24 hours:    Intake/Output from previous day: 02/01 0701 - 02/02 0700 In: 2294 [I.V.:374;  NG/GT:1760; IV Piggyback:150] Out: 1600 [Urine:1600]  Intake/Output this shift: Total I/O In: 86.9 [I.V.:21.9; NG/GT:65] Out: 15 [Urine:15]  Vent settings for last 24 hours: Vent Mode: CPAP;PSV FiO2 (%):  [30 %] 30 % Set Rate:  [16 bmp] 16 bmp Vt Set:  [650 mL] 650 mL PEEP:  [5 cmH20] 5 cmH20 Pressure Support:  [5 cmH20] 5 cmH20 Plateau Pressure:  [23 cmH20-24 cmH20] 24 cmH20  Physical Exam:  General: alert, no respiratory distress and occasionally following commands. Neuro: nonfocal exam, RASS 0 and seems a bit weak on his right side. Resp: clear to auscultation bilaterally and oxygenating well. CVS: regular rate and rhythm, S1, S2 normal, no murmur, click, rub or gallop and intermittent sinus tachycardia GI: soft, nontender, BS WNL, no r/g and tolerating tube feedings and having lots of bowel movements.  C.diff negative.  Rectal tube is out, bed sore better. Extremities: edema 2+ and Edema is better.  Results for orders placed or performed during the hospital encounter of 07/08/16 (from the past 24 hour(s))  Glucose, capillary     Status: Abnormal   Collection Time: 08/03/16 10:22 AM  Result Value Ref Range   Glucose-Capillary 198 (H) 65 - 99 mg/dL  Glucose, capillary     Status: Abnormal   Collection Time: 08/03/16  1:11 PM  Result Value Ref  Range   Glucose-Capillary 195 (H) 65 - 99 mg/dL   Comment 1 Notify RN    Comment 2 Document in Chart   Glucose, capillary     Status: Abnormal   Collection Time: 08/03/16  3:58 PM  Result Value Ref Range   Glucose-Capillary 232 (H) 65 - 99 mg/dL  Glucose, capillary     Status: Abnormal   Collection Time: 08/03/16  8:42 PM  Result Value Ref Range   Glucose-Capillary 148 (H) 65 - 99 mg/dL  Glucose, capillary     Status: Abnormal   Collection Time: 08/04/16 12:00 AM  Result Value Ref Range   Glucose-Capillary 196 (H) 65 - 99 mg/dL  Glucose, capillary     Status: Abnormal   Collection Time: 08/04/16  3:58 AM  Result Value Ref Range    Glucose-Capillary 167 (H) 65 - 99 mg/dL  CBC     Status: Abnormal   Collection Time: 08/04/16  5:45 AM  Result Value Ref Range   WBC 14.0 (H) 4.0 - 10.5 K/uL   RBC 2.69 (L) 4.22 - 5.81 MIL/uL   Hemoglobin 8.1 (L) 13.0 - 17.0 g/dL   HCT 30.826.0 (L) 65.739.0 - 84.652.0 %   MCV 96.7 78.0 - 100.0 fL   MCH 30.1 26.0 - 34.0 pg   MCHC 31.2 30.0 - 36.0 g/dL   RDW 96.215.2 95.211.5 - 84.115.5 %   Platelets 383 150 - 400 K/uL  Basic metabolic panel     Status: Abnormal   Collection Time: 08/04/16  5:45 AM  Result Value Ref Range   Sodium 139 135 - 145 mmol/L   Potassium 3.7 3.5 - 5.1 mmol/L   Chloride 103 101 - 111 mmol/L   CO2 26 22 - 32 mmol/L   Glucose, Bld 189 (H) 65 - 99 mg/dL   BUN 17 6 - 20 mg/dL   Creatinine, Ser 3.240.70 0.61 - 1.24 mg/dL   Calcium 8.4 (L) 8.9 - 10.3 mg/dL   GFR calc non Af Amer >60 >60 mL/min   GFR calc Af Amer >60 >60 mL/min   Anion gap 10 5 - 15  Glucose, capillary     Status: Abnormal   Collection Time: 08/04/16  7:34 AM  Result Value Ref Range   Glucose-Capillary 179 (H) 65 - 99 mg/dL     Assessment/Plan:   NEURO  Altered Mental Status:  agitation and sedation   Plan: Agitation is not bed and patient starting to be able to wean to trach collar.  PULM  Atelectasis/collapse (focal) Improved function   Plan: Wean to trach collar  CARDIO  Sinus Tachycardia   Plan: Non specific.  No specific treatment  RENAL  Urine output and renal function are good   Plan: CPM  GI  s/p Exploratory Laparotomy Gastristinty tube leakage repair   Plan: CPM  ID  Pneumonia (hospital acquired (not ventilator-associated) Being treated.)   Plan: CPM  HEME  Anemia acute blood loss anemia and anemia of critical illness)   Plan: Hemoglobin 8.1, no blood needed for now  ENDO Hyperglycemia (stress related and infection)   Plan: Continue coverage.  Improving now that infections are now controlled  Global Issues  Continue to try to wean ventilator to trach collar.  Will try to be aggressive  with therapies.    LOS: 27 days   Additional comments:I reviewed the patient's new clinical lab test results. cbc/bmet  Critical Care Total Time*: 30 Minutes  Roczen Waymire 08/04/2016  *Care during the described time  interval was provided by me and/or other providers on the critical care team.  I have reviewed this patient's available data, including medical history, events of note, physical examination and test results as part of my evaluation.
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
## 2016-08-04 NOTE — Progress Notes (Signed)
Pt placed Pt on 40% ATC. Pt RR varies. When no one is in the room or touching him his RR are in the 30's to 40's. BP and HR is stable. RT will continue to monitor.

## 2016-08-04 NOTE — Progress Notes (Signed)
Pt has moderate secretions

## 2016-08-05 ENCOUNTER — Inpatient Hospital Stay (HOSPITAL_COMMUNITY): Payer: Medicaid Other

## 2016-08-05 LAB — CBC WITH DIFFERENTIAL/PLATELET
Basophils Absolute: 0 10*3/uL (ref 0.0–0.1)
Basophils Relative: 0 %
EOS ABS: 0.3 10*3/uL (ref 0.0–0.7)
Eosinophils Relative: 3 %
HCT: 25 % — ABNORMAL LOW (ref 39.0–52.0)
HEMOGLOBIN: 7.8 g/dL — AB (ref 13.0–17.0)
LYMPHS ABS: 1.9 10*3/uL (ref 0.7–4.0)
Lymphocytes Relative: 16 %
MCH: 29.9 pg (ref 26.0–34.0)
MCHC: 31.2 g/dL (ref 30.0–36.0)
MCV: 95.8 fL (ref 78.0–100.0)
Monocytes Absolute: 1.4 10*3/uL — ABNORMAL HIGH (ref 0.1–1.0)
Monocytes Relative: 12 %
NEUTROS PCT: 69 %
Neutro Abs: 8.3 10*3/uL — ABNORMAL HIGH (ref 1.7–7.7)
Platelets: 400 10*3/uL (ref 150–400)
RBC: 2.61 MIL/uL — AB (ref 4.22–5.81)
RDW: 15.3 % (ref 11.5–15.5)
WBC: 11.9 10*3/uL — AB (ref 4.0–10.5)

## 2016-08-05 LAB — BASIC METABOLIC PANEL
Anion gap: 10 (ref 5–15)
BUN: 17 mg/dL (ref 6–20)
CHLORIDE: 101 mmol/L (ref 101–111)
CO2: 26 mmol/L (ref 22–32)
Calcium: 8.4 mg/dL — ABNORMAL LOW (ref 8.9–10.3)
Creatinine, Ser: 0.52 mg/dL — ABNORMAL LOW (ref 0.61–1.24)
GFR calc non Af Amer: >60 mL/min (ref >60)
Glucose, Bld: 150 mg/dL — ABNORMAL HIGH (ref 65–99)
POTASSIUM: 4 mmol/L (ref 3.5–5.1)
SODIUM: 137 mmol/L (ref 135–145)

## 2016-08-05 LAB — GLUCOSE, CAPILLARY
GLUCOSE-CAPILLARY: 132 mg/dL — AB (ref 65–99)
GLUCOSE-CAPILLARY: 172 mg/dL — AB (ref 65–99)
GLUCOSE-CAPILLARY: 174 mg/dL — AB (ref 65–99)
GLUCOSE-CAPILLARY: 182 mg/dL — AB (ref 65–99)
Glucose-Capillary: 146 mg/dL — ABNORMAL HIGH (ref 65–99)

## 2016-08-05 NOTE — Progress Notes (Signed)
Follow up - Trauma and Critical Care  Patient Details:    [name] is an 36 y.o. male.  Lines/tubes : PICC Triple Lumen 07/09/16 PICC Right Brachial 44 cm 0 cm (Active)  Indication for Insertion or Continuance of Line Prolonged intravenous therapies 08/04/2016  8:00 AM  Exposed Catheter (cm) 0 cm 07/23/2016  8:00 PM  Site Assessment Clean;Dry;Intact 08/04/2016  8:00 AM  Lumen #1 Status Infusing 08/04/2016  8:00 AM  Lumen #2 Status Infusing 08/04/2016  8:00 AM  Lumen #3 Status Infusing 08/04/2016  8:00 AM  Dressing Type Transparent;Occlusive 08/04/2016  8:00 AM  Dressing Status Clean;Dry;Intact;Antimicrobial disc in place 08/04/2016  8:00 AM  Line Care Lumen 1 tubing changed;Lumen 3 tubing changed;Connections checked and tightened 08/03/2016 10:00 AM  Line Adjustment (NICU/IV Team Only) No 07/24/2016  8:00 AM  Dressing Intervention Dressing changed;Antimicrobial disc changed 07/30/2016  2:01 PM  Dressing Change Due 08/06/16 08/04/2016  8:00 AM     Negative Pressure Wound Therapy Abdomen Medial (Active)  Last dressing change 08/02/16 08/03/2016  8:00 PM  Site / Wound Assessment Dressing in place / Unable to assess 08/04/2016  8:00 AM  Dressing Status Intact 08/03/2016  8:00 AM  Drainage Amount Scant 08/03/2016  8:00 AM  Drainage Description Serous 08/03/2016  8:00 AM  Output (mL) 0 mL 08/03/2016  8:00 AM     Gastrostomy/Enterostomy Percutaneous endoscopic gastrostomy (PEG) 24 Fr. LUQ (Active)  Surrounding Skin Dry;Intact 08/04/2016  8:00 AM  Tube Status Patent 08/04/2016  8:00 AM  Catheter Position (cm marking) 8 cm 08/02/2016  8:00 AM  Dressing Status Clean;Dry;Intact 08/04/2016  8:00 AM  Dressing Intervention Dressing changed 08/02/2016  8:00 AM  Dressing Type Split gauze 08/03/2016  8:00 PM  G Port Intake (mL) 300 ml 08/02/2016 11:45 AM     External Urinary Catheter (Active)  Collection Container Standard drainage bag 08/03/2016  8:00 PM  Securement Method Securing device (Describe) 08/03/2016  8:00 PM  Output (mL)  15 mL 08/04/2016  8:00 AM    Microbiology/Sepsis markers: Results for orders placed or performed during the hospital encounter of 07/08/16  Culture, Urine     Status: None   Collection Time: 07/11/16 10:20 AM  Result Value Ref Range Status   Specimen Description URINE, CATHETERIZED  Final   Special Requests NONE  Final   Culture NO GROWTH  Final   Report Status 07/12/2016 FINAL  Final  Culture, blood (routine x 2)     Status: None   Collection Time: 07/11/16 11:06 AM  Result Value Ref Range Status   Specimen Description BLOOD BLOOD LEFT FOREARM  Final   Special Requests IN PEDIATRIC BOTTLE 3CC  Final   Culture NO GROWTH 5 DAYS  Final   Report Status 07/16/2016 FINAL  Final  Culture, blood (routine x 2)     Status: None   Collection Time: 07/11/16 11:06 AM  Result Value Ref Range Status   Specimen Description BLOOD BLOOD LEFT FOREARM  Final   Special Requests IN PEDIATRIC BOTTLE 2.5CC  Final   Culture NO GROWTH 5 DAYS  Final   Report Status 07/16/2016 FINAL  Final  Culture, respiratory (NON-Expectorated)     Status: None   Collection Time: 07/11/16 11:25 AM  Result Value Ref Range Status   Specimen Description TRACHEAL ASPIRATE  Final   Special Requests NONE  Final   Gram Stain   Final    MODERATE WBC PRESENT, PREDOMINANTLY MONONUCLEAR RARE GRAM POSITIVE COCCI IN PAIRS  Culture   Final    ABUNDANT HAEMOPHILUS INFLUENZAE BETA LACTAMASE NEGATIVE    Report Status 07/13/2016 FINAL  Final  MRSA PCR Screening     Status: None   Collection Time: 07/16/16  1:58 AM  Result Value Ref Range Status   MRSA by PCR NEGATIVE NEGATIVE Final    Comment:        The GeneXpert MRSA Assay (FDA approved for NASAL specimens only), is one component of a comprehensive MRSA colonization surveillance program. It is not intended to diagnose MRSA infection nor to guide or monitor treatment for MRSA infections.   Culture, respiratory (NON-Expectorated)     Status: None   Collection Time:  07/23/16  9:11 AM  Result Value Ref Range Status   Specimen Description TRACHEAL ASPIRATE  Final   Special Requests Normal  Final   Gram Stain   Final    ABUNDANT WBC PRESENT, PREDOMINANTLY PMN ABUNDANT GRAM POSITIVE COCCI IN PAIRS MODERATE GRAM POSITIVE COCCI IN CLUSTERS MODERATE GRAM POSITIVE RODS    Culture MODERATE STAPHYLOCOCCUS AUREUS  Final   Report Status 07/25/2016 FINAL  Final   Organism ID, Bacteria STAPHYLOCOCCUS AUREUS  Final      Susceptibility   Staphylococcus aureus - MIC*    CIPROFLOXACIN <=0.5 SENSITIVE Sensitive     ERYTHROMYCIN 0.5 SENSITIVE Sensitive     GENTAMICIN <=0.5 SENSITIVE Sensitive     OXACILLIN 0.5 SENSITIVE Sensitive     TETRACYCLINE <=1 SENSITIVE Sensitive     VANCOMYCIN <=0.5 SENSITIVE Sensitive     TRIMETH/SULFA <=10 SENSITIVE Sensitive     CLINDAMYCIN <=0.25 SENSITIVE Sensitive     RIFAMPIN <=0.5 SENSITIVE Sensitive     Inducible Clindamycin NEGATIVE Sensitive     * MODERATE STAPHYLOCOCCUS AUREUS  Culture, Urine     Status: Abnormal   Collection Time: 07/24/16 12:15 PM  Result Value Ref Range Status   Specimen Description URINE, RANDOM  Final   Special Requests Normal  Final   Culture <10,000 COLONIES/mL INSIGNIFICANT GROWTH (A)  Final   Report Status 07/25/2016 FINAL  Final  Culture, blood (routine x 2)     Status: None   Collection Time: 07/24/16  1:50 PM  Result Value Ref Range Status   Specimen Description BLOOD BLOOD LEFT ARM  Final   Special Requests BOTTLES DRAWN AEROBIC AND ANAEROBIC 10CC  Final   Culture NO GROWTH 5 DAYS  Final   Report Status 07/29/2016 FINAL  Final  Culture, blood (routine x 2)     Status: Abnormal   Collection Time: 07/24/16  2:00 PM  Result Value Ref Range Status   Specimen Description BLOOD LEFT ANTECUBITAL  Final   Special Requests BOTTLES DRAWN AEROBIC AND ANAEROBIC 10CC  Final   Culture  Setup Time   Final    GRAM POSITIVE COCCI IN CLUSTERS IN BOTH AEROBIC AND ANAEROBIC BOTTLES CRITICAL RESULT CALLED  TO, READ BACK BY AND VERIFIED WITH: Lelon HuhL CURRAN PHARMD 1530 07/25/16 M WILSON    Culture (A)  Final    STAPHYLOCOCCUS SPECIES (COAGULASE NEGATIVE) THE SIGNIFICANCE OF ISOLATING THIS ORGANISM FROM A SINGLE SET OF BLOOD CULTURES WHEN MULTIPLE SETS ARE DRAWN IS UNCERTAIN. PLEASE NOTIFY THE MICROBIOLOGY DEPARTMENT WITHIN ONE WEEK IF SPECIATION AND SENSITIVITIES ARE REQUIRED.    Report Status 07/27/2016 FINAL  Final  Blood Culture ID Panel (Reflexed)     Status: Abnormal   Collection Time: 07/24/16  2:00 PM  Result Value Ref Range Status   Enterococcus species NOT DETECTED NOT DETECTED Final  Listeria monocytogenes NOT DETECTED NOT DETECTED Final   Staphylococcus species DETECTED (A) NOT DETECTED Final    Comment: CRITICAL RESULT CALLED TO, READ BACK BY AND VERIFIED WITH: LHosie Poisson.D. 15:30 07/25/16 ([email or web address])    Staphylococcus aureus NOT DETECTED NOT DETECTED Final   Methicillin resistance DETECTED (A) NOT DETECTED Final    Comment: CRITICAL RESULT CALLED TO, READ BACK BY AND VERIFIED WITH: LHosie Poisson.D. 15:30 07/25/16 ([email or web address])    Streptococcus species NOT DETECTED NOT DETECTED Final   Streptococcus agalactiae NOT DETECTED NOT DETECTED Final   Streptococcus pneumoniae NOT DETECTED NOT DETECTED Final   Streptococcus pyogenes NOT DETECTED NOT DETECTED Final   Acinetobacter baumannii NOT DETECTED NOT DETECTED Final   Enterobacteriaceae species NOT DETECTED NOT DETECTED Final   Enterobacter cloacae complex NOT DETECTED NOT DETECTED Final   Escherichia coli NOT DETECTED NOT DETECTED Final   Klebsiella oxytoca NOT DETECTED NOT DETECTED Final   Klebsiella pneumoniae NOT DETECTED NOT DETECTED Final   Proteus species NOT DETECTED NOT DETECTED Final   Serratia marcescens NOT DETECTED NOT DETECTED Final   Haemophilus influenzae NOT DETECTED NOT DETECTED Final   Neisseria meningitidis NOT DETECTED NOT DETECTED Final   Pseudomonas aeruginosa NOT DETECTED NOT DETECTED Final   Candida albicans  NOT DETECTED NOT DETECTED Final   Candida glabrata NOT DETECTED NOT DETECTED Final   Candida krusei NOT DETECTED NOT DETECTED Final   Candida parapsilosis NOT DETECTED NOT DETECTED Final   Candida tropicalis NOT DETECTED NOT DETECTED Final  Culture, Urine     Status: None   Collection Time: 08/01/16  2:49 PM  Result Value Ref Range Status   Specimen Description URINE, CLEAN CATCH  Final   Special Requests Normal  Final   Culture NO GROWTH  Final   Report Status 08/02/2016 FINAL  Final  C difficile quick scan w PCR reflex     Status: None   Collection Time: 08/02/16  9:35 AM  Result Value Ref Range Status   C Diff antigen NEGATIVE NEGATIVE Final   C Diff toxin NEGATIVE NEGATIVE Final   C Diff interpretation No C. difficile detected.  Final    Anti-infectives:  Anti-infectives    Start     Dose/Rate Route Frequency Ordered Stop   08/01/16 2200  vancomycin (VANCOCIN) IVPB 1000 mg/200 mL premix  Status:  Discontinued     1,000 mg 200 mL/hr over 60 Minutes Intravenous Every 8 hours 08/01/16 1253 08/03/16 0825   08/01/16 1400  vancomycin (VANCOCIN) 1,750 mg in sodium chloride 0.9 % 500 mL IVPB     1,750 mg 250 mL/hr over 120 Minutes Intravenous  Once 08/01/16 1253 08/01/16 1648   08/01/16 1330  piperacillin-tazobactam (ZOSYN) IVPB 3.375 g     3.375 g 12.5 mL/hr over 240 Minutes Intravenous Every 8 hours 08/01/16 1249     07/26/16 1200  cefTRIAXone (ROCEPHIN) 2 g in dextrose 5 % 50 mL IVPB     2 g 100 mL/hr over 30 Minutes Intravenous Every 24 hours 07/26/16 1159 08/01/16 1245   07/25/16 1800  vancomycin (VANCOCIN) IVPB 1000 mg/200 mL premix  Status:  Discontinued     1,000 mg 200 mL/hr over 60 Minutes Intravenous Every 8 hours 07/25/16 0835 07/26/16 1159   07/25/16 0900  piperacillin-tazobactam (ZOSYN) IVPB 3.375 g  Status:  Discontinued     3.375 g 12.5 mL/hr over 240 Minutes Intravenous Every 8 hours 07/25/16 0820 07/26/16 1159   07/25/16 0900  vancomycin (VANCOCIN) 2,000 mg in  sodium  chloride 0.9 % 500 mL IVPB     2,000 mg 250 mL/hr over 120 Minutes Intravenous  Once 07/25/16 0835 07/25/16 1157   07/14/16 0900  cefTRIAXone (ROCEPHIN) 2 g in dextrose 5 % 50 mL IVPB     2 g 100 mL/hr over 30 Minutes Intravenous Every 24 hours 07/14/16 0814 07/21/16 0927   07/11/16 1830  vancomycin (VANCOCIN) IVPB 1000 mg/200 mL premix  Status:  Discontinued     1,000 mg 200 mL/hr over 60 Minutes Intravenous Every 8 hours 07/11/16 0952 07/13/16 0820   07/11/16 1030  vancomycin (VANCOCIN) 2,000 mg in sodium chloride 0.9 % 500 mL IVPB     2,000 mg 250 mL/hr over 120 Minutes Intravenous  Once 07/11/16 0952 07/11/16 1400   07/11/16 0945  piperacillin-tazobactam (ZOSYN) IVPB 3.375 g  Status:  Discontinued     3.375 g 12.5 mL/hr over 240 Minutes Intravenous Every 8 hours 07/11/16 0938 07/14/16 0814   07/08/16 2200  ceFAZolin (ANCEF) IVPB 1 g/50 mL premix  Status:  Discontinued     1 g 100 mL/hr over 30 Minutes Intravenous Every 8 hours 07/08/16 1415 07/11/16 0938   07/08/16 1300  ceFAZolin (ANCEF) IVPB 2g/100 mL premix     2 g 200 mL/hr over 30 Minutes Intravenous STAT 07/08/16 1252 07/08/16 1354      Best Practice/Protocols:  VTE Prophylaxis: Lovenox (prophylaxtic dose) and Mechanical GI Prophylaxis: Proton Pump Inhibitor Continous Sedation Fentany and Precedex.    Consults:  Pt, Ot, SLP, dietician   Events:  Subjective:    Overnight Issues: No specific issues.  Low grade tmax. Tolerated trach collar for about 6 hours yesterday. Just went back on this morning.  Objective:  Vital signs for last 24 hours: Temp:  [99.6 F (37.6 C)-100.9 F (38.3 C)] 99.8 F (37.7 C) (02/03 0800) Pulse Rate:  [88-124] 89 (02/03 0800) Resp:  [16-56] 17 (02/03 0800) BP: (117-171)/(63-108) 151/96 (02/03 0846) SpO2:  [96 %-100 %] 99 % (02/03 0845) FiO2 (%):  [30 %-40 %] 40 % (02/03 0845) Weight:  [108.3 kg (238 lb 12.1 oz)] 108.3 kg (238 lb 12.1 oz) (02/03 0438)  Hemodynamic  parameters for last 24 hours:    Intake/Output from previous day: 02/02 0701 - 02/03 0700 In: 2602.6 [I.V.:642.6; NG/GT:1760; IV Piggyback:150] Out: 1515 [Urine:1515]  Intake/Output this shift: Total I/O In: 92.4 [I.V.:27.4; NG/GT:65] Out: -   Vent settings for last 24 hours: Vent Mode: PRVC FiO2 (%):  [30 %-40 %] 40 % Set Rate:  [16 bmp] 16 bmp Vt Set:  [650 mL] 650 mL PEEP:  [5 cmH20] 5 cmH20 Plateau Pressure:  [22 cmH20-24 cmH20] 24 cmH20  Physical Exam:  Alert, not following commands for me this morning Unlabored but tachypneic to high 30s on Tc, satting well Abdomen soft, nontender, nondistended. Provena to midline holding suction. Peg site looks good.  Extremities mildly edematous  Results for orders placed or performed during the hospital encounter of 07/08/16 (from the past 24 hour(s))  Glucose, capillary     Status: Abnormal   Collection Time: 08/04/16 11:15 AM  Result Value Ref Range   Glucose-Capillary 145 (H) 65 - 99 mg/dL  Glucose, capillary     Status: Abnormal   Collection Time: 08/04/16  3:19 PM  Result Value Ref Range   Glucose-Capillary 154 (H) 65 - 99 mg/dL  Glucose, capillary     Status: Abnormal   Collection Time: 08/04/16  7:24 PM  Result Value Ref Range   Glucose-Capillary 173 (H)  65 - 99 mg/dL  Glucose, capillary     Status: Abnormal   Collection Time: 08/04/16 11:24 PM  Result Value Ref Range   Glucose-Capillary 185 (H) 65 - 99 mg/dL  Glucose, capillary     Status: Abnormal   Collection Time: 08/05/16  3:00 AM  Result Value Ref Range   Glucose-Capillary 132 (H) 65 - 99 mg/dL  CBC with Differential/Platelet     Status: Abnormal   Collection Time: 08/05/16  4:50 AM  Result Value Ref Range   WBC 11.9 (H) 4.0 - 10.5 K/uL   RBC 2.61 (L) 4.22 - 5.81 MIL/uL   Hemoglobin 7.8 (L) 13.0 - 17.0 g/dL   HCT 16.1 (L) 09.6 - 04.5 %   MCV 95.8 78.0 - 100.0 fL   MCH 29.9 26.0 - 34.0 pg   MCHC 31.2 30.0 - 36.0 g/dL   RDW 40.9 81.1 - 91.4 %   Platelets  400 150 - 400 K/uL   Neutrophils Relative % 69 %   Neutro Abs 8.3 (H) 1.7 - 7.7 K/uL   Lymphocytes Relative 16 %   Lymphs Abs 1.9 0.7 - 4.0 K/uL   Monocytes Relative 12 %   Monocytes Absolute 1.4 (H) 0.1 - 1.0 K/uL   Eosinophils Relative 3 %   Eosinophils Absolute 0.3 0.0 - 0.7 K/uL   Basophils Relative 0 %   Basophils Absolute 0.0 0.0 - 0.1 K/uL  Basic metabolic panel     Status: Abnormal   Collection Time: 08/05/16  4:50 AM  Result Value Ref Range   Sodium 137 135 - 145 mmol/L   Potassium 4.0 3.5 - 5.1 mmol/L   Chloride 101 101 - 111 mmol/L   CO2 26 22 - 32 mmol/L   Glucose, Bld 150 (H) 65 - 99 mg/dL   BUN 17 6 - 20 mg/dL   Creatinine, Ser 7.82 (L) 0.61 - 1.24 mg/dL   Calcium 8.4 (L) 8.9 - 10.3 mg/dL   GFR calc non Af Amer >60 >60 mL/min   GFR calc Af Amer >60 >60 mL/min   Anion gap 10 5 - 15     Assessment/Plan:   NEURO  Altered Mental Status:  agitation and sedation   Plan: Wean meds as able  PULM  Atelectasis/collapse (focal) Improved function   Plan: Wean to trach collar  CARDIO  Sinus Tachycardia   Plan: Non specific.  No specific treatment  RENAL  Urine output and renal function are good   Plan: CPM  GI  s/p Exploratory Laparotomy Gastristinty tube leakage repair   Plan: CPM  ID  Pneumonia (hospital acquired (not ventilator-associated) Being treated.)   Plan: CPM  HEME  Anemia acute blood loss anemia and anemia of critical illness)   Plan: Hemoglobin 7.8, no blood needed for now  ENDO Hyperglycemia (stress related and infection)   Plan: Continue coverage.  Improving now that infections are now controlled  Global Issues  Continue to try to wean ventilator to trach collar.  Continue aggressive therapies with speech, Pt, OT.    LOS: 28 days   Additional comments:I reviewed the patient's new clinical lab test results. cbc/bmet  Critical Care Total Time*: 30 Minutes  Codie Krogh A Waymon Laser 08/05/2016  *Care during the described time interval was provided  by me and/or other providers on the critical care team.  I have reviewed this patient's available data, including medical history, events of note, physical examination and test results as part of my evaluation.

## 2016-08-06 LAB — GLUCOSE, CAPILLARY
GLUCOSE-CAPILLARY: 141 mg/dL — AB (ref 65–99)
GLUCOSE-CAPILLARY: 147 mg/dL — AB (ref 65–99)
GLUCOSE-CAPILLARY: 147 mg/dL — AB (ref 65–99)
GLUCOSE-CAPILLARY: 148 mg/dL — AB (ref 65–99)
Glucose-Capillary: 131 mg/dL — ABNORMAL HIGH (ref 65–99)
Glucose-Capillary: 150 mg/dL — ABNORMAL HIGH (ref 65–99)

## 2016-08-06 MED ORDER — HYDROCORTISONE 1 % EX LOTN
TOPICAL_LOTION | Freq: Every day | CUTANEOUS | Status: DC
  Filled 2016-08-06: qty 118

## 2016-08-06 MED ORDER — HYDROCORTISONE 1 % EX CREA
TOPICAL_CREAM | Freq: Every day | CUTANEOUS | Status: DC | PRN
  Filled 2016-08-06: qty 28

## 2016-08-06 MED ORDER — CHLORHEXIDINE GLUCONATE CLOTH 2 % EX PADS
6.0000 | MEDICATED_PAD | Freq: Every morning | CUTANEOUS | Status: DC
  Administered 2016-08-06 – 2016-08-10 (×5): 6 via TOPICAL

## 2016-08-06 NOTE — Progress Notes (Signed)
4 Days Post-Op  Subjective: Tolerated 9 hours of trach collar yesterday. Tolerating tube feeds  Objective: Vital signs in last 24 hours: Temp:  [98.5 F (36.9 C)-101.6 F (38.7 C)] 98.5 F (36.9 C) (02/04 0400) Pulse Rate:  [85-127] 88 (02/04 0806) Resp:  [16-49] 16 (02/04 0806) BP: (109-178)/(65-111) 119/72 (02/04 0806) SpO2:  [89 %-100 %] 100 % (02/04 0806) FiO2 (%):  [40 %-45 %] 40 % (02/04 0806) Weight:  [108.4 kg (238 lb 15.7 oz)] 108.4 kg (238 lb 15.7 oz) (02/04 0500) Last BM Date: 08/05/16  Intake/Output from previous day: 02/03 0701 - 02/04 0700 In: 2415.1 [I.V.:605.1; NG/GT:1560; IV Piggyback:150] Out: 1325 [Urine:1325] Intake/Output this shift: No intake/output data recorded.  Exam: Lungs clear Abdomen soft, small VAC in place  Lab Results:   Recent Labs  08/04/16 0545 08/05/16 0450  WBC 14.0* 11.9*  HGB 8.1* 7.8*  HCT 26.0* 25.0*  PLT 383 400   BMET  Recent Labs  08/04/16 0545 08/05/16 0450  NA 139 137  K 3.7 4.0  CL 103 101  CO2 26 26  GLUCOSE 189* 150*  BUN 17 17  CREATININE 0.70 0.52*  CALCIUM 8.4* 8.4*   PT/INR No results for input(s): LABPROT, INR in the last 72 hours. ABG No results for input(s): PHART, HCO3 in the last 72 hours.  Invalid input(s): PCO2, PO2  Studies/Results: Dg Chest Port 1 View  Result Date: 08/05/2016 CLINICAL DATA:  Chest trauma, pneumonia. EXAM: PORTABLE CHEST 1 VIEW COMPARISON:  Radiograph of August 02, 2016. FINDINGS: Stable cardiomediastinal silhouette. Tracheostomy tube is unchanged in position. Right-sided PICC line is unchanged in position. No pneumothorax is noted. Mild right basilar subsegmental atelectasis is noted. Stable left basilar atelectasis or infiltrate is noted with possible pleural effusion. Bony thorax is unremarkable. IMPRESSION: Mild right basilar subsegmental atelectasis. Stable support apparatus. Stable left basilar atelectasis or infiltrate is noted with possible pleural effusion.  Electronically Signed   By: Lupita RaiderJames  Green Jr, M.D.   On: 08/05/2016 08:22    Anti-infectives: Anti-infectives    Start     Dose/Rate Route Frequency Ordered Stop   08/01/16 2200  vancomycin (VANCOCIN) IVPB 1000 mg/200 mL premix  Status:  Discontinued     1,000 mg 200 mL/hr over 60 Minutes Intravenous Every 8 hours 08/01/16 1253 08/03/16 0825   08/01/16 1400  vancomycin (VANCOCIN) 1,750 mg in sodium chloride 0.9 % 500 mL IVPB     1,750 mg 250 mL/hr over 120 Minutes Intravenous  Once 08/01/16 1253 08/01/16 1648   08/01/16 1330  piperacillin-tazobactam (ZOSYN) IVPB 3.375 g     3.375 g 12.5 mL/hr over 240 Minutes Intravenous Every 8 hours 08/01/16 1249     07/26/16 1200  cefTRIAXone (ROCEPHIN) 2 g in dextrose 5 % 50 mL IVPB     2 g 100 mL/hr over 30 Minutes Intravenous Every 24 hours 07/26/16 1159 08/01/16 1245   07/25/16 1800  vancomycin (VANCOCIN) IVPB 1000 mg/200 mL premix  Status:  Discontinued     1,000 mg 200 mL/hr over 60 Minutes Intravenous Every 8 hours 07/25/16 0835 07/26/16 1159   07/25/16 0900  piperacillin-tazobactam (ZOSYN) IVPB 3.375 g  Status:  Discontinued     3.375 g 12.5 mL/hr over 240 Minutes Intravenous Every 8 hours 07/25/16 0820 07/26/16 1159   07/25/16 0900  vancomycin (VANCOCIN) 2,000 mg in sodium chloride 0.9 % 500 mL IVPB     2,000 mg 250 mL/hr over 120 Minutes Intravenous  Once 07/25/16 0835 07/25/16 1157  07/14/16 0900  cefTRIAXone (ROCEPHIN) 2 g in dextrose 5 % 50 mL IVPB     2 g 100 mL/hr over 30 Minutes Intravenous Every 24 hours 07/14/16 0814 07/21/16 0927   07/11/16 1830  vancomycin (VANCOCIN) IVPB 1000 mg/200 mL premix  Status:  Discontinued     1,000 mg 200 mL/hr over 60 Minutes Intravenous Every 8 hours 07/11/16 0952 07/13/16 0820   07/11/16 1030  vancomycin (VANCOCIN) 2,000 mg in sodium chloride 0.9 % 500 mL IVPB     2,000 mg 250 mL/hr over 120 Minutes Intravenous  Once 07/11/16 0952 07/11/16 1400   07/11/16 0945  piperacillin-tazobactam (ZOSYN)  IVPB 3.375 g  Status:  Discontinued     3.375 g 12.5 mL/hr over 240 Minutes Intravenous Every 8 hours 07/11/16 0938 07/14/16 0814   07/08/16 2200  ceFAZolin (ANCEF) IVPB 1 g/50 mL premix  Status:  Discontinued     1 g 100 mL/hr over 30 Minutes Intravenous Every 8 hours 07/08/16 1415 07/11/16 0938   07/08/16 1300  ceFAZolin (ANCEF) IVPB 2g/100 mL premix     2 g 200 mL/hr over 30 Minutes Intravenous STAT 07/08/16 1252 07/08/16 1354      Assessment/Plan: s/p Procedure(s): EXPLORATORY LAPAROTOMY AND REPAIR OF GASTROSTOMY TUBE (N/A)  Continue trach trials Continue tube feeds Cortisone cream to rash on face at family request  LOS: 29 days    Samson Ralph A 08/06/2016

## 2016-08-06 NOTE — Progress Notes (Signed)
RT Note: Patient was placed back on his rest mode due to desaturating to 83% per his RN. When RT arrived to the room he was saturating 87%. He quickly came up to 100% after being placed back on the ventilator and suctioned. No plugs were noted, just a moderate amount of thick white secretions. Patient is tolerating the ventilator well with no complications. Rt will continue to monitor.

## 2016-08-07 LAB — GLUCOSE, CAPILLARY
GLUCOSE-CAPILLARY: 147 mg/dL — AB (ref 65–99)
GLUCOSE-CAPILLARY: 128 mg/dL — AB (ref 65–99)
Glucose-Capillary: 123 mg/dL — ABNORMAL HIGH (ref 65–99)
Glucose-Capillary: 136 mg/dL — ABNORMAL HIGH (ref 65–99)
Glucose-Capillary: 153 mg/dL — ABNORMAL HIGH (ref 65–99)
Glucose-Capillary: 167 mg/dL — ABNORMAL HIGH (ref 65–99)
Glucose-Capillary: 176 mg/dL — ABNORMAL HIGH (ref 65–99)
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
## 2016-08-08 ENCOUNTER — Inpatient Hospital Stay (HOSPITAL_COMMUNITY): Payer: Medicaid Other

## 2016-08-08 LAB — CBC
HCT: 25.7 % — ABNORMAL LOW (ref 39.0–52.0)
HEMOGLOBIN: 8.3 g/dL — AB (ref 13.0–17.0)
MCH: 30.9 pg (ref 26.0–34.0)
MCHC: 32.3 g/dL (ref 30.0–36.0)
MCV: 95.5 fL (ref 78.0–100.0)
PLATELETS: 462 10*3/uL — AB (ref 150–400)
RBC: 2.69 MIL/uL — AB (ref 4.22–5.81)
RDW: 16 % — ABNORMAL HIGH (ref 11.5–15.5)
WBC: 14.1 10*3/uL — AB (ref 4.0–10.5)

## 2016-08-08 LAB — BASIC METABOLIC PANEL WITH GFR
Anion gap: 8 (ref 5–15)
BUN: 15 mg/dL (ref 6–20)
CO2: 29 mmol/L (ref 22–32)
Calcium: 8.7 mg/dL — ABNORMAL LOW (ref 8.9–10.3)
Chloride: 99 mmol/L — ABNORMAL LOW (ref 101–111)
Creatinine, Ser: 0.73 mg/dL (ref 0.61–1.24)
GFR calc Af Amer: >60 mL/min
GFR calc non Af Amer: >60 mL/min
Glucose, Bld: 141 mg/dL — ABNORMAL HIGH (ref 65–99)
Potassium: 4.4 mmol/L (ref 3.5–5.1)
Sodium: 136 mmol/L (ref 135–145)

## 2016-08-08 LAB — GLUCOSE, CAPILLARY
GLUCOSE-CAPILLARY: 143 mg/dL — AB (ref 65–99)
GLUCOSE-CAPILLARY: 141 mg/dL — AB (ref 65–99)
GLUCOSE-CAPILLARY: 141 mg/dL — AB (ref 65–99)
Glucose-Capillary: 153 mg/dL — ABNORMAL HIGH (ref 65–99)
Glucose-Capillary: 167 mg/dL — ABNORMAL HIGH (ref 65–99)
Glucose-Capillary: 195 mg/dL — ABNORMAL HIGH (ref 65–99)

## 2016-08-08 MED ORDER — LEVALBUTEROL HCL 0.63 MG/3ML IN NEBU
0.6300 mg | INHALATION_SOLUTION | Freq: Three times a day (TID) | RESPIRATORY_TRACT | Status: DC
  Administered 2016-08-08 – 2016-08-18 (×32): 0.63 mg via RESPIRATORY_TRACT
  Filled 2016-08-08 (×32): qty 3

## 2016-08-08 MED ORDER — IPRATROPIUM BROMIDE 0.02 % IN SOLN
0.5000 mg | Freq: Three times a day (TID) | RESPIRATORY_TRACT | Status: DC
  Administered 2016-08-08 – 2016-08-18 (×32): 0.5 mg via RESPIRATORY_TRACT
  Filled 2016-08-08 (×32): qty 2.5

## 2016-08-08 NOTE — Progress Notes (Signed)
Pt has now been weaning to trach collar for the last 24 hours, and is now following commands.  He presented yesterday as a Rancho III with localized responses.   Will need to be a Rancho IV for inpatient rehab admission; continue TBI team therapies.  Will continue to follow progress.    Quintella BatonJulie W. Lenwood Balsam, RN, BSN  Trauma/Neuro ICU Case Manager [phone]
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
## 2016-08-09 LAB — GLUCOSE, CAPILLARY
GLUCOSE-CAPILLARY: 158 mg/dL — AB (ref 65–99)
GLUCOSE-CAPILLARY: 154 mg/dL — AB (ref 65–99)
Glucose-Capillary: 140 mg/dL — ABNORMAL HIGH (ref 65–99)
Glucose-Capillary: 143 mg/dL — ABNORMAL HIGH (ref 65–99)
Glucose-Capillary: 156 mg/dL — ABNORMAL HIGH (ref 65–99)
Glucose-Capillary: 161 mg/dL — ABNORMAL HIGH (ref 65–99)

## 2016-08-09 LAB — BASIC METABOLIC PANEL
Anion gap: 12 (ref 5–15)
BUN: 16 mg/dL (ref 6–20)
CALCIUM: 8.9 mg/dL (ref 8.9–10.3)
CO2: 27 mmol/L (ref 22–32)
CREATININE: 0.74 mg/dL (ref 0.61–1.24)
Chloride: 97 mmol/L — ABNORMAL LOW (ref 101–111)
GFR calc Af Amer: >60 mL/min (ref >60)
GFR calc non Af Amer: >60 mL/min (ref >60)
GLUCOSE: 150 mg/dL — AB (ref 65–99)
Potassium: 4.2 mmol/L (ref 3.5–5.1)
Sodium: 136 mmol/L (ref 135–145)

## 2016-08-09 LAB — CBC
HEMATOCRIT: 28.2 % — AB (ref 39.0–52.0)
Hemoglobin: 8.8 g/dL — ABNORMAL LOW (ref 13.0–17.0)
MCH: 29.5 pg (ref 26.0–34.0)
MCHC: 31.2 g/dL (ref 30.0–36.0)
MCV: 94.6 fL (ref 78.0–100.0)
Platelets: 523 10*3/uL — ABNORMAL HIGH (ref 150–400)
RBC: 2.98 MIL/uL — ABNORMAL LOW (ref 4.22–5.81)
RDW: 15.5 % (ref 11.5–15.5)
WBC: 16.7 10*3/uL — ABNORMAL HIGH (ref 4.0–10.5)

## 2016-08-09 MED ORDER — ORAL CARE MOUTH RINSE
15.0000 mL | Freq: Four times a day (QID) | OROMUCOSAL | Status: DC
  Administered 2016-08-09 – 2016-08-18 (×35): 15 mL via OROMUCOSAL
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
## 2016-08-09 NOTE — Progress Notes (Signed)
This note also relates to the following rows which could not be included: SpO2 - Cannot attach notes to unvalidated device data  Titrated to 6l 28%, sat 98%. RN notified

## 2016-08-09 NOTE — Progress Notes (Signed)
RN called RT to assess patient. Patient HR 150s and RR 40s. RT suctioned patient but patient showed no improvement. RT placed patient back on full vent support. After 5 minutes, Patient's HR down to 133 and RR 23. Patient does not appear to be in respiratory distress. RT will continue to monitor as needed.
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
## 2016-08-10 ENCOUNTER — Inpatient Hospital Stay (HOSPITAL_COMMUNITY): Payer: Medicaid Other

## 2016-08-10 LAB — CBC WITH DIFFERENTIAL/PLATELET
BASOS PCT: 0 %
Basophils Absolute: 0 10*3/uL (ref 0.0–0.1)
EOS ABS: 0.1 10*3/uL (ref 0.0–0.7)
Eosinophils Relative: 0 %
HCT: 29.7 % — ABNORMAL LOW (ref 39.0–52.0)
Hemoglobin: 9.2 g/dL — ABNORMAL LOW (ref 13.0–17.0)
LYMPHS ABS: 3.1 10*3/uL (ref 0.7–4.0)
Lymphocytes Relative: 15 %
MCH: 29.7 pg (ref 26.0–34.0)
MCHC: 31 g/dL (ref 30.0–36.0)
MCV: 95.8 fL (ref 78.0–100.0)
MONO ABS: 0.8 10*3/uL (ref 0.1–1.0)
MONOS PCT: 4 %
Neutro Abs: 16.9 10*3/uL — ABNORMAL HIGH (ref 1.7–7.7)
Neutrophils Relative %: 81 %
Platelets: 584 10*3/uL — ABNORMAL HIGH (ref 150–400)
RBC: 3.1 MIL/uL — ABNORMAL LOW (ref 4.22–5.81)
RDW: 15.9 % — AB (ref 11.5–15.5)
WBC: 20.9 10*3/uL — ABNORMAL HIGH (ref 4.0–10.5)

## 2016-08-10 LAB — GLUCOSE, CAPILLARY
GLUCOSE-CAPILLARY: 103 mg/dL — AB (ref 65–99)
GLUCOSE-CAPILLARY: 127 mg/dL — AB (ref 65–99)
GLUCOSE-CAPILLARY: 176 mg/dL — AB (ref 65–99)
Glucose-Capillary: 144 mg/dL — ABNORMAL HIGH (ref 65–99)
Glucose-Capillary: 171 mg/dL — ABNORMAL HIGH (ref 65–99)
Glucose-Capillary: 138 mg/dL — ABNORMAL HIGH (ref 65–99)

## 2016-08-10 MED ORDER — IOPAMIDOL (ISOVUE-300) INJECTION 61%
15.0000 mL | INTRAVENOUS | Status: AC
  Administered 2016-08-10 (×2): 15 mL via ORAL

## 2016-08-10 MED ORDER — FENTANYL CITRATE (PF) 100 MCG/2ML IJ SOLN
25.0000 ug | INTRAMUSCULAR | Status: DC | PRN
  Administered 2016-08-13 (×2): 50 ug via INTRAVENOUS
  Filled 2016-08-10 (×2): qty 2

## 2016-08-10 MED ORDER — PRO-STAT SUGAR FREE PO LIQD
30.0000 mL | Freq: Every day | ORAL | Status: DC
  Administered 2016-08-10 – 2016-08-18 (×38): 30 mL
  Filled 2016-08-10 (×37): qty 30

## 2016-08-10 MED ORDER — CHLORHEXIDINE GLUCONATE 0.12 % MT SOLN
OROMUCOSAL | Status: AC
  Filled 2016-08-10: qty 15

## 2016-08-10 MED ORDER — FREE WATER
200.0000 mL | Freq: Two times a day (BID) | Status: DC
  Administered 2016-08-10 (×2): 200 mL

## 2016-08-10 MED ORDER — IOPAMIDOL (ISOVUE-300) INJECTION 61%
INTRAVENOUS | Status: AC
  Filled 2016-08-10: qty 100

## 2016-08-10 MED ORDER — HYDROCODONE-ACETAMINOPHEN 7.5-325 MG/15ML PO SOLN
15.0000 mL | ORAL | Status: DC | PRN
  Administered 2016-08-10 – 2016-08-17 (×10): 15 mL
  Filled 2016-08-10 (×10): qty 15

## 2016-08-10 NOTE — Progress Notes (Signed)
This note also relates to the following rows which could not be included: SpO2 - Cannot attach notes to unvalidated device data  RT removed trach suture on pt's left per trauma MD order. Rt suture was missing. RT and RN preformed all trach care including suctioning, changing the inner cannula, cleaning around the hub of the trach and the patient's skin, dried the patient and placed new drain gauze, and new trach ties. Pt tolerated well.
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
## 2016-08-10 NOTE — Progress Notes (Signed)
Round on pt. Noted vomit all over bed, patient, and floor. Tube feedings turned off. Pt cleaned. Trach care performed. Dr. Lindie SpruceWyatt paged and notified. Explained that RR beginning to trend down to upper 30's-40's; heart rate beginning to trend downward to 130-120bpm.  Instructed to put PEG to LIS x1hr and then leave PEG clamped rest of the night.  Will continue to monitor closely.

## 2016-08-10 NOTE — Progress Notes (Signed)
MD called to notified tube feedings. Order to resume tube feedings to 20cc/hr and continue to give meds thru PEG. Will continue to monitor closely.
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
## 2016-08-10 NOTE — Progress Notes (Signed)
Fentanyl drip wasted in sink, 250 cc. Marlana Latusasey Bruno RN as witness.

## 2016-08-11 LAB — CBC
HCT: 28.4 % — ABNORMAL LOW (ref 39.0–52.0)
Hemoglobin: 8.7 g/dL — ABNORMAL LOW (ref 13.0–17.0)
MCH: 29.4 pg (ref 26.0–34.0)
MCHC: 30.6 g/dL (ref 30.0–36.0)
MCV: 95.9 fL (ref 78.0–100.0)
Platelets: 576 10*3/uL — ABNORMAL HIGH (ref 150–400)
RBC: 2.96 MIL/uL — ABNORMAL LOW (ref 4.22–5.81)
RDW: 15.8 % — AB (ref 11.5–15.5)
WBC: 18.9 10*3/uL — ABNORMAL HIGH (ref 4.0–10.5)

## 2016-08-11 LAB — GLUCOSE, CAPILLARY
GLUCOSE-CAPILLARY: 119 mg/dL — AB (ref 65–99)
Glucose-Capillary: 128 mg/dL — ABNORMAL HIGH (ref 65–99)
Glucose-Capillary: 153 mg/dL — ABNORMAL HIGH (ref 65–99)
Glucose-Capillary: 147 mg/dL — ABNORMAL HIGH (ref 65–99)
Glucose-Capillary: 137 mg/dL — ABNORMAL HIGH (ref 65–99)

## 2016-08-11 LAB — BASIC METABOLIC PANEL
Anion gap: 9 (ref 5–15)
BUN: 18 mg/dL (ref 6–20)
CALCIUM: 9.1 mg/dL (ref 8.9–10.3)
CO2: 25 mmol/L (ref 22–32)
CREATININE: 0.71 mg/dL (ref 0.61–1.24)
Chloride: 103 mmol/L (ref 101–111)
Glucose, Bld: 144 mg/dL — ABNORMAL HIGH (ref 65–99)
Potassium: 3.7 mmol/L (ref 3.5–5.1)
SODIUM: 137 mmol/L (ref 135–145)

## 2016-08-11 MED ORDER — GLUCERNA 1.2 CAL PO LIQD
1000.0000 mL | ORAL | Status: DC
  Filled 2016-08-11 (×3): qty 1000

## 2016-08-11 MED ORDER — FREE WATER
150.0000 mL | Freq: Two times a day (BID) | Status: DC
  Administered 2016-08-11 – 2016-08-18 (×15): 150 mL

## 2016-08-11 NOTE — Progress Notes (Signed)
Patient ATC setup changed. No complications. Vital signs stable at this time. RT will continue to monitor.
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
## 2016-08-12 LAB — CBC
HCT: 28.6 % — ABNORMAL LOW (ref 39.0–52.0)
Hemoglobin: 8.9 g/dL — ABNORMAL LOW (ref 13.0–17.0)
MCH: 30 pg (ref 26.0–34.0)
MCHC: 31.1 g/dL (ref 30.0–36.0)
MCV: 96.3 fL (ref 78.0–100.0)
PLATELETS: 581 10*3/uL — AB (ref 150–400)
RBC: 2.97 MIL/uL — ABNORMAL LOW (ref 4.22–5.81)
RDW: 15.7 % — AB (ref 11.5–15.5)
WBC: 17.5 10*3/uL — ABNORMAL HIGH (ref 4.0–10.5)

## 2016-08-12 LAB — BASIC METABOLIC PANEL
Anion gap: 10 (ref 5–15)
BUN: 18 mg/dL (ref 6–20)
CALCIUM: 9 mg/dL (ref 8.9–10.3)
CO2: 26 mmol/L (ref 22–32)
Chloride: 102 mmol/L (ref 101–111)
Creatinine, Ser: 0.67 mg/dL (ref 0.61–1.24)
GFR calc Af Amer: >60 mL/min (ref >60)
GLUCOSE: 106 mg/dL — AB (ref 65–99)
Potassium: 3.6 mmol/L (ref 3.5–5.1)
Sodium: 138 mmol/L (ref 135–145)

## 2016-08-12 LAB — GLUCOSE, CAPILLARY
GLUCOSE-CAPILLARY: 115 mg/dL — AB (ref 65–99)
Glucose-Capillary: 101 mg/dL — ABNORMAL HIGH (ref 65–99)
Glucose-Capillary: 106 mg/dL — ABNORMAL HIGH (ref 65–99)
Glucose-Capillary: 134 mg/dL — ABNORMAL HIGH (ref 65–99)
Glucose-Capillary: 152 mg/dL — ABNORMAL HIGH (ref 65–99)
Glucose-Capillary: 153 mg/dL — ABNORMAL HIGH (ref 65–99)
Glucose-Capillary: 166 mg/dL — ABNORMAL HIGH (ref 65–99)

## 2016-08-12 MED ORDER — GLUCERNA 1.2 CAL PO LIQD
1000.0000 mL | ORAL | Status: DC
  Administered 2016-08-13 – 2016-08-17 (×3): 1000 mL
  Filled 2016-08-12 (×8): qty 1000
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
## 2016-08-13 LAB — GLUCOSE, CAPILLARY
GLUCOSE-CAPILLARY: 167 mg/dL — AB (ref 65–99)
GLUCOSE-CAPILLARY: 151 mg/dL — AB (ref 65–99)
GLUCOSE-CAPILLARY: 116 mg/dL — AB (ref 65–99)
Glucose-Capillary: 135 mg/dL — ABNORMAL HIGH (ref 65–99)
Glucose-Capillary: 107 mg/dL — ABNORMAL HIGH (ref 65–99)
Glucose-Capillary: 125 mg/dL — ABNORMAL HIGH (ref 65–99)

## 2016-08-13 LAB — CBC
HEMATOCRIT: 29.5 % — AB (ref 39.0–52.0)
Hemoglobin: 9.1 g/dL — ABNORMAL LOW (ref 13.0–17.0)
MCH: 29.5 pg (ref 26.0–34.0)
MCHC: 30.8 g/dL (ref 30.0–36.0)
MCV: 95.8 fL (ref 78.0–100.0)
PLATELETS: 602 10*3/uL — AB (ref 150–400)
RBC: 3.08 MIL/uL — ABNORMAL LOW (ref 4.22–5.81)
RDW: 15.9 % — AB (ref 11.5–15.5)
WBC: 16.8 10*3/uL — ABNORMAL HIGH (ref 4.0–10.5)

## 2016-08-13 LAB — BASIC METABOLIC PANEL
Anion gap: 7 (ref 5–15)
BUN: 14 mg/dL (ref 6–20)
CALCIUM: 9.1 mg/dL (ref 8.9–10.3)
CO2: 28 mmol/L (ref 22–32)
CREATININE: 0.76 mg/dL (ref 0.61–1.24)
Chloride: 101 mmol/L (ref 101–111)
GFR calc Af Amer: >60 mL/min (ref >60)
GFR calc non Af Amer: >60 mL/min (ref >60)
GLUCOSE: 110 mg/dL — AB (ref 65–99)
Potassium: 3.8 mmol/L (ref 3.5–5.1)
Sodium: 136 mmol/L (ref 135–145)
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
## 2016-08-14 LAB — CBC
HCT: 29.6 % — ABNORMAL LOW (ref 39.0–52.0)
HEMATOCRIT: 30.8 % — AB (ref 39.0–52.0)
HEMOGLOBIN: 9.5 g/dL — AB (ref 13.0–17.0)
Hemoglobin: 9.6 g/dL — ABNORMAL LOW (ref 13.0–17.0)
MCH: 29.7 pg (ref 26.0–34.0)
MCH: 30.3 pg (ref 26.0–34.0)
MCHC: 31.2 g/dL (ref 30.0–36.0)
MCHC: 32.1 g/dL (ref 30.0–36.0)
MCV: 95.4 fL (ref 78.0–100.0)
MCV: 94.3 fL (ref 78.0–100.0)
Platelets: 600 10*3/uL — ABNORMAL HIGH (ref 150–400)
Platelets: 570 10*3/uL — ABNORMAL HIGH (ref 150–400)
RBC: 3.23 MIL/uL — ABNORMAL LOW (ref 4.22–5.81)
RBC: 3.14 MIL/uL — ABNORMAL LOW (ref 4.22–5.81)
RDW: 15.9 % — AB (ref 11.5–15.5)
RDW: 15.9 % — ABNORMAL HIGH (ref 11.5–15.5)
WBC: 17.1 10*3/uL — AB (ref 4.0–10.5)
WBC: 13.9 10*3/uL — ABNORMAL HIGH (ref 4.0–10.5)

## 2016-08-14 LAB — GLUCOSE, CAPILLARY
GLUCOSE-CAPILLARY: 99 mg/dL (ref 65–99)
GLUCOSE-CAPILLARY: 130 mg/dL — AB (ref 65–99)
GLUCOSE-CAPILLARY: 116 mg/dL — AB (ref 65–99)
Glucose-Capillary: 118 mg/dL — ABNORMAL HIGH (ref 65–99)
Glucose-Capillary: 122 mg/dL — ABNORMAL HIGH (ref 65–99)
Glucose-Capillary: 138 mg/dL — ABNORMAL HIGH (ref 65–99)

## 2016-08-14 LAB — BASIC METABOLIC PANEL
ANION GAP: 11 (ref 5–15)
BUN: 13 mg/dL (ref 6–20)
CO2: 26 mmol/L (ref 22–32)
Calcium: 9.4 mg/dL (ref 8.9–10.3)
Chloride: 100 mmol/L — ABNORMAL LOW (ref 101–111)
Creatinine, Ser: 0.72 mg/dL (ref 0.61–1.24)
GFR calc Af Amer: >60 mL/min (ref >60)
GFR calc non Af Amer: >60 mL/min (ref >60)
GLUCOSE: 96 mg/dL (ref 65–99)
Potassium: 4.1 mmol/L (ref 3.5–5.1)
Sodium: 137 mmol/L (ref 135–145)

## 2016-08-14 MED ORDER — BETHANECHOL CHLORIDE 25 MG PO TABS
25.0000 mg | ORAL_TABLET | Freq: Four times a day (QID) | ORAL | Status: DC
  Administered 2016-08-14 – 2016-08-18 (×16): 25 mg
  Filled 2016-08-14 (×15): qty 1

## 2016-08-14 MED ORDER — GUAIFENESIN 100 MG/5ML PO SOLN
5.0000 mL | Freq: Four times a day (QID) | ORAL | Status: DC
  Administered 2016-08-14 – 2016-08-18 (×16): 100 mg
  Filled 2016-08-14: qty 5
  Filled 2016-08-14 (×2): qty 10
  Filled 2016-08-14 (×4): qty 5
  Filled 2016-08-14 (×3): qty 10
  Filled 2016-08-14: qty 5
  Filled 2016-08-14: qty 10
  Filled 2016-08-14 (×3): qty 5
  Filled 2016-08-14: qty 10
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
## 2016-08-14 NOTE — Progress Notes (Signed)
pts HR 130" for about 1 hour. Notified md. Awaiting orders. Pt resting comfortably. bp 160/98, o2 sats 98 on trach collar. Family at bedside.
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
## 2016-08-15 LAB — GLUCOSE, CAPILLARY
GLUCOSE-CAPILLARY: 143 mg/dL — AB (ref 65–99)
GLUCOSE-CAPILLARY: 124 mg/dL — AB (ref 65–99)
Glucose-Capillary: 119 mg/dL — ABNORMAL HIGH (ref 65–99)
Glucose-Capillary: 129 mg/dL — ABNORMAL HIGH (ref 65–99)
Glucose-Capillary: 144 mg/dL — ABNORMAL HIGH (ref 65–99)
Glucose-Capillary: 89 mg/dL (ref 65–99)

## 2016-08-15 LAB — CBC
HEMATOCRIT: 31.5 % — AB (ref 39.0–52.0)
HEMOGLOBIN: 10 g/dL — AB (ref 13.0–17.0)
MCH: 30 pg (ref 26.0–34.0)
MCHC: 31.7 g/dL (ref 30.0–36.0)
MCV: 94.6 fL (ref 78.0–100.0)
PLATELETS: 567 10*3/uL — AB (ref 150–400)
RBC: 3.33 MIL/uL — AB (ref 4.22–5.81)
RDW: 16.2 % — ABNORMAL HIGH (ref 11.5–15.5)
WBC: 13 10*3/uL — ABNORMAL HIGH (ref 4.0–10.5)

## 2016-08-15 LAB — BASIC METABOLIC PANEL
Anion gap: 8 (ref 5–15)
BUN: 14 mg/dL (ref 6–20)
CHLORIDE: 102 mmol/L (ref 101–111)
CO2: 26 mmol/L (ref 22–32)
Calcium: 9.3 mg/dL (ref 8.9–10.3)
Creatinine, Ser: 0.64 mg/dL (ref 0.61–1.24)
GFR calc Af Amer: >60 mL/min (ref >60)
GFR calc non Af Amer: >60 mL/min (ref >60)
GLUCOSE: 111 mg/dL — AB (ref 65–99)
POTASSIUM: 4.1 mmol/L (ref 3.5–5.1)
Sodium: 136 mmol/L (ref 135–145)

## 2016-08-15 MED ORDER — CLONAZEPAM 0.5 MG PO TABS
0.5000 mg | ORAL_TABLET | Freq: Two times a day (BID) | ORAL | Status: DC
  Administered 2016-08-15: 0.5 mg
  Filled 2016-08-15: qty 1
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
## 2016-08-16 LAB — GLUCOSE, CAPILLARY
GLUCOSE-CAPILLARY: 115 mg/dL — AB (ref 65–99)
GLUCOSE-CAPILLARY: 121 mg/dL — AB (ref 65–99)
GLUCOSE-CAPILLARY: 94 mg/dL (ref 65–99)
Glucose-Capillary: 101 mg/dL — ABNORMAL HIGH (ref 65–99)
Glucose-Capillary: 117 mg/dL — ABNORMAL HIGH (ref 65–99)
Glucose-Capillary: 120 mg/dL — ABNORMAL HIGH (ref 65–99)
Glucose-Capillary: 126 mg/dL — ABNORMAL HIGH (ref 65–99)

## 2016-08-16 MED ORDER — QUETIAPINE FUMARATE 50 MG PO TABS
200.0000 mg | ORAL_TABLET | Freq: Every day | ORAL | Status: DC
  Administered 2016-08-16 – 2016-08-17 (×2): 200 mg
  Filled 2016-08-16 (×2): qty 4
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
## 2016-08-16 NOTE — Progress Notes (Addendum)
Pt transferred to 5c04 per MD order, report called to receiving nurse and all questions answered. Family aware of transfer and all belongings sent to new room.
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
## 2016-08-17 DIAGNOSIS — S069X3S Unspecified intracranial injury with loss of consciousness of 1 hour to 5 hours 59 minutes, sequela: Secondary | ICD-10-CM

## 2016-08-17 LAB — GLUCOSE, CAPILLARY
GLUCOSE-CAPILLARY: 103 mg/dL — AB (ref 65–99)
GLUCOSE-CAPILLARY: 112 mg/dL — AB (ref 65–99)
GLUCOSE-CAPILLARY: 94 mg/dL (ref 65–99)
Glucose-Capillary: 122 mg/dL — ABNORMAL HIGH (ref 65–99)
Glucose-Capillary: 127 mg/dL — ABNORMAL HIGH (ref 65–99)
Glucose-Capillary: 131 mg/dL — ABNORMAL HIGH (ref 65–99)

## 2016-08-17 LAB — CBC
HCT: 33.9 % — ABNORMAL LOW (ref 39.0–52.0)
Hemoglobin: 10.4 g/dL — ABNORMAL LOW (ref 13.0–17.0)
MCH: 29.4 pg (ref 26.0–34.0)
MCHC: 30.7 g/dL (ref 30.0–36.0)
MCV: 95.8 fL (ref 78.0–100.0)
PLATELETS: 484 10*3/uL — AB (ref 150–400)
RBC: 3.54 MIL/uL — AB (ref 4.22–5.81)
RDW: 15.8 % — ABNORMAL HIGH (ref 11.5–15.5)
WBC: 9.6 10*3/uL (ref 4.0–10.5)

## 2016-08-17 LAB — BASIC METABOLIC PANEL
Anion gap: 10 (ref 5–15)
BUN: 17 mg/dL (ref 6–20)
CHLORIDE: 103 mmol/L (ref 101–111)
CO2: 27 mmol/L (ref 22–32)
CREATININE: 0.7 mg/dL (ref 0.61–1.24)
Calcium: 9.4 mg/dL (ref 8.9–10.3)
GFR calc Af Amer: >60 mL/min (ref >60)
GLUCOSE: 145 mg/dL — AB (ref 65–99)
POTASSIUM: 3.6 mmol/L (ref 3.5–5.1)
Sodium: 140 mmol/L (ref 135–145)

## 2016-08-17 MED ORDER — GLUCERNA 1.2 CAL PO LIQD
1000.0000 mL | ORAL | Status: DC
  Administered 2016-08-18: 1000 mL
  Filled 2016-08-17 (×2): qty 1000

## 2016-08-17 NOTE — Progress Notes (Signed)
Inpatient Rehabilitation  Met with patient and fiancee, Primitivo Gauze to discuss team's recommendation for IP Rehab.  Shared booklets and answered questions.  I also called and spoke with patient's mother, Levada Dy.  Levada Dy is interested in [place] for her son and reports she has already initiated his Medicaid and disability applications.  Plan to follow along for timing of medical readiness and bed availability.     Carmelia Roller., CCC/SLP Admission Coordinator  [place]  Cell [phone]
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
## 2016-08-17 NOTE — Progress Notes (Signed)
Patient was able to grab trach cannula and pull slightly away from his skin. Cannula has since returned to position and moves with patient cough. Because it was recently resized, notified Dr. Lindie SpruceWyatt who states should be fine. Will continue to monitor. Lawson RadarHeather M Jozelynn Danielson
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
## 2016-08-17 NOTE — Progress Notes (Signed)
Tracheostomy tube downsized to 6 cuffless at bedside without complications. Some bleeding as expected after removal of size 8. Oxygen saturations were 98% after new trach placed.    Hosie SpangleElizabeth Chane Cowden, PA-C [facility] Pager: [phone] Consults: [phone] Mon-Fri 7:00 am-4:30 pm Sat-Sun 7:00 am-11:30 am
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
## 2016-08-18 ENCOUNTER — Encounter (HOSPITAL_COMMUNITY): Payer: Self-pay | Admitting: *Deleted

## 2016-08-18 ENCOUNTER — Encounter (HOSPITAL_COMMUNITY): Payer: Self-pay | Admitting: Emergency Medicine

## 2016-08-18 ENCOUNTER — Inpatient Hospital Stay (HOSPITAL_COMMUNITY)
Admission: RE | Admit: 2016-08-18 | Discharge: 2016-09-19 | DRG: 949 | Disposition: A | Discharge status: Home / Self Care | Payer: Medicaid Other | Source: Intra-hospital | Attending: Physical Medicine & Rehabilitation | Admitting: Physical Medicine & Rehabilitation

## 2016-08-18 DIAGNOSIS — R451 Restlessness and agitation: Secondary | ICD-10-CM | POA: Diagnosis not present

## 2016-08-18 DIAGNOSIS — I159 Secondary hypertension, unspecified: Secondary | ICD-10-CM

## 2016-08-18 DIAGNOSIS — I10 Essential (primary) hypertension: Secondary | ICD-10-CM | POA: Diagnosis present

## 2016-08-18 DIAGNOSIS — S069X0S Unspecified intracranial injury without loss of consciousness, sequela: Secondary | ICD-10-CM

## 2016-08-18 DIAGNOSIS — S069X3S Unspecified intracranial injury with loss of consciousness of 1 hour to 5 hours 59 minutes, sequela: Secondary | ICD-10-CM

## 2016-08-18 DIAGNOSIS — F1210 Cannabis abuse, uncomplicated: Secondary | ICD-10-CM | POA: Diagnosis present

## 2016-08-18 DIAGNOSIS — R739 Hyperglycemia, unspecified: Secondary | ICD-10-CM | POA: Diagnosis present

## 2016-08-18 DIAGNOSIS — S06303S Unspecified focal traumatic brain injury with loss of consciousness of 1 hour to 5 hours 59 minutes, sequela: Secondary | ICD-10-CM

## 2016-08-18 DIAGNOSIS — G479 Sleep disorder, unspecified: Secondary | ICD-10-CM | POA: Diagnosis present

## 2016-08-18 DIAGNOSIS — Z298 Encounter for other specified prophylactic measures: Secondary | ICD-10-CM

## 2016-08-18 DIAGNOSIS — R4189 Other symptoms and signs involving cognitive functions and awareness: Secondary | ICD-10-CM | POA: Diagnosis present

## 2016-08-18 DIAGNOSIS — M7989 Other specified soft tissue disorders: Secondary | ICD-10-CM | POA: Diagnosis not present

## 2016-08-18 DIAGNOSIS — J45909 Unspecified asthma, uncomplicated: Secondary | ICD-10-CM | POA: Diagnosis present

## 2016-08-18 DIAGNOSIS — R339 Retention of urine, unspecified: Secondary | ICD-10-CM | POA: Diagnosis present

## 2016-08-18 DIAGNOSIS — S062X3S Diffuse traumatic brain injury with loss of consciousness of 1 hour to 5 hours 59 minutes, sequela: Secondary | ICD-10-CM | POA: Diagnosis not present

## 2016-08-18 DIAGNOSIS — S06369D Traumatic hemorrhage of cerebrum, unspecified, with loss of consciousness of unspecified duration, subsequent encounter: Principal | ICD-10-CM

## 2016-08-18 DIAGNOSIS — S0101XD Laceration without foreign body of scalp, subsequent encounter: Secondary | ICD-10-CM

## 2016-08-18 DIAGNOSIS — S069X9A Unspecified intracranial injury with loss of consciousness of unspecified duration, initial encounter: Secondary | ICD-10-CM | POA: Diagnosis present

## 2016-08-18 DIAGNOSIS — Z931 Gastrostomy status: Secondary | ICD-10-CM | POA: Diagnosis not present

## 2016-08-18 DIAGNOSIS — Z930 Tracheostomy status: Secondary | ICD-10-CM

## 2016-08-18 DIAGNOSIS — R0689 Other abnormalities of breathing: Secondary | ICD-10-CM

## 2016-08-18 DIAGNOSIS — F068 Other specified mental disorders due to known physiological condition: Secondary | ICD-10-CM

## 2016-08-18 DIAGNOSIS — R1310 Dysphagia, unspecified: Secondary | ICD-10-CM

## 2016-08-18 DIAGNOSIS — R1312 Dysphagia, oropharyngeal phase: Secondary | ICD-10-CM | POA: Diagnosis not present

## 2016-08-18 DIAGNOSIS — S069XAA Unspecified intracranial injury with loss of consciousness status unknown, initial encounter: Secondary | ICD-10-CM | POA: Diagnosis present

## 2016-08-18 DIAGNOSIS — I158 Other secondary hypertension: Secondary | ICD-10-CM | POA: Diagnosis not present

## 2016-08-18 DIAGNOSIS — D62 Acute posthemorrhagic anemia: Secondary | ICD-10-CM | POA: Diagnosis present

## 2016-08-18 LAB — GLUCOSE, CAPILLARY
GLUCOSE-CAPILLARY: 129 mg/dL — AB (ref 65–99)
GLUCOSE-CAPILLARY: 121 mg/dL — AB (ref 65–99)
GLUCOSE-CAPILLARY: 136 mg/dL — AB (ref 65–99)

## 2016-08-18 LAB — CBC
HCT: 35.4 % — ABNORMAL LOW (ref 39.0–52.0)
HEMOGLOBIN: 11.2 g/dL — AB (ref 13.0–17.0)
MCH: 29.9 pg (ref 26.0–34.0)
MCHC: 31.6 g/dL (ref 30.0–36.0)
MCV: 94.7 fL (ref 78.0–100.0)
PLATELETS: 433 10*3/uL — AB (ref 150–400)
RBC: 3.74 MIL/uL — AB (ref 4.22–5.81)
RDW: 15.7 % — ABNORMAL HIGH (ref 11.5–15.5)
WBC: 8.4 10*3/uL (ref 4.0–10.5)

## 2016-08-18 LAB — CREATININE, SERUM: CREATININE: 0.72 mg/dL (ref 0.61–1.24)

## 2016-08-18 MED ORDER — LORAZEPAM 2 MG/ML IJ SOLN: 1.0000 mg | INTRAMUSCULAR | Status: DC | PRN

## 2016-08-18 MED ORDER — ACETAMINOPHEN 160 MG/5ML PO SOLN
650.0000 mg | Freq: Four times a day (QID) | ORAL | Status: DC | PRN
  Administered 2016-08-20 – 2016-09-11 (×2): 650 mg
  Filled 2016-08-18 (×2): qty 20.3

## 2016-08-18 MED ORDER — HYDROCODONE-ACETAMINOPHEN 7.5-325 MG/15ML PO SOLN
15.0000 mL | ORAL | Status: DC | PRN
Start: 2016-08-18 — End: 2016-09-19
  Administered 2016-08-19 – 2016-09-13 (×24): 15 mL
  Filled 2016-08-18 (×27): qty 15

## 2016-08-18 MED ORDER — PRO-STAT SUGAR FREE PO LIQD
30.0000 mL | Freq: Every day | ORAL | Status: DC
  Administered 2016-08-18 – 2016-08-21 (×15): 30 mL
  Filled 2016-08-18 (×15): qty 30

## 2016-08-18 MED ORDER — FREE WATER
150.0000 mL | Freq: Two times a day (BID) | Status: DC
  Administered 2016-08-18 – 2016-08-24 (×12): 150 mL

## 2016-08-18 MED ORDER — ENOXAPARIN SODIUM 30 MG/0.3ML ~~LOC~~ SOLN: 30.0000 mg | Freq: Two times a day (BID) | SUBCUTANEOUS | Status: DC

## 2016-08-18 MED ORDER — METOPROLOL TARTRATE 25 MG/10 ML ORAL SUSPENSION
100.0000 mg | Freq: Three times a day (TID) | ORAL | Status: DC
  Administered 2016-08-18 – 2016-08-23 (×14): 100 mg
  Administered 2016-08-23: 25 mg
  Administered 2016-08-23 – 2016-09-03 (×27): 100 mg
  Administered 2016-09-03: 50 mg
  Administered 2016-09-04 – 2016-09-10 (×18): 100 mg
  Filled 2016-08-18 (×71): qty 40

## 2016-08-18 MED ORDER — IPRATROPIUM BROMIDE 0.02 % IN SOLN
0.5000 mg | Freq: Three times a day (TID) | RESPIRATORY_TRACT | Status: DC
  Administered 2016-08-18 – 2016-08-22 (×11): 0.5 mg via RESPIRATORY_TRACT
  Filled 2016-08-18 (×13): qty 2.5

## 2016-08-18 MED ORDER — INSULIN ASPART 100 UNIT/ML ~~LOC~~ SOLN
0.0000 [IU] | SUBCUTANEOUS | Status: DC
Start: 2016-08-18 — End: 2016-09-09
  Administered 2016-08-18 – 2016-08-23 (×13): 2 [IU] via SUBCUTANEOUS
  Administered 2016-08-23: 3 [IU] via SUBCUTANEOUS
  Administered 2016-08-24: 2 [IU] via SUBCUTANEOUS
  Administered 2016-08-24: 3 [IU] via SUBCUTANEOUS
  Administered 2016-08-25 – 2016-08-27 (×6): 2 [IU] via SUBCUTANEOUS
  Administered 2016-08-27 – 2016-08-28 (×2): 3 [IU] via SUBCUTANEOUS
  Administered 2016-08-28 (×2): 2 [IU] via SUBCUTANEOUS
  Administered 2016-08-29: 3 [IU] via SUBCUTANEOUS
  Administered 2016-08-30: 2 [IU] via SUBCUTANEOUS
  Administered 2016-08-30: 3 [IU] via SUBCUTANEOUS
  Administered 2016-08-30 – 2016-08-31 (×4): 2 [IU] via SUBCUTANEOUS
  Administered 2016-08-31: 3 [IU] via SUBCUTANEOUS
  Administered 2016-09-02: 5 [IU] via SUBCUTANEOUS
  Administered 2016-09-03: 2 [IU] via SUBCUTANEOUS
  Administered 2016-09-04: 3 [IU] via SUBCUTANEOUS
  Administered 2016-09-04 – 2016-09-06 (×5): 2 [IU] via SUBCUTANEOUS
  Administered 2016-09-07: 3 [IU] via SUBCUTANEOUS
  Administered 2016-09-07: 2 [IU] via SUBCUTANEOUS
  Administered 2016-09-08: 3 [IU] via SUBCUTANEOUS
  Administered 2016-09-09 (×2): 2 [IU] via SUBCUTANEOUS

## 2016-08-18 MED ORDER — PANTOPRAZOLE SODIUM 40 MG PO PACK
40.0000 mg | PACK | Freq: Every day | ORAL | Status: DC
  Administered 2016-08-18 – 2016-09-15 (×29): 40 mg
  Filled 2016-08-18 (×26): qty 20

## 2016-08-18 MED ORDER — LORAZEPAM 2 MG/ML IJ SOLN
1.0000 mg | INTRAMUSCULAR | Status: DC | PRN
  Administered 2016-08-18: 2 mg via INTRAVENOUS
  Administered 2016-08-19: 1 mg via INTRAVENOUS
  Administered 2016-08-19 – 2016-08-21 (×4): 2 mg via INTRAVENOUS
  Filled 2016-08-18 (×6): qty 1

## 2016-08-18 MED ORDER — LEVETIRACETAM 100 MG/ML PO SOLN
500.0000 mg | Freq: Two times a day (BID) | ORAL | Status: DC
  Administered 2016-08-18 – 2016-08-29 (×22): 500 mg
  Filled 2016-08-18 (×23): qty 5

## 2016-08-18 MED ORDER — HYDROCORTISONE 1 % EX CREA
TOPICAL_CREAM | Freq: Every day | CUTANEOUS | Status: DC | PRN
  Filled 2016-08-18: qty 28

## 2016-08-18 MED ORDER — ENOXAPARIN SODIUM 30 MG/0.3ML ~~LOC~~ SOLN
30.0000 mg | Freq: Two times a day (BID) | SUBCUTANEOUS | Status: DC
Start: 2016-08-18 — End: 2016-09-19
  Administered 2016-08-19 – 2016-09-19 (×62): 30 mg via SUBCUTANEOUS
  Filled 2016-08-18 (×65): qty 0.3

## 2016-08-18 MED ORDER — BETHANECHOL CHLORIDE 25 MG PO TABS
25.0000 mg | ORAL_TABLET | Freq: Four times a day (QID) | ORAL | Status: DC
  Administered 2016-08-18 – 2016-08-23 (×19): 25 mg
  Filled 2016-08-18 (×19): qty 1

## 2016-08-18 MED ORDER — LORAZEPAM 2 MG/ML IJ SOLN
1.0000 mg | INTRAMUSCULAR | Status: DC | PRN
  Filled 2016-08-18: qty 1

## 2016-08-18 MED ORDER — QUETIAPINE FUMARATE 100 MG PO TABS
200.0000 mg | ORAL_TABLET | Freq: Every day | ORAL | Status: DC
  Administered 2016-08-18 – 2016-08-23 (×6): 200 mg
  Filled 2016-08-18 (×6): qty 2

## 2016-08-18 MED ORDER — LEVALBUTEROL HCL 0.63 MG/3ML IN NEBU
0.6300 mg | INHALATION_SOLUTION | Freq: Three times a day (TID) | RESPIRATORY_TRACT | Status: DC
  Administered 2016-08-18 – 2016-08-22 (×11): 0.63 mg via RESPIRATORY_TRACT
  Filled 2016-08-18 (×13): qty 3

## 2016-08-18 MED ORDER — GLUCERNA 1.2 CAL PO LIQD
1000.0000 mL | ORAL | Status: DC
  Administered 2016-08-19: 1000 mL
  Filled 2016-08-18 (×2): qty 1000

## 2016-08-18 MED ORDER — INSULIN GLARGINE 100 UNIT/ML ~~LOC~~ SOLN
10.0000 [IU] | Freq: Two times a day (BID) | SUBCUTANEOUS | Status: DC
Start: 2016-08-18 — End: 2016-09-04
  Administered 2016-08-18 – 2016-09-03 (×31): 10 [IU] via SUBCUTANEOUS
  Filled 2016-08-18 (×37): qty 0.1
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
## 2016-08-18 NOTE — Progress Notes (Signed)
Pt arrived to unit. Pt is alert and pt on 28% O2 via trach collar and o2 saturations are 98. Pt has mother at bedside. Pt settled into unit. Continue plan of care.

## 2016-08-18 NOTE — Care Management Note (Signed)
Case Management Note  Patient Details  Name: [name] MRN: [id number] Date of Birth: [birth year]  Subjective/Objective:                    Action/Plan: Pt discharging to CIR today. No further needs per CM.   Expected Discharge Date:                  Expected Discharge Plan:  IP Rehab Facility  In-House Referral:     Discharge planning Services  CM Consult  Post Acute Care Choice:    Choice offered to:     DME Arranged:    DME Agency:     HH Arranged:    HH Agency:     Status of Service:  Completed, signed off  If discussed at [employer] Length of Stay Meetings, dates discussed:    Additional Comments:  Kermit BaloKelli F Deette Revak, RN 08/18/2016, 12:41 PM

## 2016-08-18 NOTE — Progress Notes (Signed)
Inpatient Rehabilitation  Received medical clearance for IP Rehab admission, have a bed available, and plan to proceed with admitting patient today.  Please call with questions.  Charlane FerrettiMelissa Sanford Lindblad, M.A., CCC/SLP Admission Coordinator  [facility]  Cell [phone]
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
## 2016-08-19 ENCOUNTER — Inpatient Hospital Stay (HOSPITAL_COMMUNITY): Payer: Self-pay | Admitting: Physical Therapy

## 2016-08-19 ENCOUNTER — Inpatient Hospital Stay (HOSPITAL_COMMUNITY): Payer: Medicaid Other

## 2016-08-19 ENCOUNTER — Inpatient Hospital Stay (HOSPITAL_COMMUNITY): Payer: Medicaid Other | Admitting: Speech Pathology

## 2016-08-19 MED ORDER — CLONIDINE HCL 0.1 MG PO TABS: 0.1000 mg | ORAL_TABLET | Freq: Three times a day (TID) | ORAL | Status: DC | PRN

## 2016-08-19 MED ORDER — GLUCERNA 1.2 CAL PO LIQD
1000.0000 mL | ORAL | Status: DC
  Administered 2016-08-19 – 2016-08-22 (×4): 1000 mL
  Filled 2016-08-19 (×12): qty 1000
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
## 2016-08-20 ENCOUNTER — Inpatient Hospital Stay (HOSPITAL_COMMUNITY): Payer: Medicaid Other

## 2016-08-20 DIAGNOSIS — M7989 Other specified soft tissue disorders: Secondary | ICD-10-CM

## 2016-08-20 MED ORDER — ORAL CARE MOUTH RINSE
15.0000 mL | Freq: Two times a day (BID) | OROMUCOSAL | Status: DC
  Administered 2016-08-20 – 2016-09-18 (×50): 15 mL via OROMUCOSAL
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
## 2016-08-20 NOTE — Progress Notes (Signed)
**  Preliminary report by tech**  Bilateral lower extremity venous duplex completed. There is no evidence of deep or superficial vein thrombosis involving the right and left lower extremities. All visualized vessels appear patent and compressible. There is no evidence of Baker's cysts bilaterally.  08/20/16 2:31 PM Olen CordialGreg Darvin Dials RVT

## 2016-08-21 ENCOUNTER — Inpatient Hospital Stay (HOSPITAL_COMMUNITY): Payer: Medicaid Other | Admitting: Physical Therapy

## 2016-08-21 ENCOUNTER — Inpatient Hospital Stay (HOSPITAL_COMMUNITY): Payer: Medicaid Other | Admitting: Speech Pathology

## 2016-08-21 ENCOUNTER — Inpatient Hospital Stay (HOSPITAL_COMMUNITY): Payer: Self-pay

## 2016-08-21 ENCOUNTER — Inpatient Hospital Stay (HOSPITAL_COMMUNITY): Payer: Medicaid Other | Admitting: Occupational Therapy

## 2016-08-21 LAB — GLUCOSE, CAPILLARY
GLUCOSE-CAPILLARY: 100 mg/dL — AB (ref 65–99)
GLUCOSE-CAPILLARY: 106 mg/dL — AB (ref 65–99)
GLUCOSE-CAPILLARY: 107 mg/dL — AB (ref 65–99)
GLUCOSE-CAPILLARY: 121 mg/dL — AB (ref 65–99)
GLUCOSE-CAPILLARY: 122 mg/dL — AB (ref 65–99)
GLUCOSE-CAPILLARY: 131 mg/dL — AB (ref 65–99)
GLUCOSE-CAPILLARY: 138 mg/dL — AB (ref 65–99)
GLUCOSE-CAPILLARY: 141 mg/dL — AB (ref 65–99)
GLUCOSE-CAPILLARY: 88 mg/dL (ref 65–99)
GLUCOSE-CAPILLARY: 97 mg/dL (ref 65–99)
Glucose-Capillary: 118 mg/dL — ABNORMAL HIGH (ref 65–99)
Glucose-Capillary: 123 mg/dL — ABNORMAL HIGH (ref 65–99)
Glucose-Capillary: 125 mg/dL — ABNORMAL HIGH (ref 65–99)
Glucose-Capillary: 129 mg/dL — ABNORMAL HIGH (ref 65–99)
Glucose-Capillary: 129 mg/dL — ABNORMAL HIGH (ref 65–99)
Glucose-Capillary: 130 mg/dL — ABNORMAL HIGH (ref 65–99)
Glucose-Capillary: 165 mg/dL — ABNORMAL HIGH (ref 65–99)
Glucose-Capillary: 99 mg/dL (ref 65–99)
Glucose-Capillary: 99 mg/dL (ref 65–99)
Glucose-Capillary: 96 mg/dL (ref 65–99)
Glucose-Capillary: 106 mg/dL — ABNORMAL HIGH (ref 65–99)

## 2016-08-21 LAB — COMPREHENSIVE METABOLIC PANEL
ALBUMIN: 3 g/dL — AB (ref 3.5–5.0)
ALK PHOS: 85 U/L (ref 38–126)
ALT: 24 U/L (ref 17–63)
ANION GAP: 6 (ref 5–15)
AST: 26 U/L (ref 15–41)
BILIRUBIN TOTAL: 0.8 mg/dL (ref 0.3–1.2)
BUN: 12 mg/dL (ref 6–20)
CALCIUM: 9.5 mg/dL (ref 8.9–10.3)
CO2: 31 mmol/L (ref 22–32)
Chloride: 98 mmol/L — ABNORMAL LOW (ref 101–111)
Creatinine, Ser: 0.67 mg/dL (ref 0.61–1.24)
GFR calc Af Amer: >60 mL/min (ref >60)
GFR calc non Af Amer: >60 mL/min (ref >60)
GLUCOSE: 127 mg/dL — AB (ref 65–99)
Potassium: 4 mmol/L (ref 3.5–5.1)
Sodium: 135 mmol/L (ref 135–145)
TOTAL PROTEIN: 7.5 g/dL (ref 6.5–8.1)

## 2016-08-21 LAB — CBC WITH DIFFERENTIAL/PLATELET
BASOS PCT: 1 %
Basophils Absolute: 0 10*3/uL (ref 0.0–0.1)
Eosinophils Absolute: 0.3 10*3/uL (ref 0.0–0.7)
Eosinophils Relative: 5 %
HEMATOCRIT: 34.3 % — AB (ref 39.0–52.0)
HEMOGLOBIN: 10.9 g/dL — AB (ref 13.0–17.0)
LYMPHS ABS: 1.7 10*3/uL (ref 0.7–4.0)
LYMPHS PCT: 29 %
MCH: 30.4 pg (ref 26.0–34.0)
MCHC: 31.8 g/dL (ref 30.0–36.0)
MCV: 95.5 fL (ref 78.0–100.0)
MONOS PCT: 9 %
Monocytes Absolute: 0.6 10*3/uL (ref 0.1–1.0)
NEUTROS ABS: 3.5 10*3/uL (ref 1.7–7.7)
NEUTROS PCT: 56 %
Platelets: 337 10*3/uL (ref 150–400)
RBC: 3.59 MIL/uL — ABNORMAL LOW (ref 4.22–5.81)
RDW: 15.2 % (ref 11.5–15.5)
WBC: 6.1 10*3/uL (ref 4.0–10.5)

## 2016-08-21 MED ORDER — PRO-STAT SUGAR FREE PO LIQD
30.0000 mL | Freq: Four times a day (QID) | ORAL | Status: DC
  Administered 2016-08-21 – 2016-08-24 (×11): 30 mL
  Filled 2016-08-21 (×11): qty 30

## 2016-08-21 MED ORDER — LORAZEPAM 2 MG/ML IJ SOLN
0.5000 mg | Freq: Four times a day (QID) | INTRAMUSCULAR | Status: DC | PRN
  Filled 2016-08-21: qty 1

## 2016-08-21 NOTE — Procedures (Signed)
Tracheostomy Change Note  Patient Details:   Name: [name] DOB: Dec 13, 1980 MRN: [id number]    Airway Documentation:     Evaluation  O2 sats: stable throughout Complications: No apparent complications Patient did tolerate procedure well. Bilateral Breath Sounds: Clear, Diminished    Georgeanna LeaMakhlouf, Cannie Muckle Elias 08/21/2016, 11:14 AM

## 2016-08-21 NOTE — Progress Notes (Signed)
Occupational Therapy Session Note  Patient Details  Name: [name] MRN: [id number] Date of Birth: Feb 21, 1981  Today's Date: 08/21/2016 OT Individual Time: [id number] OT Individual Time Calculation (min): 59 min    Short Term Goals: Week 1:  OT Short Term Goal 1 (Week 1): Patient will complete transfer from EOB to w/c in prep for BADL with mod A X 1 OT Short Term Goal 2 (Week 1): Pt will complete 2 of 5 grooming tasks with setup, min vc and hand guidance to initiate task. OT Short Term Goal 3 (Week 1): Pt will maintian static stanidng balance with min assist while assisted with lower body dressing task with  OT Short Term Goal 4 (Week 1): Pt will participate in select activity, sustaining focused attention for 5 minutes during functional task.  Skilled Therapeutic Interventions/Progress Updates:    Pt asleep in bed upon arrival.  Focus on BADL retraining with emphasis on following one step commands, task initiation, sequencing, bed mobility, sit<>stand, standing balance, and functional transfers.  Pt did not open his eyes or attempt to verbally respond throughout session.  Pt required max A for supine>sit EOB.  Pt sat EOB with steady A to participate in bathing/dressing tasks.  Pt attempted to wash face after HOH A but did not complete task.  Pt required tot A for all bathing/dressing tasks this morning.  Pt required tot A + 2 for sit<>stand and min A for standing balance for LB bathing/dressing tasks.  Pt pulled his condom cath off but otherwise did not initiate any functional tasks.  Pt required tot A + 2 for stand pivot transfer to w/c.  Pt remained in TIS w/c with OTA until relieved by SLP.   Therapy Documentation Precautions:  Precautions Precautions: Fall Precaution Comments: trach and peg, trach collar vs vent  Restrictions Weight Bearing Restrictions: No Pain: Pain Assessment Pain Assessment: Faces Faces Pain Scale: No hurt  See Function Navigator for Current Functional  Status.   Therapy/Group: Individual Therapy  Rich BraveLanier, Juliann Olesky Chappell 08/21/2016, 10:00 AM

## 2016-08-21 NOTE — Progress Notes (Signed)
Occupational Therapy Session Note  Patient Details  Name: [name] MRN: [id number] Date of Birth: 1981/01/27  Today's Date: 08/21/2016 OT Individual Time:  - 0755-830   (35 min)      Short Term Goals: Week 1:  OT Short Term Goal 1 (Week 1): Patient will complete transfer from EOB to w/c in prep for BADL with mod A X 1 OT Short Term Goal 2 (Week 1): Pt will complete 2 of 5 grooming tasks with setup, min vc and hand guidance to initiate task. OT Short Term Goal 3 (Week 1): Pt will maintian static stanidng balance with min assist while assisted with lower body dressing task with  OT Short Term Goal 4 (Week 1): Pt will participate in select activity, sustaining focused attention for 5 minutes during functional task. Week 2:     Skilled Therapeutic Interventions/Progress Updates:    Pt lying in bed with eyes closed.  Performed PROM to alll extremitieswhile orienting pt to time, place and situation. Pt rolled from back to side during treatment x2.  Peformed face washing with pt not engaged or focused on activity.  Nursing reported 2 meds given at 4 am which contributed to sleepiness and lethargy.  Repositioned pt in bed before leaving.   .    Therapy Documentation Precautions:  Precautions Precautions: Fall, Cervical Precaution Comments: trach and peg, trach collar vs vent  Restrictions Weight Bearing Restrictions: No General:   Vital Signs: Therapy Vitals Temp: 98.3 F (36.8 C) Temp Source: Axillary Pulse Rate: 88 Resp: 18 BP: 138/78 Patient Position (if appropriate): Lying Oxygen Therapy SpO2: 96 % O2 Device: Tracheostomy Collar O2 Flow Rate (L/min): 6 L/min FiO2 (%): 28 % Pain:  none   ADL: ADL ADL Comments: see Functional Assessment Tool     See Function Navigator for Current Functional Status.   Therapy/Group: Individual Therapy  [name], [name] 08/21/2016, 7:46 AM

## 2016-08-21 NOTE — Progress Notes (Signed)
Physical Therapy Session Note  Patient Details  Name: [name] T Dlugosz MRN: [id number] Date of Birth: 1981-03-07  Today's Date: 08/21/2016 PT Individual Time: [id number] PT Individual Time Calculation (min): 33 min   Short Term Goals: Week 1:  PT Short Term Goal 1 (Week 1): Pt will perform functional transfers with +1 assist PT Short Term Goal 2 (Week 1): Pt will consistently follow 1 step commands 50% of the time  Skilled Therapeutic Interventions/Progress Updates:    Pt seen to make up missed time from earlier today. Pt received in bed with mother present for session. Pt with eyes open and more alert. Therapist donned shorts max assist with pt able to lift leg to assist with max cuing. Pt transferred supine<>sitting EOB with max assist +1 with therapist providing assistance for weight shifting. Pt tolerated sitting EOM ~20 minutes without BUE support while attempting to engage in matching playing cards and sorting colored pegs. Pt able to shake head yes/no to correctly identify color of pegs but did not actively engage in activities. Pt required occasional cuing to not pull at lines/tubes. Pt beginning to fall asleep while sitting EOB and unable to follow one step commands. Pt assisted back to bed & left with BUE mittens & PRAFO boots donned, all needs within reach & bed alarm set.  Pt only required close supervision/steady assist for sitting balance.  Therapy Documentation Precautions:  Precautions Precautions: Fall Precaution Comments: trach and peg, trach collar  Restrictions Weight Bearing Restrictions: No   See Function Navigator for Current Functional Status.   Therapy/Group: Individual Therapy  Sandi MariscalVictoria M Tinsley Lomas 08/21/2016, 4:47 PM
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
## 2016-08-21 NOTE — Progress Notes (Signed)
Occupational Therapy Note  Patient Details  Name: Elmon ElseDarren T Mcquitty MRN: [id number] Date of Birth: [birth year]  Today's Date: 08/21/2016 OT Individual Time: 1300-1330 OT Individual Time Calculation (min): 30 min   Pt with no s/s of pain Individual Therapy  Pt resting in TIS w/c at nursing station.  Pt's eyes were initially open but pt did not engage with therapist when spoken to.  Pt transitioned to ADL apartment.  Attempted to engage pt in functional task of sorting flatware in kitchen.  Pt did not follow any commands during this session.  Pt's eyes would open for approx 15 seconds and close.  Pt did not respond to any questions during this session.  Pt returned to nursing station and remained in TIS w/c with QRB in place.    Lavone NeriLanier, Laqueta Bonaventura [facility] 08/21/2016, 2:56 PM
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
## 2016-08-21 NOTE — Progress Notes (Addendum)
Physical Therapy Session Note  Patient Details  Name: [name] MRN: [id number] Date of Birth: [birth year]  Today's Date: 08/21/2016 PT Individual Time: 1110-1150 and [id number] PT Individual Time Calculation (min): 40 min and 39 min  Short Term Goals: Week 1:  PT Short Term Goal 1 (Week 1): Pt will perform functional transfers with +1 assist PT Short Term Goal 2 (Week 1): Pt will consistently follow 1 step commands 50% of the time  Skilled Therapeutic Interventions/Progress Updates:    Treatment 1: Pt received in TIS w/c. Pt without c/o or behaviors demonstrating pain during session. Transported pt to ortho gym via w/c total assist. Pt transferred w/c>mat table via stand pivot and mat>w/c via squat pivot with +2 assist for initiation and weight shifting. Pt tolerated sitting on mat table ~20 minutes with close supervision for sitting balance. Pt with eyes closed throughout beginning of session but eventually able to open them after washing face with warm washcloth. Pt able to keep eyes open ~5 minutes then continued to close them for remainder of session. Attempted to engage pt in ball toss and sorting cards but pt unable to follow one step commands. Pt assisted back to TIS w/c & left at nurses station with BUE mittens & QRB donned. Pt missed 20 minutes of skilled PT 2/2 lethargy.  Treatment 2: Pt received asleep in TIS w/c. Transported pt to rehab gym and attempted to wake pt with verbal & tactile stimulation (touch & cold washcloth) but pt not awakened. Returned pt to room & assisted back to bed via squat pivot with +2 assist. Pt requires assistance to initiate movement and for weight shifting. Pt transferred to supine with +2 assist. Pt observed to be incontinent of bowel & bladder. Pt performed rolling L/R with total assist to initiate but once in sidelying pt able to maintain position with supervision. Therapist provided total assist for peri-hygiene. Pt able to intermittently open eyes but  unable to stay awake to engage with therapist. Therapist observed small open area on stomach & RN notified.  At end of session pt left in bed with BUE mittens donned and RN & NT present. Pt missed 21 minutes of skilled PT treatment 2/2 lethargy.  Addendum: Per verbal orders from MD pt no longer has cervical precautions.  Therapy Documentation Precautions:  Precautions Precautions: Fall, Precaution Comments: trach and peg, trach collar Restrictions Weight Bearing Restrictions: No   General: PT Amount of Missed Time (min): 20 Minutes + 21 minutes PT Missed Treatment Reason:  (lethargy)  Pain: Pain Assessment Pain Assessment: Faces Faces Pain Scale: No hurt   See Function Navigator for Current Functional Status.   Therapy/Group: Individual Therapy  Sandi MariscalVictoria M Saloni Lablanc 08/21/2016, 4:51 PM
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
## 2016-08-21 NOTE — Progress Notes (Signed)
Patient more awake now, still not opening eyes to interact.  Patient is more restless sitting in wheelchair and has nodded at times to interact with verbal stimuli.  Resting in wheelchair at nurses station until next therapy at 1100.  Dani Gobbleeardon, Bernadean Saling J, RN

## 2016-08-21 NOTE — Progress Notes (Signed)
trach changed to #4 cuff less  Shiley per MD's order. patient tolerated well,  SATS remained at 100% through trach change,  no bleeding from site, trach change done per respiratory protocol 2 RTs at bed side, good color change on ETCO2 detector, good BBS ausculted, HR 76 BPM, we'll continue to monitor patient.
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
## 2016-08-21 NOTE — Progress Notes (Signed)
Patient information reviewed and entered into eRehab system by Jerney Baksh, RN, CRRN, PPS Coordinator.  Information including medical coding and functional independence measure will be reviewed and updated through discharge.    

## 2016-08-21 NOTE — Progress Notes (Signed)
Patient appears to be very drowsy this morning, unable to get patient to respond to voice and only responds minimally to pain.  Patient received ativan and hycet at 0430 which may have contributed to this change in LOC.  Dr. Riley KillSwartz present rounding on patient, notified of meds given.  Recommended we only use sedating meds if necessary for patient safety.

## 2016-08-22 ENCOUNTER — Inpatient Hospital Stay (HOSPITAL_COMMUNITY): Payer: Medicaid Other | Admitting: Physical Therapy

## 2016-08-22 ENCOUNTER — Inpatient Hospital Stay (HOSPITAL_COMMUNITY): Payer: Self-pay | Admitting: *Deleted

## 2016-08-22 ENCOUNTER — Inpatient Hospital Stay (HOSPITAL_COMMUNITY): Payer: Self-pay | Admitting: Physical Therapy

## 2016-08-22 ENCOUNTER — Inpatient Hospital Stay (HOSPITAL_COMMUNITY): Payer: Medicaid Other | Admitting: Speech Pathology

## 2016-08-22 LAB — GLUCOSE, CAPILLARY
GLUCOSE-CAPILLARY: 95 mg/dL (ref 65–99)
GLUCOSE-CAPILLARY: 90 mg/dL (ref 65–99)
GLUCOSE-CAPILLARY: 109 mg/dL — AB (ref 65–99)
Glucose-Capillary: 101 mg/dL — ABNORMAL HIGH (ref 65–99)
Glucose-Capillary: 126 mg/dL — ABNORMAL HIGH (ref 65–99)
Glucose-Capillary: 135 mg/dL — ABNORMAL HIGH (ref 65–99)

## 2016-08-22 MED ORDER — IPRATROPIUM BROMIDE 0.02 % IN SOLN: 0.5000 mg | Freq: Four times a day (QID) | RESPIRATORY_TRACT | Status: DC | PRN

## 2016-08-22 MED ORDER — LEVALBUTEROL HCL 0.63 MG/3ML IN NEBU
0.6300 mg | INHALATION_SOLUTION | Freq: Four times a day (QID) | RESPIRATORY_TRACT | Status: DC | PRN
  Administered 2016-08-24: 0.63 mg via RESPIRATORY_TRACT
  Filled 2016-08-22 (×2): qty 3

## 2016-08-22 NOTE — Progress Notes (Signed)
Physical Therapy Note  Patient Details  Name: Elmon ElseDarren T Wangerin MRN: [id number] Date of Birth: 05-19-1981 Today's Date: 08/22/2016    Time: 1300-1330 30 minutes  1:1 No signs/symptoms of pain.  Bed mobility with mod A, increased time for initiation and motor planning. Pt able to sit edge of bed with supervision. Stand pivot transfer to w/c with increased time and multimodal cuing. Pt requires multiple attempts but able to stand with mod A. +2 for transfer for safety, mod A.  Pt requires extra time and cuing to perform stand to sit.  Attempted stair negotiation as automatic task. Pt internally distracted, unable to perform despite multiple attempts.  Pt continues with impaired motor planning, processing, initiation and attention limiting functional abilities.   Ebelyn Bohnet 08/22/2016, 1:38 PM

## 2016-08-22 NOTE — Progress Notes (Signed)
Received call that pt had removed trach.  RT x 2 responded.  #4 cuffless trach re-inserted and capped per MD order.  Placement confirmed via positive color change on CO2 detector.

## 2016-08-22 NOTE — Progress Notes (Signed)
Pt removed trach for the 4th time today.  Left trach out and applied bandage per Dr. Riley KillSwartz.  Pt not in distress and appears to be resting comfortably.  RN at bedside.
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
## 2016-08-22 NOTE — Progress Notes (Signed)
Physical Therapy Session Note  Patient Details  Name: [name] MRN: [id number] Date of Birth: 11-24-1980  Today's Date: 08/22/2016 PT Individual Time: 0800-0900 and [id number] PT Individual Time Calculation (min): 60 min and 55 min  Short Term Goals: Week 1:  PT Short Term Goal 1 (Week 1): Pt will perform functional transfers with +1 assist PT Short Term Goal 2 (Week 1): Pt will consistently follow 1 step commands 50% of the time  Skilled Therapeutic Interventions/Progress Updates:  Treatment 1: Pt received in bed. Pt required tactile cuing for initiation of supine>sit, manual facilitation to transfer BLE to EOB and assist to right trunk. Pt transferred bed>w/c via stand pivot with +2 assist and max multimodal cuing for weight shifting and cuing to step. Transported pt to rehab gym where condom catheter came off. Pt able to don clean L sock by placing ankle over knee with max cuing to attend to task; pt unable to don R sock. Pt ambulated 20 ft + 45 ft with +2 assist (3 muskateers) with pt demonstrating posterior lean, decreased step length & decreased step width. Therapist provided manual facilitation for weight shifting L/R and cuing to take steps. Pt tolerated sitting on EOM ~20 minutes with and without BUE support while attempting to engage in ball toss and kick ball; pt able to toss ball but with limited engagement in kicking or catching. Pt did not engage in identifying children in pictures in room. At end of session pt left sitting in TIS w/c with QRB donned at nurses station.  Treatment 2: Pt received in TIS w/c. Session focused on NMR, functional mobility, and cognition. Transported pt to rehab gym where pt ambulated 60 ft + 150 ft (gym>room) with +2 assist, 3 muskateers style (pt=50% effort). Pt requires cuing for upright posture, manual facilitation for weight shifting L/R, and cuing to take steps. Pt negotiated 12 steps (6" + 3") with B rails & +2 assist. Pt requires max verbal cuing  to take steps and physical assist to advance LE to descend stairs. Pt observed to be incontinent of bowel & bladder and after ambulating gym>room tolerated standing ~5 minutes with min assist +1 to allow rehab tech to perform peri-hygiene total assist. Returned to gym via w/c total assist for energy conservation and attempted to engage pt in shooting basketball but pt not willing to transfer out of w/c to mat table nor play while sitting in w/c. Pt able to nod head yes when asked if he was tired. Returned to room & assisted pt to bed; pt required assistance to initiate sit>supine. At end of session pt left in bed with all 4 rails up, bed alarm set, & B PRAFO boots donned. Pt's fiance [facility]([name]) present and adamant that pt's mittens stay off, reporting she plans to watch him; RN notified.  Pt did not attempt to verbally communicate during session. Pt required redirection x 1 time to not pull at trach collar.  Therapy Documentation Precautions:  Precautions Precautions: Falls, trach, PEG, NPO, abdominal binder, Teds Restrictions Weight Bearing Restrictions: No   Pain: Faces - no hurt.  See Function Navigator for Current Functional Status.   Therapy/Group: Individual Therapy  Sandi MariscalVictoria M Deaisa Page 08/22/2016, 5:14 PM
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
## 2016-08-22 NOTE — Progress Notes (Signed)
Occupational Therapy Session Note  Patient Details  Name: [name] T Fenn MRN: [id number] Date of Birth: [birth year]  Today's Date: 08/22/2016 OT Individual Time: 1100-1200 OT Individual Time Calculation (min): 60 min    Short Term Goals: Week 1:  OT Short Term Goal 1 (Week 1): Patient will complete transfer from EOB to w/c in prep for BADL with mod A X 1 OT Short Term Goal 2 (Week 1): Pt will complete 2 of 5 grooming tasks with setup, min vc and hand guidance to initiate task. OT Short Term Goal 3 (Week 1): Pt will maintian static stanidng balance with min assist while assisted with lower body dressing task with  OT Short Term Goal 4 (Week 1): Pt will participate in select activity, sustaining focused attention for 5 minutes during functional task.  Skilled Therapeutic Interventions/Progress Updates:    Pt resting in TIS w/c upon arrival.  Pt incontinent of bladder.  Pt performed stand pivot transfer to toilet with max A for sit<>stand, min A for standing balance, and max verbal cues for sequencing during transfer.  Pt initiated doffing and donning pants but required assistance to complete task. Pt performed sit<>stand X 5 with max A for task initiation and sit<>stand. Pt requires min A for standing balance with posterior lean noted.  Pt transitioned to ADL apartment.  Pt pulled his trach out while sitting in w/c and returned to room, RN notified, and RT attended to replace trach.  Pt returned to bed for RN to assess buttocks and heels for skin breakdown.  Pt required max a for stand pivot transfer to bed and max A for bed mobility.  Pt remained in bed with bed alarm activated. Focus on task initiation, sequencing, sit<>stand, standing balance, functional transfers, and bed mobility.   Therapy Documentation Precautions:  Precautions Precautions: Fall, Cervical Precaution Comments: trach and peg, trach collar vs vent  Restrictions Weight Bearing Restrictions: No   Pain:  Pt with no s/s of  pain ADL: ADL ADL Comments: see Functional Assessment Tool  See Function Navigator for Current Functional Status.   Therapy/Group: Individual Therapy  Rich BraveLanier, Jevon Shells Chappell 08/22/2016, 12:07 PM

## 2016-08-22 NOTE — Progress Notes (Signed)
Received call that pt had removed trach.  RT x 2 responded.  #4 cuffless trach re-inserted and capped per MD order.  Placement confirmed via positive color change on CO2 detector.   

## 2016-08-22 NOTE — Progress Notes (Signed)
Received call that pt had removed trach.  RT x 2 responded.  New #4 cuffless trach inserted and capped per MD order.  Placement confirmed via positive color change on CO2 detector.

## 2016-08-23 ENCOUNTER — Inpatient Hospital Stay (HOSPITAL_COMMUNITY): Payer: Self-pay

## 2016-08-23 ENCOUNTER — Inpatient Hospital Stay (HOSPITAL_COMMUNITY): Payer: Self-pay | Admitting: *Deleted

## 2016-08-23 ENCOUNTER — Inpatient Hospital Stay (HOSPITAL_COMMUNITY): Payer: Medicaid Other | Admitting: Speech Pathology

## 2016-08-23 ENCOUNTER — Inpatient Hospital Stay (HOSPITAL_COMMUNITY): Payer: Medicaid Other | Admitting: Physical Therapy

## 2016-08-23 LAB — GLUCOSE, CAPILLARY
GLUCOSE-CAPILLARY: 102 mg/dL — AB (ref 65–99)
GLUCOSE-CAPILLARY: 136 mg/dL — AB (ref 65–99)
GLUCOSE-CAPILLARY: 93 mg/dL (ref 65–99)
Glucose-Capillary: 101 mg/dL — ABNORMAL HIGH (ref 65–99)
Glucose-Capillary: 154 mg/dL — ABNORMAL HIGH (ref 65–99)

## 2016-08-23 MED ORDER — BETHANECHOL CHLORIDE 10 MG PO TABS
10.0000 mg | ORAL_TABLET | Freq: Four times a day (QID) | ORAL | Status: DC
  Administered 2016-08-23 – 2016-08-25 (×7): 10 mg
  Filled 2016-08-23 (×8): qty 1

## 2016-08-23 MED ORDER — METHYLPHENIDATE HCL 5 MG PO TABS
5.0000 mg | ORAL_TABLET | Freq: Two times a day (BID) | ORAL | Status: DC
  Administered 2016-08-24: 5 mg
  Filled 2016-08-23: qty 1

## 2016-08-23 NOTE — Progress Notes (Signed)
Physical Therapy Note  Patient Details  Name: [name] MRN: [id number] Date of Birth: 1980-07-20 Today's Date: 08/23/2016  1300-1330, 30 min individual tx Pain: no S/S of pain  Pt somnolent, occasionally opening L eye.  Pt restless, moving around in w/c, but compliant with requests.  Simulated car transfer with +2 for stand pivot. Extra time needed for initiation of movement.  Pt appearing exhausted.  Returned to bed via stand pivot to R; informed next therapist, [name], COTA.  Pt left resting in bed with all needs within reach.  See function navigator for current status. [name] 08/23/2016, 12:59 PM

## 2016-08-23 NOTE — Progress Notes (Signed)
Speech Language Pathology Daily Session Note  Patient Details  Name: [name] MRN: [id number] Date of Birth: 1981/04/07  Today's Date: 08/23/2016 SLP Individual Time: 1000-1100 SLP Individual Time Calculation (min): 60 min  Short Term Goals: Week 1: SLP Short Term Goal 1 (Week 1): Patient will follow 1 step commands in 50% of opportunities with Max A multimodal cues.  SLP Short Term Goal 2 (Week 1): Patient will verbalize at the word level in 25% of opportunities with Max A multimodal cues.  SLP Short Term Goal 3 (Week 1): Patient will demonstrate focused attention to task for ~60 seconds with Max A multimodal cues.  SLP Short Term Goal 4 (Week 1): Patient will demonstrate purposeful behavior in 25% of opportunities with functional tasks with Max A multimodal cues.  SLP Short Term Goal 5 (Week 1): Patient will tolerate PMSV for 50-60 minutes with vitals remaining WLF and Max assist multimodal cues for clearance of secreations.  SLP Short Term Goal 6 (Week 1): Patient will consume trials of ice chips with Min assist multimodal cues for initiation of swallow sequence with overt s/s of aspiraiton in less then 25% of trials to demonstrate readiness for an objective swallow assessment   Skilled Therapeutic Interventions: Skilled treatment session focused on cognitive and dysphagia goals. Upon arrival, patient had been decannulated. SLP facilitated session by Mod A verbal and visual cues for initiation with oral care via the suction toothbrush. Patient was thorough during self-care tasks and required total A for cessation of task. Patient required Max A multimodal cues for initiation of self-feeding as well as initiation of swallow sequence resulting in minimal and intermittent mastication without overt s/s of aspiration. Patient's current swallowing function is characterized by limited oral manipulation and AP transit of ice chips with suspected delay in swallow initiation. Therefore, recommend  patient remain NPO. Patient appeared restless throughout session without attempts to verbalize and followed 1 step commands in 50% of opportunities. Patient left reclined in wheelchair with bilateral mitts in place and family present. Continue with current plan of care.      Function:  Eating Eating   Modified Consistency Diet: Yes (with trials from SLP) Eating Assist Level: Help with picking up utensils;Help managing cup/glass;Helper feeds patient;Helper scoops food on utensil;Helper brings food to mouth     Helper Scoops Food on Utensil: Occasionally Helper Brings Food to Mouth: Occasionally   Cognition Comprehension Comprehension assist level: Understands basic 25 - 49% of the time/ requires cueing 50 - 75% of the time  Expression   Expression assist level: Expresses basis less than 25% of the time/requires cueing >75% of the time.  Social Interaction Social Interaction assist level: Interacts appropriately 25 - 49% of time - Needs frequent redirection.  Problem Solving Problem solving assist level: Solves basic less than 25% of the time - needs direction nearly all the time or does not effectively solve problems and may need a restraint for safety  Memory Memory assist level: Recognizes or recalls less than 25% of the time/requires cueing greater than 75% of the time    Pain No indications of pain   Therapy/Group: Individual Therapy  [name] 08/23/2016, 11:51 AM

## 2016-08-23 NOTE — Progress Notes (Signed)
Occupational Therapy Session Note  Patient Details  Name: [name] MRN: [id number] Date of Birth: Sep 04, 1980  Today's Date: 08/23/2016 OT Individual Time: 0800-0930 OT Individual Time Calculation (min): 90 min    Short Term Goals: Week 1:  OT Short Term Goal 1 (Week 1): Patient will complete transfer from EOB to w/c in prep for BADL with mod A X 1 OT Short Term Goal 2 (Week 1): Pt will complete 2 of 5 grooming tasks with setup, min vc and hand guidance to initiate task. OT Short Term Goal 3 (Week 1): Pt will maintian static stanidng balance with min assist while assisted with lower body dressing task with  OT Short Term Goal 4 (Week 1): Pt will participate in select activity, sustaining focused attention for 5 minutes during functional task.  Skilled Therapeutic Interventions/Progress Updates:    Pt engaged in BADL retraining including bathing/dressing with sit<>stand from w/c at sink.  Pt required max A for stand>pivot transfer to w/c.  Pt required max multimodal cues to initiate bathing tasks at sink.  Pt required max A for sit>stand at sink and min A for standing balance.  Pt removed condom cath while standing and bathed periarea when presented with wash cloth.  Pt initiated dressing tasks when presented with clothing.  Pt was non verbal throughout session.  Pt requires multimodal cues to initiate transitional movements.  Focus on task initiation, sequencing, following one step commands, sit<>stand, standing balance, functional transfers, and safety awareness to increase independence with BADLs.   Therapy Documentation Precautions:  Precautions Precautions: Fall, Cervical Precaution Comments: trach and peg, trach collar vs vent  Restrictions Weight Bearing Restrictions: No Pain:  Pt with no s/s of pain  See Function Navigator for Current Functional Status.   Therapy/Group: Individual Therapy  [name], [name] 08/23/2016, 9:34 AM

## 2016-08-23 NOTE — Progress Notes (Signed)
Occupational Therapy Note  Patient Details  Name: [name] MRN: [id number] Date of Birth: 02/05/1981  Today's Date: 08/23/2016 OT Missed Time: 30 Minutes Missed Time Reason: Patient fatigue  Pt missed 30 mins skilled OT services.  Pt asleep in bed upon arrival.  Attempted to arouse pt adequately to actively participate in therapy.  Pt did not have bilateral mitts on.  Mitts donned and pt remained in bed with alarm activated.     Justin NeriLanier, [name] [facility] Oak[facility]appell 08/23/2016, 2:48 PM

## 2016-08-23 NOTE — Progress Notes (Addendum)
Physical Therapy Session Note  Patient Details  Name: [name] MRN: [id number] Date of Birth: 25-Apr-1981  Today's Date: 08/23/2016 PT Individual Time: 1507-1600 PT Individual Time Calculation (min): 53 min   Short Term Goals: Week 1:  PT Short Term Goal 1 (Week 1): Pt will perform functional transfers with +1 assist PT Short Term Goal 2 (Week 1): Pt will consistently follow 1 step commands 50% of the time  Skilled Therapeutic Interventions/Progress Updates:    Pt received awake & alert in bed. Pt without behaviors demonstrating pain during session. Pt required max multimodal cuing to initiate supine>sitting EOB and then able to do so with mod assist. Pt transferred bed>w/c (stand pivot) and later w/c>bed (ambulatory) with +2 assist, manual facilitation for weight shifting L/R and cuing to take steps. In gym, attempted to engage pt in dynavision, ball toss, ambulation, and checkers. Pt unable to initiate sit>stand transfer even with +2 assist. Pt did engage in moving checker piece x 1 time but otherwise pt not engaging in any activities. At end of session pt left supine in bed with BUE mittens & PRAFO boots donned, bed alarm set, 4 rails up, & family present to supervise.   Therapy Documentation Precautions:  Precautions Precautions: Falls Precaution Comments: Peg tube, NPO, mittens, abdominal binder, ted hose  Restrictions Weight Bearing Restrictions: No  See Function Navigator for Current Functional Status.   Therapy/Group: Individual Therapy  Sandi MariscalVictoria M Jes Costales 08/23/2016, 4:25 PM
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
## 2016-08-24 ENCOUNTER — Inpatient Hospital Stay (HOSPITAL_COMMUNITY): Payer: Self-pay

## 2016-08-24 ENCOUNTER — Inpatient Hospital Stay (HOSPITAL_COMMUNITY): Payer: Self-pay | Admitting: Occupational Therapy

## 2016-08-24 ENCOUNTER — Inpatient Hospital Stay (HOSPITAL_COMMUNITY): Payer: Medicaid Other

## 2016-08-24 ENCOUNTER — Inpatient Hospital Stay (HOSPITAL_COMMUNITY): Payer: Medicaid Other | Admitting: Speech Pathology

## 2016-08-24 ENCOUNTER — Inpatient Hospital Stay (HOSPITAL_COMMUNITY): Payer: Self-pay | Admitting: Physical Therapy

## 2016-08-24 LAB — GLUCOSE, CAPILLARY
GLUCOSE-CAPILLARY: 138 mg/dL — AB (ref 65–99)
Glucose-Capillary: 110 mg/dL — ABNORMAL HIGH (ref 65–99)
Glucose-Capillary: 179 mg/dL — ABNORMAL HIGH (ref 65–99)
Glucose-Capillary: 109 mg/dL — ABNORMAL HIGH (ref 65–99)
Glucose-Capillary: 98 mg/dL (ref 65–99)
Glucose-Capillary: 119 mg/dL — ABNORMAL HIGH (ref 65–99)

## 2016-08-24 MED ORDER — GLUCERNA 1.2 CAL PO LIQD
375.0000 mL | Freq: Four times a day (QID) | ORAL | Status: DC
  Administered 2016-08-24 – 2016-08-26 (×6): 375 mL
  Administered 2016-08-26: 30 mL
  Administered 2016-08-26 – 2016-08-28 (×7): 375 mL
  Filled 2016-08-24 (×12): qty 1000
  Filled 2016-08-24: qty 375
  Filled 2016-08-24 (×2): qty 1000
  Filled 2016-08-24: qty 375
  Filled 2016-08-24: qty 1000
  Filled 2016-08-24 (×2): qty 375
  Filled 2016-08-24 (×2): qty 1000

## 2016-08-24 MED ORDER — FREE WATER
150.0000 mL | Freq: Three times a day (TID) | Status: DC
  Administered 2016-08-24 – 2016-08-28 (×10): 150 mL

## 2016-08-24 MED ORDER — METHYLPHENIDATE HCL 5 MG PO TABS
10.0000 mg | ORAL_TABLET | Freq: Two times a day (BID) | ORAL | Status: DC
  Administered 2016-08-24 – 2016-09-04 (×23): 10 mg
  Filled 2016-08-24 (×23): qty 2

## 2016-08-24 MED ORDER — PRO-STAT SUGAR FREE PO LIQD
30.0000 mL | Freq: Every day | ORAL | Status: DC
  Administered 2016-08-24 – 2016-08-28 (×19): 30 mL
  Filled 2016-08-24 (×18): qty 30

## 2016-08-24 MED ORDER — QUETIAPINE FUMARATE 50 MG PO TABS
150.0000 mg | ORAL_TABLET | Freq: Every day | ORAL | Status: DC
  Administered 2016-08-24 – 2016-08-27 (×4): 150 mg
  Filled 2016-08-24 (×4): qty 1
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
## 2016-08-24 NOTE — Care Management Note (Signed)
Inpatient Rehabilitation Center Individual Statement of Services  Patient Name:  [name]  Date:  08/22/2016  Welcome to the Inpatient Rehabilitation Center.  Our goal is to provide you with an individualized program based on your diagnosis and situation, designed to meet your specific needs.  With this comprehensive rehabilitation program, you will be expected to participate in at least 3 hours of rehabilitation therapies Monday-Friday, with modified therapy programming on the weekends.  Your rehabilitation program will include the following services:  Physical Therapy (PT), Occupational Therapy (OT), Speech Therapy (ST), 24 hour per day rehabilitation nursing, Therapeutic Recreaction (TR), Neuropsychology, Case Management (Social Worker), Rehabilitation Medicine, Nutrition Services and Pharmacy Services  Weekly team conferences will be held on Tuesdays to discuss your progress.  Your Social Worker will talk with you frequently to get your input and to update you on team discussions.  Team conferences with you and your family in attendance may also be held.  Expected length of stay: 4 weeks  Overall anticipated outcome: minimal assistance  Depending on your progress and recovery, your program may change. Your Social Worker will coordinate services and will keep you informed of any changes. Your Social Worker's name and contact numbers are listed  below.  The following services may also be recommended but are not provided by the Inpatient Rehabilitation Center:   Driving Evaluations  Home Health Rehabiltiation Services  Outpatient Rehabilitation Services  Vocational Rehabilitation   Arrangements will be made to provide these services after discharge if needed.  Arrangements include referral to agencies that provide these services.  Your insurance has been verified to be:  None (SSD and Medicaid applications are pending) Your primary doctor is:  None (rehab social worker can assist  with establishing a primary care MD)  Pertinent information will be shared with your doctor and your insurance company.  Social Worker:  [place] ForkLucy Charlette Hennings, [place] [phone] or ([phone]) [phone]   Information discussed with and copy given to patient by: Amada JupiterHOYLE, Jennye Runquist, 08/22/2016, 4:45 PM

## 2016-08-24 NOTE — Progress Notes (Signed)
Orthopedic Tech Progress Note Patient Details:  Justin ElseDarren T Page June 27, 1981 [id number]  Ortho Devices Type of Ortho Device: Abdominal binder Ortho Device/Splint Interventions: Casandra DoffingOrdered   [name] 08/24/2016, 6:50 PM

## 2016-08-24 NOTE — Progress Notes (Signed)
Physical Therapy Note  Patient Details  Name: [name] MRN: [id number] Date of Birth: [birth year] Today's Date: 08/24/2016    Time: [phone] 54 minutes  1:1 No signs/symptoms of pain.  Pt with eyes closed throughout most of session, would open eyes for 1-2 seconds before closing them again.  Multiple bouts of gait for arousal and general strengthening with +2 assist 3 musketeers style up to 200'.  Nu step for general strengthening x 6 minutes with pt able to use UEs and LEs continuing with eyes closed.  Attempt ball toss with pt able to catch ball with gentle tosses, unable to throw the ball but able to hand ball back.  Able to attend <30 seconds before internally and externally distracted.  Pt performed furniture transfers to low couch and chair with max A to stand.  Pt with improved initiation this session.   DONAWERTH,KAREN 08/24/2016, 10:24 AM
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
## 2016-08-24 NOTE — Progress Notes (Signed)
Physical Therapy Weekly Progress Note  Patient Details  Name: [name] MRN: [id number] Date of Birth: [birth year]  Beginning of progress report period: August 19, 2016 End of progress report period: August 25, 2016   Patient has met 0 of 2 short term goals.  Pt continues to fluctuate assist levels between 1 and 2 person assist depending on attention, fatigue and initiation. Pt able to follow 1 step commands with increased time and multimodal cuing 25% of the time.  Patient continues to demonstrate the following deficits muscle weakness, motor apraxia, decreased coordination and decreased motor planning, decreased initiation, decreased attention, decreased awareness, decreased problem solving, decreased safety awareness and delayed processing and decreased standing balance, decreased postural control and decreased balance strategies and therefore will continue to benefit from skilled PT intervention to increase functional independence with mobility.  Patient progressing toward long term goals..  Continue plan of care.  PT Short Term Goals Week 1:  PT Short Term Goal 1 (Week 1): Pt will perform functional transfers with +1 assist PT Short Term Goal 1 - Progress (Week 1): Progressing toward goal PT Short Term Goal 2 (Week 1): Pt will consistently follow 1 step commands 50% of the time PT Short Term Goal 2 - Progress (Week 1): Progressing toward goal Week 2:  PT Short Term Goal 1 (Week 2): Pt will consistently perform transfers with +1 assist PT Short Term Goal 2 (Week 2): Pt will follow 1 step commands 50% of the time  Skilled Therapeutic Interventions/Progress Updates:  Ambulation/gait training;Cognitive remediation/compensation;Discharge planning;DME/adaptive equipment instruction;Functional mobility training;Pain management;Splinting/orthotics;Therapeutic Activities;UE/LE Strength taining/ROM;UE/LE Coordination activities;Therapeutic Exercise;Stair training;Patient/family  education;Neuromuscular re-education;Functional electrical stimulation;Community reintegration;Balance/vestibular training    See Function Navigator for Current Functional Status.  [name] 08/24/2016, 8:22 AM

## 2016-08-24 NOTE — Progress Notes (Signed)
Speech Language Pathology Daily Session Note  Patient Details  Name: [name] MRN: [id number] Date of Birth: February 03, 1981  Today's Date: 08/24/2016 SLP Individual Time: 1400-1500 SLP Individual Time Calculation (min): 60 min  Short Term Goals: Week 1: SLP Short Term Goal 1 (Week 1): Patient will follow 1 step commands in 50% of opportunities with Max A multimodal cues.  SLP Short Term Goal 2 (Week 1): Patient will verbalize at the word level in 25% of opportunities with Max A multimodal cues.  SLP Short Term Goal 3 (Week 1): Patient will demonstrate focused attention to task for ~60 seconds with Max A multimodal cues.  SLP Short Term Goal 4 (Week 1): Patient will demonstrate purposeful behavior in 25% of opportunities with functional tasks with Max A multimodal cues.  SLP Short Term Goal 5 (Week 1): Patient will tolerate PMSV for 50-60 minutes with vitals remaining WLF and Max assist multimodal cues for clearance of secreations.  SLP Short Term Goal 6 (Week 1): Patient will consume trials of ice chips with Min assist multimodal cues for initiation of swallow sequence with overt s/s of aspiraiton in less then 25% of trials to demonstrate readiness for an objective swallow assessment   Skilled Therapeutic Interventions: Skilled treatment session focused on cognitive goals. Upon arrival, patient was supine in bed and appeared in pain due to grimacing and rubbing near PEG site. Patient also appeared to have increased upper airway congestion, suspect due to positioning. RN and PA aware. Patient required total A to answer yes/no questions in regards to wants/needs due to only nodding his head "yes." Patient unable to follow commands in regards to coughing on command to clear congestion and required Max-Total A multimodal cues for following 1 step commands during bed mobility, toileting and self-care tasks. However, patient's congestion appeared to improve when patient sitting upright on EOB or while on  BSC. Patient also required total A for standing and for sitting. Patient left supine in bed (awaiting breathing treatment) with bilateral mitts in place and bed alarm on. Continue with current plan of care.      Function:   Cognition Comprehension Comprehension assist level: Understands basic 25 - 49% of the time/ requires cueing 50 - 75% of the time  Expression   Expression assist level: Expresses basis less than 25% of the time/requires cueing >75% of the time.  Social Interaction Social Interaction assist level: Interacts appropriately less than 25% of the time. May be withdrawn or combative.  Problem Solving Problem solving assist level: Solves basic less than 25% of the time - needs direction nearly all the time or does not effectively solve problems and may need a restraint for safety  Memory Memory assist level: Recognizes or recalls less than 25% of the time/requires cueing greater than 75% of the time    Pain Patient grimacing like he was in pain, suspect stomach pain due to rubbing near PEG site.   Therapy/Group: Individual Therapy  Marlaya Turck 08/24/2016, 3:13 PM

## 2016-08-24 NOTE — Progress Notes (Signed)
Occupational Therapy Note  Patient Details  Name: [name] MRN: [id number] Date of Birth: 1980/08/24  Today's Date: 08/24/2016 OT Individual Time: 1100-1156 OT Individual Time Calculation (min): 56 min   Pt with no s/s of pain Individual therapy  Pt resting in w/c with NT present.  Pt is Q3 for toileting.  Pt required tot A + 2 for sit<>stand from w/c in preparation for toilet transfer.  Pt performed stand pivot with steady A but required tot A +2 to flex hips and sit on toilet.  Pt incontinent of bowel but not urine.  Pt did not have bowel movement or void while sitting on toilet.  Pt required tot A for sit<>stand from toilet and min a while standing.  Pt pulled up pants with steady A. Pt transitioned to therapy gym.  Focus on following one step commands-sorting cards by color, stacking cups, sorting tokens by color.  Pt did not initiate or perform any tasks.  Pt presented with paper and pen and asked to write his name.  Pt illegibly wrote several random letters on paper.  The same result was reached when asked to write the names of his children.  Pt performed sit<>stand with min A X 1.  Pt returned to nursing station in w/c with bilateral mitts donned and QRB in place.    [name], [name] [facility] LLCChappell 08/24/2016, 12:09 PM
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
## 2016-08-24 NOTE — Progress Notes (Signed)
Occupational Therapy Session Note  Patient Details  Name: [name] MRN: [id number] Date of Birth: 01/16/1981  Today's Date: 08/24/2016 OT Individual Time: 0700-0830 OT Individual Time Calculation (min): 90 min    Short Term Goals: Week 1:  OT Short Term Goal 1 (Week 1): Patient will complete transfer from EOB to w/c in prep for BADL with mod A X 1 OT Short Term Goal 2 (Week 1): Pt will complete 2 of 5 grooming tasks with setup, min vc and hand guidance to initiate task. OT Short Term Goal 3 (Week 1): Pt will maintian static stanidng balance with min assist while assisted with lower body dressing task with  OT Short Term Goal 4 (Week 1): Pt will participate in select activity, sustaining focused attention for 5 minutes during functional task.  Skilled Therapeutic Interventions/Progress Updates:    Pt resting in bed upon arrival.  Pt engaged in BADL retraining including bathing at shower level and dressing with sit<>stand from w/c at sink.  Pt required min tactile cues to move BLE to edge of bed in preparation for supine>sit EOB.  Pt required mod A for supine>sit EOB.  Pt required tot A +2 to initiate sit>stand but completed task with mod A.  Pt performed stand pivot transfer with mod A.  Pt required max multimodal cues to perform stand>sit in w/c.  Pt initiated approx 50% of bathing tasks when presented with wash cloth but perseverated on washing his chest.  Pt required max multimodal cues to transition to segments of bathing tasks.  Pt required max multimodal cues and max A for sit<>stand in shower to bathe buttocks.  Stand pivot transfer to w/c with mod A.  Pt donned his shirt at supervision level when presented with shirt.  Pt initiated pulling up pants when standing.  Pt was able to don his R sock/shoe but required tot A for his L sock/shoe.  Pt's eyes remained closed for the majority of the session.  Focus on functional transfers, sit<>stand, standing balance, task initiation, sequencing,  attention to task, cognitive remediation, and safety awareness to increase independence with BADLs.   Therapy Documentation Precautions:  Precautions Precautions: Fall, Cervical Precaution Comments: Peg Restrictions Weight Bearing Restrictions: No Pain:  Pt with no s/s of pain  See Function Navigator for Current Functional Status.   Therapy/Group: Individual Therapy  Rich BraveLanier, Gladie Gravette Chappell 08/24/2016, 8:35 AM

## 2016-08-25 ENCOUNTER — Inpatient Hospital Stay (HOSPITAL_COMMUNITY): Payer: Medicaid Other | Admitting: Speech Pathology

## 2016-08-25 ENCOUNTER — Inpatient Hospital Stay (HOSPITAL_COMMUNITY): Payer: Self-pay

## 2016-08-25 ENCOUNTER — Inpatient Hospital Stay (HOSPITAL_COMMUNITY): Payer: Self-pay | Admitting: Physical Therapy

## 2016-08-25 LAB — GLUCOSE, CAPILLARY
GLUCOSE-CAPILLARY: 120 mg/dL — AB (ref 65–99)
GLUCOSE-CAPILLARY: 122 mg/dL — AB (ref 65–99)
GLUCOSE-CAPILLARY: 129 mg/dL — AB (ref 65–99)
GLUCOSE-CAPILLARY: 91 mg/dL (ref 65–99)
Glucose-Capillary: 80 mg/dL (ref 65–99)
Glucose-Capillary: 105 mg/dL — ABNORMAL HIGH (ref 65–99)

## 2016-08-25 LAB — CBC
HEMATOCRIT: 39 % (ref 39.0–52.0)
Hemoglobin: 12.5 g/dL — ABNORMAL LOW (ref 13.0–17.0)
MCH: 30.2 pg (ref 26.0–34.0)
MCHC: 32.1 g/dL (ref 30.0–36.0)
MCV: 94.2 fL (ref 78.0–100.0)
PLATELETS: 319 10*3/uL (ref 150–400)
RBC: 4.14 MIL/uL — AB (ref 4.22–5.81)
RDW: 15 % (ref 11.5–15.5)
WBC: 6.8 10*3/uL (ref 4.0–10.5)

## 2016-08-25 NOTE — Progress Notes (Signed)
Occupational Therapy Session Note  Patient Details  Name: [name] Yetter MRN: [id number] Date of Birth: 1980-09-13  Today's Date: 08/25/2016 OT Individual Time: 0700-0800 OT Individual Time Calculation (min): 60 min    Short Term Goals: Week 1:  OT Short Term Goal 1 (Week 1): Patient will complete transfer from EOB to w/c in prep for BADL with mod A X 1 OT Short Term Goal 2 (Week 1): Pt will complete 2 of 5 grooming tasks with setup, min vc and hand guidance to initiate task. OT Short Term Goal 3 (Week 1): Pt will maintian static stanidng balance with min assist while assisted with lower body dressing task with  OT Short Term Goal 4 (Week 1): Pt will participate in select activity, sustaining focused attention for 5 minutes during functional task.  Skilled Therapeutic Interventions/Progress Updates:    Pt resting in bed upon arrival with fiancee present.  Pt required min multimodal cues to initiate moving BLE to edge of bed.  Pt completed remainder of supine>sit EOB with supervision.  Pt performed sit>stand at EOB with mod A and min facilitation at hips to turn for stand>sit in w/c with max A for hip flexion.  Pt incontinent of bowel and transitioned to bathroom to use toilet.  Pt continued with bowel movement on toilet.  Pt required tot A for toileting tasks.  Pt returned to w/c with max A for sit<>stand.  Pt completed bathing/dressing tasks at sink.  Pt donned shirt with supervision.  Pt continues to require max multimodal cues for majority of functional tasks.  Pt continues to be nonverbal.  Pt remained in w/c with QRB in place and fiancee present.   Therapy Documentation Precautions:  Precautions Precautions: Fall, Cervical Precaution Comments: trach and peg, trach collar vs vent  Restrictions Weight Bearing Restrictions: No Pain:  Pt denied pain  See Function Navigator for Current Functional Status.   Therapy/Group: Individual Therapy  Rich BraveLanier, Lilybeth Vien Chappell 08/25/2016, 8:03  AM
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
## 2016-08-25 NOTE — Progress Notes (Signed)
Physical Therapy Session Note  Patient Details  Name: [name] MRN: [id number] Date of Birth: July 07, 1980  Today's Date: 08/25/2016 PT Individual Time: 1400-1434 PT Individual Time Calculation (min): 34 min   Short Term Goals: Week 1:  PT Short Term Goal 1 (Week 1): Pt will perform functional transfers with +1 assist PT Short Term Goal 1 - Progress (Week 1): Progressing toward goal PT Short Term Goal 2 (Week 1): Pt will consistently follow 1 step commands 50% of the time PT Short Term Goal 2 - Progress (Week 1): Progressing toward goal  Skilled Therapeutic Interventions/Progress Updates:    Pt in bed upon arrival in Lt sidelying. Pt assisted to sitting with mod assist at trunk and assist to bring LEs to EOB. Once sitting pt maintaining balance with supervision. Donning shoes performed with pt assisting with pushing heels down. Sit<>stand transfers performed from bed to w/c and w/c<>toilet. Physical assist needed to initial motion to standing with pt holding PT for assist. Steps taken for pivot transfer with weightshifting. Nursing assisting with cleaning pt following toileting. Pt up in w/c with safety strap. Nursing and fiance request no mitts applied following. Pt up with nursing and fiance after session.    Therapy Documentation Precautions:  Precautions Precautions: Fall, Cervical Precaution Comments: trach and peg, trach collar vs vent  Restrictions Weight Bearing Restrictions: No Pain:  No expressions of pain noted throughout session.     See Function Navigator for Current Functional Status.   Therapy/Group: Individual Therapy  Delton SeeBenjamin Makaia Rappa, PT 08/25/2016, 4:40 PM

## 2016-08-25 NOTE — Progress Notes (Signed)
Physical Therapy Note  Patient Details  Name: [name] MRN: [id number] Date of Birth: 25-Oct-1980 Today's Date: 08/25/2016    Time: 1438-1545 67 minutes  1:1 No signs/symptoms of pain.  Pt awake with eyes open throughout session.  Stair negotiation x 4 stairs with 2 handrails with mod A for wt shift an max tactile cues for initiation, pt requires increased time and cuing for each step.  Pt continues to require +2 assist for safety but max A to stand with pt requiring max A for first 25% of stand, able to complete final 75% with min A.  Gait 2 x 200' with +2 hand held assist, min/mod A for balance due to posterior lean. Pt frequently catching Rt toe, requiring mod A to correct and cues for increasing step and stride length.  Pipe tree activity with increased time and hand over hand cuing to initiate but pt able to complete putting pipe on tree with cues for initiation.  Pt requires max A for sit to stand and stand pivot transfer to bed, able to perform sit to supine with min cuing.  Pt initiated taking shoes off with min cuing.   Madyson Lukach 08/25/2016, 3:53 PM

## 2016-08-25 NOTE — Progress Notes (Signed)
Received message to contact patient's mother at [phone].  Called Angela back at [phone].  Voiced concerns that she was not involved in patients plan of care and changes in medications.  She request that MD contact her.  Left message for Dr Riley KillSwartz to return Angela's call.

## 2016-08-25 NOTE — Progress Notes (Signed)
Late entry: Attempted timed toileting. Mother at bedside.  Pt unable to follow one step commands and was unsuccessful with toilet.  Pt incontinent of urine and Bm.  Discussed with therapy.

## 2016-08-25 NOTE — Progress Notes (Signed)
Speech Language Pathology Weekly Progress and Session Note  Patient Details  Name: [name] MRN: [id number] Date of Birth: Oct 10, 1980  Beginning of progress report period: August 18, 2016 End of progress report period: August 25, 2016  Today's Date: 08/25/2016 SLP Individual Time: 0915-1005 SLP Individual Time Calculation (min): 50 min  Short Term Goals: Week 1: SLP Short Term Goal 1 (Week 1): Patient will follow 1 step commands in 50% of opportunities with Max A multimodal cues.  SLP Short Term Goal 1 - Progress (Week 1): Met SLP Short Term Goal 2 (Week 1): Patient will verbalize at the word level in 25% of opportunities with Max A multimodal cues.  SLP Short Term Goal 2 - Progress (Week 1): Not met SLP Short Term Goal 3 (Week 1): Patient will demonstrate focused attention to task for ~60 seconds with Max A multimodal cues.  SLP Short Term Goal 3 - Progress (Week 1): Not met SLP Short Term Goal 4 (Week 1): Patient will demonstrate purposeful behavior in 25% of opportunities with functional tasks with Max A multimodal cues.  SLP Short Term Goal 4 - Progress (Week 1): Met SLP Short Term Goal 5 (Week 1): Patient will tolerate PMSV for 50-60 minutes with vitals remaining WLF and Max assist multimodal cues for clearance of secreations.  SLP Short Term Goal 5 - Progress (Week 1): Met SLP Short Term Goal 6 (Week 1): Patient will consume trials of ice chips with Min assist multimodal cues for initiation of swallow sequence with overt s/s of aspiraiton in less then 25% of trials to demonstrate readiness for an objective swallow assessment  SLP Short Term Goal 6 - Progress (Week 1): Not met    New Short Term Goals: Week 2: SLP Short Term Goal 1 (Week 2): Patient will follow 1 step commands in 75% of opportunities with Max A multimodal cues.  SLP Short Term Goal 2 (Week 2): Patient will verbalize at the word level in 25% of opportunities with Max A multimodal cues.  SLP Short Term Goal 3  (Week 2): Patient will demonstrate focused attention to task for ~60 seconds with Max A multimodal cues.  SLP Short Term Goal 4 (Week 2): Patient will consume trials of ice chips with Min assist multimodal cues for initiation of swallow sequence with overt s/s of aspiraiton in less then 25% of trials to demonstrate readiness for an objective swallow assessment  SLP Short Term Goal 5 (Week 2): Patient will initiate functional tasks with Max A verbal cues in 50% of opportunities   Weekly Progress Updates: Patient has made functional gains and has met 3 of 6 STG's this reporting period. Currently, patient demonstrates behaviors consistent with a Rancho Level V and requires Max-total A to complete functional and familiar tasks safely in regards to initiation, functional problem solving, awareness and recall. However, patient has been making gain with initiation of functional tasks. Patient is currently decannulated but continues to be nonverbal, suspect due to cognitive deficits. Patient also follows 1 step commands in 50% of opportunities with Max A multimodal cues. Patient has been consuming trials of ice chips with intermittent subtle, overt s/s of aspiration with a suspected delayed swallow initiation. Recommend continued trials with SLP only. Patient would benefit from continued skilled SLP intervention to maximize cognitive and swallowing function and functional communication in order to improve his overall functional independence prior to discharge.      Intensity: Minumum of 1-2 x/day, 30 to 90 minutes Frequency: 3 to 5 out  of 7 days Duration/Length of Stay: 3 weeks  Treatment/Interventions: English as a second language teacher;Dysphagia/aspiration precaution training;Environmental controls;Functional tasks;Patient/family education   Daily Session  Skilled Therapeutic Interventions: Skilled treatment session focused on cognitive and dysphagia goals.  Patient performed oral care via the suction toothbrush with Mod  verbal and tactile cues for initiation and Max multimodal cues for cessation of task. Patient consumed trials of ice chips without overt s/s of aspiration but required Mod A verbal cues for mastication of ice chips and swallow initiation.  Recommend trials with SLP only. Patient nonverbal despite Max A multimodal cues and identified items from a field of 2 with 50% accuracy, all items he chose were in his left field of enviornment. Patient with increased restlessness at end of session, suspect due to needing to use the bathroom. Patient incontinent of bowel but initiated standing and sitting with Mod A multimodal cues during self-care tasks. Patient left with NT and RN. Continue with current plan of care.       Function:   Eating Eating   Modified Consistency Diet: Yes (with trials from SLP) Eating Assist Level: Helper feeds patient;Helper scoops food on utensil;Supervision or verbal cues;Set up assist for   Eating Set Up Assist For: Opening containers Helper Scoops Food on Utensil: Occasionally     Cognition Comprehension Comprehension assist level: Understands basic 25 - 49% of the time/ requires cueing 50 - 75% of the time  Expression   Expression assist level: Expresses basis less than 25% of the time/requires cueing >75% of the time.  Social Interaction Social Interaction assist level: Interacts appropriately less than 25% of the time. May be withdrawn or combative.  Problem Solving Problem solving assist level: Solves basic less than 25% of the time - needs direction nearly all the time or does not effectively solve problems and may need a restraint for safety  Memory Memory assist level: Recognizes or recalls less than 25% of the time/requires cueing greater than 75% of the time   Pain No indications of pain  Therapy/Group: Individual Therapy  [name] 08/25/2016, 3:08 PM

## 2016-08-25 NOTE — Progress Notes (Signed)
Occupational Therapy Note  Patient Details  Name: Elmon ElseDarren T Mchale MRN: [id number] Date of Birth: [birth year]  Today's Date: 08/25/2016 OT Individual Time: 1100-1130 OT Individual Time Calculation (min): 30 min   Pt denied pain Individual Therapy  Pt resting in w/c upon arrival with fiancee present and listening to music.  Pt transitioned to therapy gym and engaged in table tasks with focus on following one step commands.  Pt completed task of stacking cups with min multimodal cues.  Pt stopped half way through task and began fidgeting in w/c.  Pt transitioned back to room and performed stand pivot transfer with mod A for sit<>stand and steady A for pivot with manual facilitation at hips to cue turn.  Pt sat EOB with steady A and performed sit>supine with supervision.  Pt remained in bed with fiancee present and alarm activated.   Lavone NeriLanier, Rykin Route [facility] CenterChappell 08/25/2016, 11:30 AM

## 2016-08-25 NOTE — Progress Notes (Signed)
Social Work Patient ID: [name], male   DOB: 1981-02-14, 36 y.o.   MRN: [id number]   Have reviewed team conference with pt's mother who is aware that team has estimated 4 week LOS nad with mostly min assist goals.  She is aware and agreeable.  Helvi Royals, LCSW

## 2016-08-26 ENCOUNTER — Inpatient Hospital Stay (HOSPITAL_COMMUNITY): Payer: Self-pay | Admitting: Physical Therapy

## 2016-08-26 DIAGNOSIS — S062X3S Diffuse traumatic brain injury with loss of consciousness of 1 hour to 5 hours 59 minutes, sequela: Secondary | ICD-10-CM

## 2016-08-26 DIAGNOSIS — F068 Other specified mental disorders due to known physiological condition: Secondary | ICD-10-CM

## 2016-08-26 DIAGNOSIS — S069X0S Unspecified intracranial injury without loss of consciousness, sequela: Secondary | ICD-10-CM

## 2016-08-26 DIAGNOSIS — I10 Essential (primary) hypertension: Secondary | ICD-10-CM

## 2016-08-26 DIAGNOSIS — R1312 Dysphagia, oropharyngeal phase: Secondary | ICD-10-CM

## 2016-08-26 LAB — GLUCOSE, CAPILLARY
GLUCOSE-CAPILLARY: 159 mg/dL — AB (ref 65–99)
GLUCOSE-CAPILLARY: 107 mg/dL — AB (ref 65–99)
Glucose-Capillary: 96 mg/dL (ref 65–99)
Glucose-Capillary: 139 mg/dL — ABNORMAL HIGH (ref 65–99)
Glucose-Capillary: 98 mg/dL (ref 65–99)

## 2016-08-26 NOTE — Progress Notes (Signed)
Physical Therapy Session Note  Patient Details  Name: Russel T ClarElmon Elsek MRN: [id number] Date of Birth: 1981-03-06  Today's Date: 08/26/2016 PT Individual Time: 1445-1535 PT Individual Time Calculation (min): 50 min   Short Term Goals: Week 2:  PT Short Term Goal 1 (Week 2): Pt will consistently perform transfers with +1 assist PT Short Term Goal 2 (Week 2): Pt will follow 1 step commands 50% of the time  Skilled Therapeutic Interventions/Progress Updates: Performed sit/stand from w/c maxA with cues to mat. Attempted peg board activities with max cues for attention to task with PTA providing one step commands to place peg on board. Pt able to follow task 1/8 trials. Gait 2750ft with HHA and +2 for safety. Pt with narrow BOS with shortened step length. Pt trial on NuStep for attention and general conditioning. Pt able to maintain activity for approx 1 minute bouts with pt occasionally taking legs off device and only using UE.  Pt ambulated add'l 7350ft with mod cues for sequencing with turns and increasing step length which pt was able to perform x 3 times. Pt returned to w/c and returned to nsg station with NT advised for timed toileting.      Therapy Documentation Precautions:  Precautions Precautions: Fall, Cervical Precaution Comments: trach and peg, trach collar vs vent  Restrictions Weight Bearing Restrictions: No General:   Vital Signs: Therapy Vitals Temp: 97.7 F (36.5 C) Temp Source: Oral Pulse Rate: 83 Resp: 17 BP: (!) 133/95 Patient Position (if appropriate): Sitting Oxygen Therapy SpO2: 98 % O2 Device: Not Delivered   See Function Navigator for Current Functional Status.   Therapy/Group: Individual Therapy  Makita Blow  Armonii Sieh, PTA  08/26/2016, 4:40 PM
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
## 2016-08-27 ENCOUNTER — Inpatient Hospital Stay (HOSPITAL_COMMUNITY): Payer: Self-pay | Admitting: *Deleted

## 2016-08-27 LAB — GLUCOSE, CAPILLARY
GLUCOSE-CAPILLARY: 113 mg/dL — AB (ref 65–99)
GLUCOSE-CAPILLARY: 140 mg/dL — AB (ref 65–99)
GLUCOSE-CAPILLARY: 166 mg/dL — AB (ref 65–99)
GLUCOSE-CAPILLARY: 95 mg/dL (ref 65–99)
Glucose-Capillary: 84 mg/dL (ref 65–99)
Glucose-Capillary: 109 mg/dL — ABNORMAL HIGH (ref 65–99)
Glucose-Capillary: 67 mg/dL (ref 65–99)

## 2016-08-27 NOTE — Progress Notes (Signed)
Pt unable to get out off bed to use the bathroom. Sleepy and unable to open his eyes. We will try later

## 2016-08-27 NOTE — Progress Notes (Signed)
Occupational Therapy Session Note  Patient Details  Name: [name] MRN: [id number] Date of Birth: 1980-09-15  Today's Date: 08/27/2016 OT Individual Time: [id number] OT Individual Time Calculation (min): 56 min    Short Term Goals: Week 2:  OT Short Term Goal 1 (Week 2): Pt will complete 2 of 5 grooming tasks with setup, min vc and hand guidance to initiate task. OT Short Term Goal 2 (Week 2): Pt will participate in select activity, sustaining focused attention for 5 minutes during functional task. OT Short Term Goal 3 (Week 2): Pt will thread BLE into pants with mn verbal cues OT Short Term Goal 4 (Week 2): Pt will initiate bathing tasks with min verbal cues  OT Short Term Goal 5 (Week 2): Pt will perform toilet transfers with mod A +1  Skilled Therapeutic Interventions/Progress Updates:    Pt resting in bed upon arrival.  Pt engaged in BADL retraining including bathing/dressing with sit<>stand from w/c at sink.  Pt required min tactile cues to initiate moving BLE to edge of bed in preparation for sitting EOB.  Pt completed task without assistance.  Pt required mod A and max multimodal cues to perform sit<>stand from EOB to perform stand pivot transfer to w/c.  Pt continues to required max multimodal cues to initiate and complete bathing tasks.  Pt performed sit<>stand from w/c at sink with min A and steady A for standing balance.  Pt sat back into w/c with max verbal cues for initiation.  Pt donned shirt at supervision level.  Pt attempted to initiate threading pants but required min A to hold pants while he lifted his legs into pants.  Pt pulled up pants with steady A.  Pt required tot A for donning socks but donned shoes without assistance.  Pt remained in w/c with QRB in place and bilateral mitts donned.  Pt returned to nursing station. Focus on task initiation, sequencing, attention to task, sit<>stand, standing balance, and functional transfers to increase independence with BADLs.    Therapy Documentation Precautions:  Precautions Precautions: Fall, Cervical Precaution Comments: trach and peg, trach collar vs vent  Restrictions Weight Bearing Restrictions: No   Pain:  Pt will no s/s of pain  See Function Navigator for Current Functional Status.   Therapy/Group: Individual Therapy  Rich BraveLanier, Jeanetta Alonzo Chappell 08/27/2016, 8:57 AM
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
## 2016-08-27 NOTE — Significant Event (Signed)
Hypoglycemic Event  CBG: 67  Treatment: 15 GM carbohydrate snack  Symptoms: None  Follow-up CBG: Time:2130 CBG Result:95  Possible Reasons for Event: Unknown  Comments/MD notified: Dr. Janeece AgeeLeschber    Acire Tang, Saunders GlanceAnissa D

## 2016-08-27 NOTE — Progress Notes (Signed)
Occupational Therapy Weekly Progress Note  Patient Details  Name: [name] MRN: [id number] Date of Birth: Mar 15, 1981  Beginning of progress report period: August 19, 2016 End of progress report period: August 27, 2016   Patient has met 2 of 4 short term goals.  Pt has made steady progress with BADLs (bathing/dressing, toileting, and functional transfers) since admission.  Pt continues to require max multimodal cues and extra time to initiate sit<>stand; pt's assist level has ranged from min A to tot A +2 for sit<>stand.  Pt performs stand pivot transfers (after standing) with steady A and tactile cues at hips to initiate turning to sit.  Pt initiates donning his shirt and shoes when presented but requires hand over hand assist to initiate all other bathing/dressing tasks.  Pt tends to perseverate on bathing his upper legs and periarea when engaged in bating tasks.  Pt is nonverbal and follows one step functional commands with approx 25% without tactile cues.  Pt exhibits behaviors consistent with [place] Level V. Patient continues to demonstrate the following deficits: muscle weakness, decreased cardiorespiratoy endurance, impaired timing and sequencing, unbalanced muscle activation, decreased coordination and decreased motor planning, decreased visual acuity, decreased visual perceptual skills and decreased visual motor skills, decreased midline orientation, decreased attention to right, decreased motor planning and ideational apraxia, decreased initiation, decreased attention, decreased awareness, decreased problem solving, decreased safety awareness, decreased memory and delayed processing and decreased sitting balance, decreased standing balance, decreased postural control, decreased balance strategies and difficulty maintaining precautions and therefore will continue to benefit from skilled OT intervention to enhance overall performance with BADL, iADL and Reduce care partner  burden.  Patient progressing toward long term goals..  Continue plan of care.  OT Short Term Goals Week 1:  OT Short Term Goal 1 (Week 1): Patient will complete transfer from EOB to w/c in prep for BADL with mod A X 1 OT Short Term Goal 1 - Progress (Week 1): Met OT Short Term Goal 2 (Week 1): Pt will complete 2 of 5 grooming tasks with setup, min vc and hand guidance to initiate task. OT Short Term Goal 2 - Progress (Week 1): Progressing toward goal OT Short Term Goal 3 (Week 1): Pt will maintian static stanidng balance with min assist while assisted with lower body dressing task with  OT Short Term Goal 3 - Progress (Week 1): Met OT Short Term Goal 4 (Week 1): Pt will participate in select activity, sustaining focused attention for 5 minutes during functional task. OT Short Term Goal 4 - Progress (Week 1): Progressing toward goal Week 2:  OT Short Term Goal 1 (Week 2): Pt will complete 2 of 5 grooming tasks with setup, min vc and hand guidance to initiate task. OT Short Term Goal 2 (Week 2): Pt will participate in select activity, sustaining focused attention for 5 minutes during functional task. OT Short Term Goal 3 (Week 2): Pt will thread BLE into pants with mn verbal cues OT Short Term Goal 4 (Week 2): Pt will initiate bathing tasks with min verbal cues  OT Short Term Goal 5 (Week 2): Pt will perform toilet transfers with mod A +1  Skilled Therapeutic Interventions/Progress Updates:      Therapy Documentation Precautions:  Precautions Precautions: Fall,  Precaution Comments: Peg tube Restrictions Weight Bearing Restrictions: No  See Function Navigator for Current Functional Status.    Leotis Shames [facility] 08/27/2016, 8:57 AM

## 2016-08-28 ENCOUNTER — Inpatient Hospital Stay (HOSPITAL_COMMUNITY): Payer: Medicaid Other | Admitting: Physical Therapy

## 2016-08-28 ENCOUNTER — Inpatient Hospital Stay (HOSPITAL_COMMUNITY): Payer: Self-pay

## 2016-08-28 ENCOUNTER — Inpatient Hospital Stay (HOSPITAL_COMMUNITY): Payer: Medicaid Other | Admitting: Speech Pathology

## 2016-08-28 ENCOUNTER — Inpatient Hospital Stay (HOSPITAL_COMMUNITY): Payer: Self-pay | Admitting: Occupational Therapy

## 2016-08-28 DIAGNOSIS — S069X3S Unspecified intracranial injury with loss of consciousness of 1 hour to 5 hours 59 minutes, sequela: Secondary | ICD-10-CM

## 2016-08-28 DIAGNOSIS — I158 Other secondary hypertension: Secondary | ICD-10-CM | POA: Insufficient documentation

## 2016-08-28 DIAGNOSIS — Z298 Encounter for other specified prophylactic measures: Secondary | ICD-10-CM

## 2016-08-28 DIAGNOSIS — D62 Acute posthemorrhagic anemia: Secondary | ICD-10-CM

## 2016-08-28 LAB — GLUCOSE, CAPILLARY
GLUCOSE-CAPILLARY: 79 mg/dL (ref 65–99)
GLUCOSE-CAPILLARY: 113 mg/dL — AB (ref 65–99)
GLUCOSE-CAPILLARY: 144 mg/dL — AB (ref 65–99)
GLUCOSE-CAPILLARY: 140 mg/dL — AB (ref 65–99)
Glucose-Capillary: 120 mg/dL — ABNORMAL HIGH (ref 65–99)
Glucose-Capillary: 147 mg/dL — ABNORMAL HIGH (ref 65–99)
Glucose-Capillary: 168 mg/dL — ABNORMAL HIGH (ref 65–99)

## 2016-08-28 MED ORDER — JEVITY 1.5 CAL/FIBER PO LIQD
325.0000 mL | Freq: Four times a day (QID) | ORAL | Status: DC
  Administered 2016-08-28 – 2016-09-06 (×34): 325 mL
  Filled 2016-08-28 (×41): qty 1000

## 2016-08-28 MED ORDER — CALCIUM POLYCARBOPHIL 625 MG PO TABS
625.0000 mg | ORAL_TABLET | Freq: Every day | ORAL | Status: DC
  Administered 2016-08-28 – 2016-09-19 (×23): 625 mg via ORAL
  Filled 2016-08-28 (×24): qty 1

## 2016-08-28 MED ORDER — QUETIAPINE FUMARATE 100 MG PO TABS
100.0000 mg | ORAL_TABLET | Freq: Every day | ORAL | Status: DC
  Administered 2016-08-28 – 2016-08-29 (×2): 100 mg
  Filled 2016-08-28 (×2): qty 1

## 2016-08-28 MED ORDER — PRO-STAT SUGAR FREE PO LIQD
30.0000 mL | Freq: Four times a day (QID) | ORAL | Status: DC
  Administered 2016-08-28 – 2016-09-08 (×41): 30 mL
  Filled 2016-08-28 (×41): qty 30

## 2016-08-28 MED ORDER — FREE WATER
200.0000 mL | Freq: Four times a day (QID) | Status: DC
  Administered 2016-08-28 – 2016-09-08 (×41): 200 mL

## 2016-08-28 NOTE — Progress Notes (Signed)
Physical Therapy Session Note  Patient Details  Name: [name] MRN: [id number] Date of Birth: 1981-01-11  Today's Date: 08/28/2016 PT Individual Time: [id number] and 1305-1403 PT Individual Time Calculation (min): 29 min and 58 min  Short Term Goals: Week 2:  PT Short Term Goal 1 (Week 2): Pt will consistently perform transfers with +1 assist PT Short Term Goal 2 (Week 2): Pt will follow 1 step commands 50% of the time  Skilled Therapeutic Interventions/Progress Updates:  Treatment 1: Pt received in bed and with behaviors noting stomach pain, RN made aware. Provided pt with cushion for 20x18 w/c. Pt transferred supine>sitting EOB with min assist and tactile cuing for initiation as pt unable to do so. While sitting EOB pt doffed shirt and donned clean one; pt able to initiate task with min cuing. Assisted pt with stand pivot bed>w/c with max assist; pt requires more than reasonable amount of time to initiate task and max assist for balance during movement 2/2 posterior lean. At end of session pt left sitting in standard w/c with BUE mittens donned & QRB applied; pt left at nurses station.   Treatment 2: Pt received in bed. Pt rubbing BLE knees during session & RN made aware of pt's potential knee pain. Pt required min assist to transfer BLE to EOB but pt eventually able to initiate sidelying>sitting by pushing up on bedrails with supervision. Provided pt with shoes & pt able to initiate donning them with significantly extra time and verbal cuing. Pt required total assist for tying laces. Pt completed sit>stand and stand pivot bed>w/c with mod assist; therapist provided manual facilitation for weight shifting and cuing for sequencing. At sink pt able to maintain standing with steady assist to allow therapist to perform peri-hygiene and change brief total assist (small, incontinent BM). Transported pt to gym and pt ambulated 70 ft with mod assist with therapist providing manual facilitation for  weight shifting & LE advancement. Pt ambulates with narrow base of support and step-to gait pattern (leading with RLE), and will attempt to reach for rail in hallway for support. Attempted to engage pt in writing name or drawing shapes on dry erase board but pt with poor attention to task. At end of session pt left sitting in TIS w/c with QRB donned, in room, with family present to supervise.  Pt nonverbal throughout both treatment sessions on this date despite encouragement to verbally communicate.  Therapy Documentation Precautions:  Precautions Precautions: Fall, Cervical Precaution Comments: trach and peg, trach collar vs vent  Restrictions Weight Bearing Restrictions: No   See Function Navigator for Current Functional Status.   Therapy/Group: Individual Therapy  Sandi MariscalVictoria M Kionte Baumgardner 08/28/2016, 5:20 PM
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
## 2016-08-28 NOTE — Progress Notes (Signed)
Occupational Therapy Session Note  Patient Details  Name: [name] MRN: [id number] Date of Birth: 13-Aug-1980  Today's Date: 08/28/2016 OT Individual Time: 0900-1030 OT Individual Time Calculation (min): 90 min    Short Term Goals: Week 2:  OT Short Term Goal 1 (Week 2): Pt will complete 2 of 5 grooming tasks with setup, min vc and hand guidance to initiate task. OT Short Term Goal 2 (Week 2): Pt will participate in select activity, sustaining focused attention for 5 minutes during functional task. OT Short Term Goal 3 (Week 2): Pt will thread BLE into pants with mn verbal cues OT Short Term Goal 4 (Week 2): Pt will initiate bathing tasks with min verbal cues  OT Short Term Goal 5 (Week 2): Pt will perform toilet transfers with mod A +1  Skilled Therapeutic Interventions/Progress Updates:    Pt resting in w/c upon arrival.  Pt incontinent of bowel (pants and shirt soiled). Pt stood in shower for therapist and RN to clean pt thoroughly.  Pt performed sit<>stand X 4 with min A and steady A for standing balance while washing off.  Pt donned shirt without assistance when supplied.  Pt did not have any clean pants and donned paper scrub pants.  While seated pt removed scrub pants and would not don them again.  Hospital gown provided.  Pt initiates transitional movements with simple commands and extra time to initiate.  Pt remains non verbal.  Focus on sit<>stand, transitional movements, task initiation, sequencing, attention to task, cognitive remediation, and BADL retaining to increase independence with BADLs.   Therapy Documentation Precautions:  Precautions Precautions: Fall, Cervical Precaution Comments: trach and peg, trach collar vs vent  Restrictions Weight Bearing Restrictions: No  Pain: Pain Assessment Pain Assessment: Faces Faces Pain Scale: No hurt  See Function Navigator for Current Functional Status.   Therapy/Group: Individual Therapy  Rich BraveLanier, Angus Amini  Chappell 08/28/2016, 10:33 AM

## 2016-08-28 NOTE — Progress Notes (Signed)
Speech Language Pathology Daily Session Note  Patient Details  Name: [name] MRN: [id number] Date of Birth: 02/08/1981  Today's Date: 08/28/2016 SLP Individual Time: 1100-1155 SLP Individual Time Calculation (min): 55 min  Short Term Goals: Week 2: SLP Short Term Goal 1 (Week 2): Patient will follow 1 step commands in 75% of opportunities with Max A multimodal cues.  SLP Short Term Goal 2 (Week 2): Patient will verbalize at the word level in 25% of opportunities with Max A multimodal cues.  SLP Short Term Goal 3 (Week 2): Patient will demonstrate focused attention to task for ~60 seconds with Max A multimodal cues.  SLP Short Term Goal 4 (Week 2): Patient will consume trials of ice chips with Min assist multimodal cues for initiation of swallow sequence with overt s/s of aspiraiton in less then 25% of trials to demonstrate readiness for an objective swallow assessment  SLP Short Term Goal 5 (Week 2): Patient will initiate functional tasks with Max A verbal cues in 50% of opportunities   Skilled Therapeutic Interventions: Skilled treatment session focused on cognitive-linguistic and dysphagia goals. SLP facilitated session by providing Max A tactile cues for initiation of oral care via the suction toothbrush. However, patient sustained attention to task for ~90 seconds with Mod I. Patient consumed minimal trials of ice chips this session with minimal mastication and a suspected delayed swallow initiation. However, patient did not demonstrate any overt s/s of aspiration. Patient able to shrug his shoulders and nod his head "no" in response to question X 1 throughout session. Patient did not initiate bsaic sorting task despite Max A multimodal cues. Patient with increased restlessness towards end of session. Patient transferred to commode with extra time and Max A verbal cues for initiation but was continent of bowel. Patient then transferred to bed at end of session. Patient left supine in bed  with alarm on. Continue with current plan of care.      Function:  Eating Eating   Modified Consistency Diet: Yes Eating Assist Level: Hand over hand assist           Cognition Comprehension Comprehension assist level: Understands basic 25 - 49% of the time/ requires cueing 50 - 75% of the time  Expression   Expression assist level: Expresses basis less than 25% of the time/requires cueing >75% of the time.  Social Interaction Social Interaction assist level: Interacts appropriately less than 25% of the time. May be withdrawn or combative.  Problem Solving Problem solving assist level: Solves basic less than 25% of the time - needs direction nearly all the time or does not effectively solve problems and may need a restraint for safety  Memory Memory assist level: Recognizes or recalls 25 - 49% of the time/requires cueing 50 - 75% of the time    Pain No indications of pain   Therapy/Group: Individual Therapy  Jiayi Lengacher 08/28/2016, 3:11 PM
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
## 2016-08-28 NOTE — Progress Notes (Addendum)
Occupational Therapy Session Note  Patient Details  Name: Elmon ElseDarren T Moorehouse MRN: [id number] Date of Birth: 1981/04/07  Today's Date: 08/28/2016 OT Individual Time: 1454-1540 OT Individual Time Calculation (min): 46 min   Skilled Therapeutic Interventions/Progress Updates: Pt was sitting in w/c with family present at time of arrival. Tx focus on cognitive skills during ADL tasks. Oral care completed w/c level at sink with extra time for processing and max multimodal cues for initiation, sequencing, and termination of task. He was able to bring toothbrush to mouth, brush teeth, and spit into sink. Afterwards, had pt try toileting per RN request. Max multimodal cues for pt to complete SPT to toilet. Pt responded well to having therapist lightly grip under arm and guide forward with Min A. Once he was standing with grab bars, pt began exhibiting inappropriate sexual behaviors and could not be redirected to toileting task. OT redirected pt back safely to w/c. Safety belt was donned and pt was taken to RN station. RN informed of pts behaviors in bathroom with verbalized understanding.      Therapy Documentation Precautions:  Precautions Precautions: Fall, Cervical Precaution Comments: trach and peg, trach collar vs vent  Restrictions Weight Bearing Restrictions: No   Vital Signs: Therapy Vitals Temp: 98.4 F (36.9 C) Temp Source: Oral Pulse Rate: 92 Resp: 17 BP: 115/72 Patient Position (if appropriate): Sitting Oxygen Therapy SpO2: 100 % O2 Device: Not Delivered Pain: No s/s pain during session  Pain Assessment Pain Score: 4  ADL: ADL ADL Comments: see Functional Assessment Tool    See Function Navigator for Current Functional Status.   Therapy/Group: Individual Therapy  Hedda Crumbley A Eniola Cerullo 08/28/2016, 4:29 PM

## 2016-08-29 ENCOUNTER — Inpatient Hospital Stay (HOSPITAL_COMMUNITY): Payer: Medicaid Other | Admitting: Physical Therapy

## 2016-08-29 ENCOUNTER — Inpatient Hospital Stay (HOSPITAL_COMMUNITY): Payer: Self-pay | Admitting: *Deleted

## 2016-08-29 ENCOUNTER — Inpatient Hospital Stay (HOSPITAL_COMMUNITY): Payer: Medicaid Other | Admitting: Speech Pathology

## 2016-08-29 ENCOUNTER — Inpatient Hospital Stay (HOSPITAL_COMMUNITY): Payer: Self-pay | Admitting: Occupational Therapy

## 2016-08-29 DIAGNOSIS — R451 Restlessness and agitation: Secondary | ICD-10-CM

## 2016-08-29 DIAGNOSIS — G479 Sleep disorder, unspecified: Secondary | ICD-10-CM

## 2016-08-29 LAB — GLUCOSE, CAPILLARY
Glucose-Capillary: 85 mg/dL (ref 65–99)
Glucose-Capillary: 89 mg/dL (ref 65–99)
Glucose-Capillary: 110 mg/dL — ABNORMAL HIGH (ref 65–99)
Glucose-Capillary: 155 mg/dL — ABNORMAL HIGH (ref 65–99)

## 2016-08-29 MED ORDER — LEVETIRACETAM 100 MG/ML PO SOLN
250.0000 mg | Freq: Two times a day (BID) | ORAL | Status: DC
  Administered 2016-08-29 – 2016-09-11 (×26): 250 mg
  Filled 2016-08-29 (×27): qty 5

## 2016-08-29 NOTE — Progress Notes (Signed)
Speech Language Pathology Daily Session Note  Patient Details  Name: [name] MRN: [id number] Date of Birth: 03-May-1981  Today's Date: 08/29/2016 SLP Individual Time: 0900-1000 SLP Individual Time Calculation (min): 60 min  Short Term Goals: Week 2: SLP Short Term Goal 1 (Week 2): Patient will follow 1 step commands in 75% of opportunities with Max A multimodal cues.  SLP Short Term Goal 2 (Week 2): Patient will verbalize at the word level in 25% of opportunities with Max A multimodal cues.  SLP Short Term Goal 3 (Week 2): Patient will demonstrate focused attention to task for ~60 seconds with Max A multimodal cues.  SLP Short Term Goal 4 (Week 2): Patient will consume trials of ice chips with Min assist multimodal cues for initiation of swallow sequence with overt s/s of aspiraiton in less then 25% of trials to demonstrate readiness for an objective swallow assessment  SLP Short Term Goal 5 (Week 2): Patient will initiate functional tasks with Max A verbal cues in 50% of opportunities   Skilled Therapeutic Interventions:Skilled treatment session focused on cognitive-linguistic and dysphagia goals. SLP facilitated session by providing Max A tactile cues for initiation of oral care via the suction toothbrush. However, patient sustained attention to task for ~90 seconds with Mod I. Patient consumed trials of ice chips with minimally improved mastication with a suspected delayed swallow initiation. However, patient did not demonstrate any overt s/s of aspiration. Patient required more than a reasonable amount of time for initiation of trials despite Max A multimodal cues. Patient did not initiate basic sorting task despite Max A multimodal cues and appeared distracted by his hair. Patient with increased restlessness towards end of session. Patient transferred to commode with extra time and Min A verbal cues for initiation. Patient was continent of bowel and bladder. Patient then transferred back to  the wheelchair and handed off to PT. Continue with current plan of care.          Function:  Eating Eating Eating activity did not occur: Safety/medical concerns Modified Consistency Diet: Yes Eating Assist Level: Hand over hand assist   Eating Set Up Assist For: Opening containers Helper Scoops Food on Utensil: Occasionally Helper Brings Food to Mouth: Occasionally   Cognition Comprehension Comprehension assist level: Understands basic 25 - 49% of the time/ requires cueing 50 - 75% of the time  Expression   Expression assist level: Expresses basis less than 25% of the time/requires cueing >75% of the time.  Social Interaction Social Interaction assist level: Interacts appropriately less than 25% of the time. May be withdrawn or combative.  Problem Solving Problem solving assist level: Solves basic less than 25% of the time - needs direction nearly all the time or does not effectively solve problems and may need a restraint for safety  Memory Memory assist level: Recognizes or recalls 25 - 49% of the time/requires cueing 50 - 75% of the time    Pain No indications of pain   Therapy/Group: Individual Therapy  Erice Ahles 08/29/2016, 11:49 AM
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
## 2016-08-29 NOTE — Progress Notes (Signed)
Occupational Therapy Session Note  Patient Details  Name: [name] MRN: [id number] Date of Birth: 1981-01-21  Today's Date: 08/29/2016 OT Individual Time: 0700-0825 OT Individual Time Calculation (min): 85 min    Short Term Goals: Week 2:  OT Short Term Goal 1 (Week 2): Pt will complete 2 of 5 grooming tasks with setup, min vc and hand guidance to initiate task. OT Short Term Goal 2 (Week 2): Pt will participate in select activity, sustaining focused attention for 5 minutes during functional task. OT Short Term Goal 3 (Week 2): Pt will thread BLE into pants with mn verbal cues OT Short Term Goal 4 (Week 2): Pt will initiate bathing tasks with min verbal cues  OT Short Term Goal 5 (Week 2): Pt will perform toilet transfers with mod A +1  Skilled Therapeutic Interventions/Progress Updates:    Pt engaged in BADL retraining including bathing at shower level and dressing with sit<>stand from w/c at sink.  Pt required min A with max multimodal cues for sit<>stand and stand step transfer to tub bench in shower.  Pt continues to require max multimodal cues to initiate bathing tasks and assistance to complete tasks and for thoroughness.  Pt dons his shirt when presented to him and initiates threading his pants but requires assistance to complete task.  Pt required tot A for donning socks and shoes this morning.  Pt transitioned to gym.  Attempted to engage pt in catching and tossing beach ball.  Pt did not initiate catching or tossing ball back to therapist.  Attempted to engage in following one step commands selecting colored bean bags.  Pt returned to nursing station in w/c with QRB in place.   Therapy Documentation Precautions:  Precautions Precautions: Fall, Cervical Precaution Comments: trach and peg, trach collar vs vent  Restrictions Weight Bearing Restrictions: No   Pain:  Pt with no s/s of pain  See Function Navigator for Current Functional Status.   Therapy/Group: Individual  Therapy  Rich BraveLanier, Adaisha Campise Chappell 08/29/2016, 8:51 AM

## 2016-08-29 NOTE — Progress Notes (Signed)
Occupational Therapy Note  Patient Details  Name: [name] MRN: [id number] Date of Birth: Feb 23, 1981  Today's Date: 08/29/2016 OT Individual Time: 1300-1400 OT Individual Time Calculation (min): 60 min   Pt denied pain and with no s/s of pain Individual Therapy:  Focus on assessment and treatment of vision and sustained attention including visual scanning and reaching for target in all fields using [employer]. Pt's right eye doesn't track laterally (outward and downward) resulting in malalinement with scanning. Pt also missed targets on the right and required increased time to hit target. Pt required 100% tactile cues for initiation to hit all targets.  Also noted that pt undershoots all targets in all fields.    [facility] to use toilet.  Pt required steady A/close supervision during ambulation.  Pt noted with slight rocking/side-to-side motion when walking.  Pt required steady A when turning/changing direction.  Pt opened bathroom door and positioned appropriately at toilet with min verbal cues.  Pt able to lower to toilet with supervision and use of grab bars.  [facility] to initiate cleaning self with wash cloth after bowel movement.  Pt requires assistance to terminate tasks.  Pt undershoots objects when reaching.  Pt amb back to w/c with occasional steady A for LOB. Pt remained in w/c with fiancee present.    [name], [name] [facility] LLCChappell 08/29/2016, 2:20 PM

## 2016-08-29 NOTE — Progress Notes (Signed)
Physical Therapy Session Note  Patient Details  Name: [name] MRN: [id number] Date of Birth: [birth year]  Today's Date: 08/29/2016 PT Individual Time: 1003-1102 PT Individual Time Calculation (min): 59 min   Short Term Goals: Week 2:  PT Short Term Goal 1 (Week 2): Pt will consistently perform transfers with +1 assist PT Short Term Goal 2 (Week 2): Pt will follow 1 step commands 50% of the time  Skilled Therapeutic Interventions/Progress Updates:  Pt received in w/c in handoff from SLP; pt without behaviors demonstrating pain. Session focused on cognitive remediation (attention, initiation) and functional mobility. Pt ambulated 150 ft + 100 ft with mod assist + 1 with therapist providing manual facilitation for weight shifting L/R and cuing to take steps. Pt able to progress from step-to gait pattern to step through. In gym attempted to engage pt in shooting basketball while standing but pt easily distracted; transitioned to day room & attempted to engage pt in throwing ball; pt able to raise ball up but unable to initiate tossing. Also attempted to engage pt in conversation, pt only nods head yes, with no verbal communication during session. Attempted to engage pt in identifying and sorting coins and obtaining cups; pt with poor attention to task and unable to initiate activities. At end of session pt left sitting in TIS w/c with QRB donned & at nurses station.   Therapy Documentation Precautions:  Precautions Precautions: Fall, Cervical Precaution Comments: trach and peg, trach collar vs vent  Restrictions Weight Bearing Restrictions: No   See Function Navigator for Current Functional Status.   Therapy/Group: Individual Therapy  Sandi MariscalVictoria M Zetha Kuhar 08/29/2016, 12:00 PM

## 2016-08-30 ENCOUNTER — Inpatient Hospital Stay (HOSPITAL_COMMUNITY): Payer: Self-pay | Admitting: *Deleted

## 2016-08-30 ENCOUNTER — Inpatient Hospital Stay (HOSPITAL_COMMUNITY): Payer: Self-pay | Admitting: Physical Therapy

## 2016-08-30 ENCOUNTER — Inpatient Hospital Stay (HOSPITAL_COMMUNITY): Payer: Medicaid Other | Admitting: Physical Therapy

## 2016-08-30 ENCOUNTER — Inpatient Hospital Stay (HOSPITAL_COMMUNITY): Payer: Medicaid Other | Admitting: Speech Pathology

## 2016-08-30 ENCOUNTER — Inpatient Hospital Stay (HOSPITAL_COMMUNITY): Payer: Self-pay

## 2016-08-30 DIAGNOSIS — Z931 Gastrostomy status: Secondary | ICD-10-CM

## 2016-08-30 LAB — GLUCOSE, CAPILLARY
GLUCOSE-CAPILLARY: 57 mg/dL — AB (ref 65–99)
GLUCOSE-CAPILLARY: 111 mg/dL — AB (ref 65–99)
GLUCOSE-CAPILLARY: 134 mg/dL — AB (ref 65–99)
GLUCOSE-CAPILLARY: 142 mg/dL — AB (ref 65–99)
Glucose-Capillary: 106 mg/dL — ABNORMAL HIGH (ref 65–99)
Glucose-Capillary: 185 mg/dL — ABNORMAL HIGH (ref 65–99)
Glucose-Capillary: 63 mg/dL — ABNORMAL LOW (ref 65–99)
Glucose-Capillary: 91 mg/dL (ref 65–99)

## 2016-08-30 MED ORDER — QUETIAPINE FUMARATE 50 MG PO TABS
50.0000 mg | ORAL_TABLET | Freq: Every day | ORAL | Status: DC
  Administered 2016-08-30: 50 mg
  Filled 2016-08-30: qty 1

## 2016-08-30 NOTE — Progress Notes (Signed)
Occupational Therapy Note  Patient Details  Name: Elmon ElseDarren T Kinkaid MRN: [id number] Date of Birth: 07-21-1980  Today's Date: 08/30/2016 OT Individual Time: 1130-1200 OT Individual Time Calculation (min): 30 min   Pt with no s/s of pain [facility]  Attempted to engage pt in pipe tree activity.  HOH assistance provided to initiate assembly of structure.  Pt would deconstruct structure but did not actively initiate/engage in assembly.  Pt began pulling at his pants and transitioned to bathroom in room.  Pt amb (steady A) into bathroom and sat on toilet to void. Pt Pt stood with min A and pulled up pants before amb to w/c.  Pt remained in w/c with QRB in place and fiancee present.   Lavone NeriLanier, Tiyana Galla [facility] 08/30/2016, 12:03 PM

## 2016-08-30 NOTE — Progress Notes (Signed)
Physical Therapy Session Note  Patient Details  Name: [name] MRN: [id number] Date of Birth: 1981/05/07  Today's Date: 08/30/2016 PT Individual Time: [id number] PT Individual Time Calculation (min): 27 min   Short Term Goals: Week 2:  PT Short Term Goal 1 (Week 2): Pt will consistently perform transfers with +1 assist PT Short Term Goal 2 (Week 2): Pt will follow 1 step commands 50% of the time  Skilled Therapeutic Interventions/Progress Updates:    Pt received in handoff from NT. Pt without behaviors demonstrating pain during session. Pt had just completed toileting and was set up at sink to wash hands. [facility] to initiate washing hands and after therapist applied soap pt able to scrub hands. Pt required total assist to terminate task and initiate drying hands. Transported pt to rehab gym via w/c total assist. Pt ambulated 100 ft with min assist and negotiated 12 steps (6" + 3") with B rails and min assist. Pt with improving ability to initiate sit>stand with less verbal/tacticle cuing on this date. Engaged pt in tossing beach ball with therapist; pt able to hand ball to therapist but only able to toss it x 1 time. Pt repeatedly would initiate movement to throw ball but then unable to follow through with task. At end of session pt left sitting in w/c with QRB & mittens donned; pt at nurses station.  Therapy Documentation Precautions:  Precautions Precautions: Fall, Cervical Precaution Comments: trach and peg, trach collar vs vent  Restrictions Weight Bearing Restrictions: No   See Function Navigator for Current Functional Status.   Therapy/Group: Individual Therapy  [name] 08/30/2016, 5:13 PM
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
## 2016-08-30 NOTE — Progress Notes (Signed)
Occupational Therapy Session Note  Patient Details  Name: [name] Labrum MRN: [id number] Date of Birth: August 25, 1980  Today's Date: 08/30/2016 OT Individual Time: 0700-0825 OT Individual Time Calculation (min): 85 min    Short Term Goals: Week 2:  OT Short Term Goal 1 (Week 2): Pt will complete 2 of 5 grooming tasks with setup, min vc and hand guidance to initiate task. OT Short Term Goal 2 (Week 2): Pt will participate in select activity, sustaining focused attention for 5 minutes during functional task. OT Short Term Goal 3 (Week 2): Pt will thread BLE into pants with mn verbal cues OT Short Term Goal 4 (Week 2): Pt will initiate bathing tasks with min verbal cues  OT Short Term Goal 5 (Week 2): Pt will perform toilet transfers with mod A +1  Skilled Therapeutic Interventions/Progress Updates:    Pt engaged in BADL retraining including bathing at shower level and dressing with sit<>stand from w/c at sink. Focus on task initiation, sequencing, termination of task, sit<>stand, standing balance, and safety awareness.  Pt continues to require multimodal cues for sequencing and task initiation.  Pt was more thorough during bathing tasks this morning but continues to require multimodal cues to terminate tasks without a logical ending (ie, washing legs, arms, etc.). Pt performs sit<>stand with min A and ambs with HHA at steady A level.  Pt donned socks and shoes this morning with multimodal cues to initiate task.  Pt remained in w/c with QRB in place and stepfather present.   Therapy Documentation Precautions:  Precautions Precautions: Fall, Cervical Precaution Comments: trach and peg, trach collar vs vent  Restrictions Weight Bearing Restrictions: No  Pain:  Pt with no s/s of pain  See Function Navigator for Current Functional Status.   Therapy/Group: Individual Therapy  Rich BraveLanier, Dhamar Gregory Chappell 08/30/2016, 8:26 AM

## 2016-08-30 NOTE — Progress Notes (Signed)
Physical Therapy Session Note  Patient Details  Name: [name] MRN: [id number] Date of Birth: 06/23/1981  Today's Date: 08/30/2016 PT Individual Time: 0900-1020 PT Individual Time Calculation (min): 80 min   Short Term Goals: Week 1:  PT Short Term Goal 1 (Week 1): Pt will perform functional transfers with +1 assist PT Short Term Goal 1 - Progress (Week 1): Progressing toward goal PT Short Term Goal 2 (Week 1): Pt will consistently follow 1 step commands 50% of the time PT Short Term Goal 2 - Progress (Week 1): Progressing toward goal Week 2:  PT Short Term Goal 1 (Week 2): Pt will consistently perform transfers with +1 assist PT Short Term Goal 2 (Week 2): Pt will follow 1 step commands 50% of the time  Skilled Therapeutic Interventions/Progress Updates:   Session focused on sustained and selective attention, initiation, following one step instructions.  Pt received sitting in WC and agreeable to PT  Gait training through hall in hospital x 258f, [id number] and [id number] min assist from PT for safety to prevent L lateral LOB. Patient noted to have increased lateral sway to R and L throughout gait, and attempted to use L rail in hall whenever possible. Throughout gait, patient also pulling pants down and required constant cues to prevent pulling pants below waist.   Pt assumed to be indicating that he needed to use restroom by constantly pulling pants down and assist to toilet twice. On first visit to restroom, patient successfully voided after prolonged break for encouragement to have bowel movement.    PT instructed patient in sustained attention task of nustep endurance training for 2 bouts of 5 minutes and 3 minutes. Patient required several redirections to help patient to continue task and stopping pulling pants down.  PT instructed patient in ball toss activity x 10 minutes from sitting position. PT progressed from beach ball to volleyball due to increased attention to  task by patient and alertness to catch ball. For last 4 minutes, patient threw ball to empty chair to L of therapist, and would not re-adjust with cues.   Patient returned to room and [facility] in reach and all needs met.        Therapy Documentation Precautions:  Precautions Precautions: Fall, Cervical Precaution Comments: trach and peg, trach collar vs vent  Restrictions Weight Bearing Restrictions: No General: PT Amount of Missed Time (min): 10 Minutes PT Missed Treatment Reason: Patient fatigue Vital Signs: Therapy Vitals Temp: 98.6 F (37 C) Temp Source: Oral Pulse Rate: 80 BP: 128/78 Patient Position (if appropriate): Sitting Oxygen Therapy SpO2: 100 % O2 Device: Not Delivered Pain: Pain Assessment Pain Assessment: Faces Faces Pain Scale: No hurt   See Function Navigator for Current Functional Status.   Therapy/Group: Individual Therapy  AustLorie Phenix8/2018, 1:59 PM

## 2016-08-30 NOTE — Progress Notes (Signed)
Speech Language Pathology Daily Session Note  Patient Details  Name: [name] MRN: [id number] Date of Birth: Aug 18, 1980  Today's Date: 08/30/2016 SLP Individual Time: 1345-1430 SLP Individual Time Calculation (min): 45 min  Short Term Goals: Week 2: SLP Short Term Goal 1 (Week 2): Patient will follow 1 step commands in 75% of opportunities with Max A multimodal cues.  SLP Short Term Goal 2 (Week 2): Patient will verbalize at the word level in 25% of opportunities with Max A multimodal cues.  SLP Short Term Goal 3 (Week 2): Patient will demonstrate focused attention to task for ~60 seconds with Max A multimodal cues.  SLP Short Term Goal 4 (Week 2): Patient will consume trials of ice chips with Min assist multimodal cues for initiation of swallow sequence with overt s/s of aspiraiton in less then 25% of trials to demonstrate readiness for an objective swallow assessment  SLP Short Term Goal 5 (Week 2): Patient will initiate functional tasks with Max A verbal cues in 50% of opportunities   Skilled Therapeutic Interventions: Skilled treatment session focused on cognitive and dysphagia goals. SLP facilitated session by providing overall Max A verbal and visual cues for initiation with self-feeding of ice chips, however, demonstrated increased attention to task and consumed trials X 2 with only Min A verbal cues for initiation.  Patient consumed ice chips without overt s/s of aspiration and demonstrated increased mastication. Patient continues to be internally distracted with constant "bobbing" of his head to a rhythm and fixing his hair. Patient with increased restlessness at end of session with attempting to take off his shirt and quick release belt. Patient transferred to the commode with Max A multimodal cues for initiation but removed his shirt prior to sitting on commode and folded it neatly. After bowel movement, patient agreeable to putting shirt back on. Patient left upright at RN station  with quick release belt in place. Continue with current plan of care.      Function:  Eating Eating Eating activity did not occur:  (with trials from SLP) Modified Consistency Diet: Yes Eating Assist Level: Hand over hand assist           Cognition Comprehension Comprehension assist level: Understands basic 25 - 49% of the time/ requires cueing 50 - 75% of the time  Expression   Expression assist level: Expresses basis less than 25% of the time/requires cueing >75% of the time.  Social Interaction Social Interaction assist level: Interacts appropriately less than 25% of the time. May be withdrawn or combative.  Problem Solving Problem solving assist level: Solves basic less than 25% of the time - needs direction nearly all the time or does not effectively solve problems and may need a restraint for safety  Memory Memory assist level: Recognizes or recalls less than 25% of the time/requires cueing greater than 75% of the time    Pain Pain Assessment Pain Assessment: No/denies pain Faces Pain Scale: No hurt  Therapy/Group: Individual Therapy  Sincerity Cedar 08/30/2016, 3:08 PM

## 2016-08-31 ENCOUNTER — Inpatient Hospital Stay (HOSPITAL_COMMUNITY): Payer: Medicaid Other | Admitting: Speech Pathology

## 2016-08-31 ENCOUNTER — Inpatient Hospital Stay (HOSPITAL_COMMUNITY): Payer: Medicaid Other | Admitting: Occupational Therapy

## 2016-08-31 ENCOUNTER — Inpatient Hospital Stay (HOSPITAL_COMMUNITY): Payer: Self-pay

## 2016-08-31 ENCOUNTER — Inpatient Hospital Stay (HOSPITAL_COMMUNITY): Payer: Self-pay | Admitting: Physical Therapy

## 2016-08-31 DIAGNOSIS — R739 Hyperglycemia, unspecified: Secondary | ICD-10-CM

## 2016-08-31 LAB — GLUCOSE, CAPILLARY
GLUCOSE-CAPILLARY: 122 mg/dL — AB (ref 65–99)
GLUCOSE-CAPILLARY: 138 mg/dL — AB (ref 65–99)
GLUCOSE-CAPILLARY: 186 mg/dL — AB (ref 65–99)
GLUCOSE-CAPILLARY: 75 mg/dL (ref 65–99)
Glucose-Capillary: 129 mg/dL — ABNORMAL HIGH (ref 65–99)
Glucose-Capillary: 117 mg/dL — ABNORMAL HIGH (ref 65–99)

## 2016-08-31 MED ORDER — QUETIAPINE FUMARATE 25 MG PO TABS
25.0000 mg | ORAL_TABLET | Freq: Every day | ORAL | Status: DC
  Administered 2016-08-31 – 2016-09-02 (×3): 25 mg
  Filled 2016-08-31 (×3): qty 1
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
## 2016-08-31 NOTE — Progress Notes (Signed)
Occupational Therapy Session Note  Patient Details  Name: [name] MRN: [id number] Date of Birth: Feb 26, 1981  Today's Date: 08/31/2016 OT Individual Time: 1101-1200 OT Individual Time Calculation (min): 59 min    Short Term Goals: Week 2:  OT Short Term Goal 1 (Week 2): Pt will complete 2 of 5 grooming tasks with setup, min vc and hand guidance to initiate task. OT Short Term Goal 2 (Week 2): Pt will participate in select activity, sustaining focused attention for 5 minutes during functional task. OT Short Term Goal 3 (Week 2): Pt will thread BLE into pants with mn verbal cues OT Short Term Goal 4 (Week 2): Pt will initiate bathing tasks with min verbal cues  OT Short Term Goal 5 (Week 2): Pt will perform toilet transfers with mod A +1  Skilled Therapeutic Interventions/Progress Updates:    Pt received from RN session with no signs or symptoms of pain this session. Pt needing assistance to cross ankle to opposite knee  but able to don shoes and tie laces with mod demonstrational and verbal cues. Pt engaged in bean bag toss activity where he was able to initiate tossing 3 bags with increased time of 5 min, 3 min, and 2 min respectively. Pt performed sit <>stand with min tactile cue and ambulated to bathroom for toileting needs. Pt pulling down pants with increased time and cues. Pt sitting on toilet to have BM and urinate. Pt shaking head "yes" when asked if finished and OT placed toilet paper in hand and pt initiated hygiene on his own. Pt returning to wheelchair at end of session. Quick release belt donned and pt returned to RN station.   Therapy Documentation Precautions:  Precautions Precautions: Fall, Cervical Precaution Comments: trach and peg, trach collar vs vent  Restrictions Weight Bearing Restrictions: No General:   Vital Signs:   Pain: Pain Assessment Faces Pain Scale: No hurt ADL: ADL ADL Comments: see Functional Assessment Tool Exercises:   Other Treatments:     See Function Navigator for Current Functional Status.   Therapy/Group: Individual Therapy  [name], Alise Calais P 08/31/2016, 12:54 PM

## 2016-08-31 NOTE — Progress Notes (Signed)
Speech Language Pathology Daily Session Note  Patient Details  Name: [name] MRN: [id number] Date of Birth: 1981/01/24  Today's Date: 08/31/2016 SLP Individual Time: 0900-0930 SLP Individual Time Calculation (min): 30 min  Short Term Goals: Week 2: SLP Short Term Goal 1 (Week 2): Patient will follow 1 step commands in 75% of opportunities with Max A multimodal cues.  SLP Short Term Goal 2 (Week 2): Patient will verbalize at the word level in 25% of opportunities with Max A multimodal cues.  SLP Short Term Goal 3 (Week 2): Patient will demonstrate focused attention to task for ~60 seconds with Max A multimodal cues.  SLP Short Term Goal 4 (Week 2): Patient will consume trials of ice chips with Min assist multimodal cues for initiation of swallow sequence with overt s/s of aspiraiton in less then 25% of trials to demonstrate readiness for an objective swallow assessment  SLP Short Term Goal 5 (Week 2): Patient will initiate functional tasks with Max A verbal cues in 50% of opportunities   Skilled Therapeutic Interventions: Skilled treatment session focused on cognitive goals. SLP facilitated session by providing Max A verbal cues for initiation of a basic sorting task from a field of 2. Patient unable to perform task accurately. Patient demonstrated what appeared to be increased oral movements in response to basic biographical questions, but unable to vocalize except while spontaneously belching. Patient left upright at RN station with quick release belt in place. Continue with current plan of care.      Function:  Cognition Comprehension Comprehension assist level: Understands basic 25 - 49% of the time/ requires cueing 50 - 75% of the time  Expression   Expression assist level: Expresses basis less than 25% of the time/requires cueing >75% of the time.  Social Interaction Social Interaction assist level: Interacts appropriately less than 25% of the time. May be withdrawn or combative.   Problem Solving Problem solving assist level: Solves basic less than 25% of the time - needs direction nearly all the time or does not effectively solve problems and may need a restraint for safety  Memory Memory assist level: Recognizes or recalls less than 25% of the time/requires cueing greater than 75% of the time    Pain Pain Assessment Faces Pain Scale: No hurt  Therapy/Group: Individual Therapy  Murad Staples 08/31/2016, 3:06 PM

## 2016-08-31 NOTE — Progress Notes (Signed)
Occupational Therapy Session Note  Patient Details  Name: [name] MRN: [id number] Date of Birth: 24-Jun-1981  Today's Date: 08/31/2016 OT Individual Time: 1015-1100 OT Individual Time Calculation (min): 45 min    Short Term Goals: Week 1:  OT Short Term Goal 1 (Week 1): Patient will complete transfer from EOB to w/c in prep for BADL with mod A X 1 OT Short Term Goal 1 - Progress (Week 1): Met OT Short Term Goal 2 (Week 1): Pt will complete 2 of 5 grooming tasks with setup, min vc and hand guidance to initiate task. OT Short Term Goal 2 - Progress (Week 1): Progressing toward goal OT Short Term Goal 3 (Week 1): Pt will maintian static stanidng balance with min assist while assisted with lower body dressing task with  OT Short Term Goal 3 - Progress (Week 1): Met OT Short Term Goal 4 (Week 1): Pt will participate in select activity, sustaining focused attention for 5 minutes during functional task. OT Short Term Goal 4 - Progress (Week 1): Progressing toward goal Week 2:  OT Short Term Goal 1 (Week 2): Pt will complete 2 of 5 grooming tasks with setup, min vc and hand guidance to initiate task. OT Short Term Goal 2 (Week 2): Pt will participate in select activity, sustaining focused attention for 5 minutes during functional task. OT Short Term Goal 3 (Week 2): Pt will thread BLE into pants with mn verbal cues OT Short Term Goal 4 (Week 2): Pt will initiate bathing tasks with min verbal cues  OT Short Term Goal 5 (Week 2): Pt will perform toilet transfers with mod A +1  Skilled Therapeutic Interventions/Progress Updates:    Pt seen this session to facilitate cognitive skills of initiation, motor planning, and decreasing motor perseveration. Pt received in chair pulling on his quick release belt. Pt taken to bathroom and completed stand pivot with grab bar to toilet with steadying A and then toileting and then washing hands with S/Cues.  For all 3 tasks, pt needed extra time to respond to 1  step commands, cues to complete task fully, and cues to stop task. Pt demonstrated perseveration with cleansing self and washing hands. Non verbal but did respond with head nods.   Pt taken to quiet room to facilitate R visual scanning with simple 1 step activity of flipping over colored disks, pt was engaging in activity but not in a purposeful manner.  Attempted to have pt point to letter D out of 6 letter tiles that spelled his name.  Pt unable to engage in activity. Pt taken back to nursing station with quick release belt on.  Therapy Documentation Precautions:  Precautions Precautions: Fall, Cervical Precaution Comments: trach and peg, trach collar vs vent  Restrictions Weight Bearing Restrictions: No    Vital Signs:   Pain: Pain Assessment Faces Pain Scale: No hurt ADL: ADL ADL Comments: see Functional Assessment Tool   See Function Navigator for Current Functional Status.   Therapy/Group: Individual Therapy  [name] 08/31/2016, 12:51 PM

## 2016-08-31 NOTE — Progress Notes (Signed)
Occupational Therapy Note  Patient Details  Name: [name] MRN: [id number] Date of Birth: [birth year]  Today's Date: 08/31/2016 OT Individual Time: 1400-1430 OT Individual Time Calculation (min): 30 min   Pt with no s/s of pain Individual Therapy  Pt resting in w/c upon arrival with RN present.  Pt initially engaged in [employer] activity with focus on scanning and task initiation.  [facility] required [facility] assistance to touch target light and progressed to initiating task with minimal tactile cue to engage.  Pt missed target to right or left approx 75% of time in all quadrants.  Pt transitioned to therapy gym and demonstrated zoom ball activity.  Pt handed zoom ball and initiated use and continued to engage with additional therapist without verbal cues.  Pt returned to nursing station and remained in w/c with QRB in place.    [name], [name] 08/31/2016, 2:35 PM

## 2016-08-31 NOTE — Progress Notes (Signed)
Physical Therapy Weekly Progress Note  Patient Details  Name: [name] MRN: [id number] Date of Birth: February 12, 1981  Beginning of progress report period: August 25, 2016 End of progress report period: August 31, 2016   Patient has met 1 of 2 short term goals.  Pt has improved transfers, requiring only min/mod A with increased time.  Pt continues to fluctuate with following verbal and visual commands for therapeutic tasks.  Patient continues to demonstrate the following deficits muscle weakness, decreased coordination and decreased motor planning, decreased initiation, decreased attention, decreased awareness, decreased problem solving, decreased safety awareness, decreased memory and delayed processing and decreased standing balance, decreased postural control and decreased balance strategies and therefore will continue to benefit from skilled PT intervention to increase functional independence with mobility.  Patient progressing toward long term goals..  Continue plan of care.  PT Short Term Goals Week 2:  PT Short Term Goal 1 (Week 2): Pt will consistently perform transfers with +1 assist PT Short Term Goal 1 - Progress (Week 2): Met PT Short Term Goal 2 (Week 2): Pt will follow 1 step commands 50% of the time PT Short Term Goal 2 - Progress (Week 2): Progressing toward goal Week 3:  PT Short Term Goal 1 (Week 3): Pt will attend to functional task x 5 minutes with min cuing PT Short Term Goal 2 (Week 3): Pt will gait with min A 150' in controlled environment  Skilled Therapeutic Interventions/Progress Updates:  Ambulation/gait training;Cognitive remediation/compensation;Discharge planning;DME/adaptive equipment instruction;Functional mobility training;Pain management;Splinting/orthotics;Therapeutic Activities;UE/LE Strength taining/ROM;UE/LE Coordination activities;Therapeutic Exercise;Stair training;Patient/family education;Neuromuscular re-education;Functional electrical  stimulation;Community reintegration;Balance/vestibular training    See Function Navigator for Current Functional Status.  Jaklyn Alen 08/31/2016, 8:28 AM

## 2016-08-31 NOTE — Progress Notes (Signed)
Occupational Therapy Session Note  Patient Details  Name: [name] MRN: [id number] Date of Birth: Jul 06, 1980  Today's Date: 08/31/2016 OT Individual Time: 0700-0800 OT Individual Time Calculation (min): 60 min    Short Term Goals: Week 2:  OT Short Term Goal 1 (Week 2): Pt will complete 2 of 5 grooming tasks with setup, min vc and hand guidance to initiate task. OT Short Term Goal 2 (Week 2): Pt will participate in select activity, sustaining focused attention for 5 minutes during functional task. OT Short Term Goal 3 (Week 2): Pt will thread BLE into pants with mn verbal cues OT Short Term Goal 4 (Week 2): Pt will initiate bathing tasks with min verbal cues  OT Short Term Goal 5 (Week 2): Pt will perform toilet transfers with mod A +1  Skilled Therapeutic Interventions/Progress Updates:    Pt engaged in BADL retraining including toileting, bathing at shower level, and dressing with sit<>stand from w/c at sink.  Pt performed bed mobility with min tactile cue to initiate sitting EOB.  Pt amb to bathroom with min A and removed clothing before sitting on toilet.  Pt transferred to shower for bathing tasks.  Pt continues to require max multimodal cues to initiate bathing tasks and terminate tasks.  Pt returned to w/c for dressing tasks.  Pt initiated and donned socks when presented with socks.  Pt crossed legs to complete task.  Pt requires more than a reasonable amount of time to initiate task.  Pt initiated threading BLE into pants but required assistance to complete task.  Pt remained in w/c with QRB in place at nursing station. Focus on task initiation, task termination, attention to task, standing balance, sit<>stand, and functional amb without AD to increase independence with BADLs.  Therapy Documentation Precautions:  Precautions Precautions: Fall, Cervical Precaution Comments: trach and peg, trach collar vs vent  Restrictions Weight Bearing Restrictions: No Pain:  Pt with no s/s  of pain  See Function Navigator for Current Functional Status.   Therapy/Group: Individual Therapy  Rich BraveLanier, Nalah Macioce Chappell 08/31/2016, 9:07 AM
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
## 2016-08-31 NOTE — Progress Notes (Signed)
Physical Therapy Note  Patient Details  Name: [name] MRN: [id number] Date of Birth: October 24, 1980 Today's Date: 08/31/2016    Time: 1300-1355 55 minutes  1:1 No signs/symptoms of pain during session.   Pt able to perform sit to stand transfers throughout session from a variety of surfaces with min/mod A and increased time and cuing.  Gait throughout unit with min A in quiet environment, mod A to correct LOB when distracted.  Attempt bowling game with pt perseverating on putting fingers in ball, unable to roll ball.  Using a ball without holes pt able to roll ball with mod tactile and verbal cues.  Pt with increased pulling on pants and pulling pants up/down.  Attempted toileting with mod A for toilet transfer, pt did not have bowel or bladder movement during the attempt.  Nu step for attention to task with pt requiring cues every 1-2 minutes to continue pedaling with LEs, pt eventually uses UEs only for nustep.  Pt initiated sitting on bed and PT asked pt if he wanted to rest, pt continues to fidget with pants and eventually stands up and continues with session.  Pt limited by decreased attention this session.   Chais Fehringer 08/31/2016, 1:53 PM

## 2016-09-01 ENCOUNTER — Inpatient Hospital Stay (HOSPITAL_COMMUNITY): Payer: Self-pay | Admitting: Occupational Therapy

## 2016-09-01 ENCOUNTER — Inpatient Hospital Stay (HOSPITAL_COMMUNITY): Payer: Medicaid Other | Admitting: Speech Pathology

## 2016-09-01 ENCOUNTER — Inpatient Hospital Stay (HOSPITAL_COMMUNITY): Payer: Self-pay | Admitting: Physical Therapy

## 2016-09-01 DIAGNOSIS — I159 Secondary hypertension, unspecified: Secondary | ICD-10-CM

## 2016-09-01 LAB — GLUCOSE, CAPILLARY
GLUCOSE-CAPILLARY: 72 mg/dL (ref 65–99)
GLUCOSE-CAPILLARY: 111 mg/dL — AB (ref 65–99)
GLUCOSE-CAPILLARY: 111 mg/dL — AB (ref 65–99)
GLUCOSE-CAPILLARY: 80 mg/dL (ref 65–99)
Glucose-Capillary: 69 mg/dL (ref 65–99)
Glucose-Capillary: 205 mg/dL — ABNORMAL HIGH (ref 65–99)
Glucose-Capillary: 101 mg/dL — ABNORMAL HIGH (ref 65–99)

## 2016-09-01 MED ORDER — TRAZODONE HCL 50 MG PO TABS
50.0000 mg | ORAL_TABLET | Freq: Every day | ORAL | Status: DC
  Administered 2016-09-01: 50 mg via ORAL
  Filled 2016-09-01: qty 1

## 2016-09-01 NOTE — Progress Notes (Signed)
Occupational Therapy Session Note  Patient Details  Name: [name] MRN: [id number] Date of Birth: February 16, 1981  Today's Date: 09/01/2016 OT Individual Time: 1450-1535 OT Individual Time Calculation (min): 45 min     Skilled Therapeutic Interventions/Progress Updates: Pt was sitting in w/c with fiance present at time of arrival. Tx focus on initiation and sustained attention during functional activities. Initially had pt flip playing cards in dayroom with pt disengaged. Transitioned to meaningful occupation-based task in gym involving dribbling basketball, shooting hoops, and tossing ball to therapist. Pt able to maintain attention x7 minutes during ball toss while in a moderately stimulating environment. Pt unable to react to changes in activity (i.e. Therapist changing location to L<R). Pt initiated shooting baskets (x3 times) and ball dribbling x4 times during tx, able to participate in dribbling task x2 minutes before terminating. He was then brought back to room and left with fiance at time of departure.      Therapy Documentation Precautions:  Precautions Precautions: Fall, Cervical Precaution Comments: trach and peg, trach collar vs vent  Restrictions Weight Bearing Restrictions: No General:   Vital Signs: Therapy Vitals Pulse Rate: 68 Resp: 18 BP: (!) 141/85 Patient Position (if appropriate): Sitting Oxygen Therapy SpO2: 100 % O2 Device: Not Delivered Pain: No expressions of pain during session    ADL: ADL ADL Comments: see Functional Assessment Tool    See Function Navigator for Current Functional Status.   Therapy/Group: Individual Therapy  Tam Delisle A Usbaldo Pannone 09/01/2016, 4:26 PM
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
## 2016-09-01 NOTE — Progress Notes (Signed)
Occupational Therapy Session Note  Patient Details  Name: [name] MRN: [id number] Date of Birth: Feb 11, 1981  Today's Date: 09/01/2016 OT Individual Time: 0930-1030 OT Individual Time Calculation (min): 60 min    Short Term Goals: Week 2:  OT Short Term Goal 1 (Week 2): Pt will complete 2 of 5 grooming tasks with setup, min vc and hand guidance to initiate task. OT Short Term Goal 2 (Week 2): Pt will participate in select activity, sustaining focused attention for 5 minutes during functional task. OT Short Term Goal 3 (Week 2): Pt will thread BLE into pants with mn verbal cues OT Short Term Goal 4 (Week 2): Pt will initiate bathing tasks with min verbal cues  OT Short Term Goal 5 (Week 2): Pt will perform toilet transfers with mod A +1  Skilled Therapeutic Interventions/Progress Updates: self care retraining at shower level with focus on making choices between two things, initiation and follow through with familiar basic tasks. Pt brushed teeth at sink once tooth paste put on brush wihtout cues and able to terminate task without cuing. Pt did need extra time and demonstrative cues to spit into the sink. Pt wash face with intiatal instructional cues at the sink. Transitioned into shower, ambulating from bathroom door, with min A and extra time to come into sitting on bench in shower. Pt bathed with mod cuing for bathing each part of body (with tactile cue.) Pt dried off in standing and ambulated with min A into room into regular blue chair. Pt able to orient clothing and don with just presentation of clothing as an environmental cue. Pt did don shoes on incorrect feet initially without awareness they were on the wrong feet. Pt initiated sit to stands with min A for follow through for clothing management. Pt chose to transition to the w/c with standing and ambulating to chair when asked if he wanted to lay down or sit to w/c. Left in w/c with fiance in the room.      Therapy  Documentation Precautions:  Precautions Precautions: Fall, Cervical Precaution Comments: trach and peg, trach collar vs vent  Restrictions Weight Bearing Restrictions: No Pain:  no c/o pain  See Function Navigator for Current Functional Status.   Therapy/Group: Individual Therapy  Roney MansSmith, Hollyn Stucky [facility] 09/01/2016, 2:17 PM

## 2016-09-01 NOTE — Progress Notes (Signed)
Physical Therapy Note  Patient Details  Name: Elmon ElseDarren T Haworth MRN: [id number] Date of Birth: 1980/12/18 Today's Date: 09/01/2016    Time: 1300-1325 25 minutes  1:1 No signs/symptoms of pain.  Pt able to perform sit to stand consistently from w/c, mat and sofa all with min A and decreasing tactile and gesturing cues.  Pt performed gait throughout unit, able to navigate obstacles with min tactile cuing, min A for balance during gait.  Attempted to have pt assemble pipe tree, pt unable to assemble but participated in disassembling pipe tree x 3 attempts.  Pt continues with perseverating on pulling pants legs up/down and using fidget spinner.   DONAWERTH,KAREN 09/01/2016, 1:56 PM

## 2016-09-01 NOTE — Progress Notes (Signed)
Social Work Patient ID: Madlyn Frankel, male   DOB: [birth year], 36 y.o.   MRN: [id number]   Met with pt's mother on Wednesday to review team conference.  She is aware that team not yet ready to target d/c date but have estimated 3 weeks further.  She admits she is anxious to have him able to communicate as are we.  She is concerned about "... How serious he looks all the time now..." and questions his emotional status.  Explained that we will monitor mood and involve neuropsychology as appropriate.  Silas Muff, LCSW

## 2016-09-01 NOTE — Progress Notes (Signed)
Speech Language Pathology Weekly Progress and Session Note  Patient Details  Name: [name] MRN: [id number] Date of Birth: 1980/07/11  Beginning of progress report period: August 25, 2016 End of progress report period: September 01, 2016  Today's Date: 09/01/2016 SLP Individual Time: 0815-0900 SLP Individual Time Calculation (min): 45 min  Short Term Goals: Week 2: SLP Short Term Goal 1 (Week 2): Patient will follow 1 step commands in 75% of opportunities with Max A multimodal cues.  SLP Short Term Goal 1 - Progress (Week 2): Met SLP Short Term Goal 2 (Week 2): Patient will verbalize at the word level in 25% of opportunities with Max A multimodal cues.  SLP Short Term Goal 2 - Progress (Week 2): Not met SLP Short Term Goal 3 (Week 2): Patient will demonstrate focused attention to task for ~60 seconds with Max A multimodal cues.  SLP Short Term Goal 3 - Progress (Week 2): Met SLP Short Term Goal 4 (Week 2): Patient will consume trials of ice chips with Min assist multimodal cues for initiation of swallow sequence with overt s/s of aspiraiton in less then 25% of trials to demonstrate readiness for an objective swallow assessment  SLP Short Term Goal 4 - Progress (Week 2): Met SLP Short Term Goal 5 (Week 2): Patient will initiate functional tasks with Max A verbal cues in 50% of opportunities  SLP Short Term Goal 5 - Progress (Week 2): Met    New Short Term Goals: Week 3: SLP Short Term Goal 1 (Week 3): Patient will verbalize at the word level in 25% of opportunities with Max A multimodal cues.  SLP Short Term Goal 2 (Week 3): Patient will demonstrate sustained attention to task for ~2 minutes with Max A multimodal cues.  SLP Short Term Goal 3 (Week 3): Patient will initiate functional tasks with Max A verbal cues in 75% of opportunities  SLP Short Term Goal 4 (Week 3): Patient will consume trials of ice chips with Min assist multimodal cues for initiation of swallow sequence with overt  s/s of aspiraiton in less then 25% of trials to demonstrate readiness for an objective swallow assessment   Weekly Progress Updates: Patient has made excellent gains and has met 4 of 5 STG's this reporting period. Just recently within the last few days, patient has appeared more alert and aware of environment. Although he continues to require overall Max-Total A to complete tasks, patient demonstrates increased initiation and attention to tasks, especially in regards to self-feeding and swallow initiation. Therefore, plan for MBS early next week to assess for possible initiation of PO diet. Patient continues to be nonverbal but is moving his oral musculature more with voicing heard while burping, throat clearing and coughing. Patient would benefit from continued skilled SLP intervention to maximize his cognitive and swallowing function as well as his functional communication in order to improve his overall functional independence prior to discharge.    Intensity: Minumum of 1-2 x/day, 30 to 90 minutes Frequency: 3 to 5 out of 7 days Duration/Length of Stay: 3 weeks  Treatment/Interventions: English as a second language teacher;Dysphagia/aspiration precaution training;Environmental controls;Functional tasks;Patient/family education;Cognitive remediation/compensation;Internal/external aids;Speech/Language facilitation;Therapeutic Activities   Daily Session  Skilled Therapeutic Interventions: Skilled treatment session focused on cognitive and dysphagia goals. Upon arrival, patient was awake while supine in bed. SLP facilitated session by providing Max A verbal and tactile cues along with extra time for patient to initiate sitting EOB and transfer to wheelchair. Patient performed oral care via the suction toothbrush with Min verbal  cues for initiation and Max verbal and tactile cues for cessation of task.  Patient self-fed a cup of ice chips without overt s/s of aspiration but demonstrated a suspected delayed swallow  initiation. However, patient only required intermittent supervision verbal cues for attention to task for ~10 minutes! Patient also consumed 2 small, controlled sips of thin liquids via cup without overt s/s of aspiration. Suspect patient will be ready for MBS early next week to assess readiness for PO intake due to recent and quick gains within the last 2 days. SLP also facilitated session by providing Max a tactile and verbal cues for initiation of standing and transfer to commode. Patient was continent of bowel and bladder. Patient left upright in wheelchair with RN present. Continue with current plan of care.      Function:   Eating Eating   Modified Consistency Diet: Yes (with trials from SLP ) Eating Assist Level: Supervision or verbal cues           Cognition Comprehension Comprehension assist level: Understands basic 25 - 49% of the time/ requires cueing 50 - 75% of the time  Expression   Expression assist level: Expresses basis less than 25% of the time/requires cueing >75% of the time.  Social Interaction Social Interaction assist level: Interacts appropriately less than 25% of the time. May be withdrawn or combative.  Problem Solving Problem solving assist level: Solves basic less than 25% of the time - needs direction nearly all the time or does not effectively solve problems and may need a restraint for safety  Memory Memory assist level: Recognizes or recalls less than 25% of the time/requires cueing greater than 75% of the time   Pain No indications of pain   Therapy/Group: Individual Therapy  Marlene Pfluger 09/01/2016, 2:49 PM

## 2016-09-01 NOTE — Progress Notes (Signed)
Physical Therapy Session Note  Patient Details  Name: [name] MRN: [id number] Date of Birth: 07/29/1980  Today's Date: 09/01/2016 PT Individual Time: 1105-1200 PT Individual Time Calculation (min): 55 min   Short Term Goals: Week 3:  PT Short Term Goal 1 (Week 3): Pt will attend to functional task x 5 minutes with min cuing PT Short Term Goal 2 (Week 3): Pt will gait with min A 150' in controlled environment  Skilled Therapeutic Interventions/Progress Updates:   Pt received sitting in WC and agreeable to PT  Gait throughout rehab unit performed 2x 200ft, 175ft, 90ft, and 100ft. PT provided min assist throught for safety as well as preventing patient from pulling pants down while walking. Sustained attention task on Nustep x 5 minutes  With min cues for improved use of BLE and BUE. PT attempted to instruct pt in locating color  tasks in gym, but pt demonstrated no evidence awareness to target colors indicated by patient. Cleaning task in day room to stack puzzle pieces and place in bag x 8 minutes with intermittent attention to task. Basketball toss and drop into goal x 4 minutes with delayed initiation of task and cessation. Dynavision board program A x 3. Patient initiated to touch target with max cues in all quadrants only 6 times for 6 minutes. Patient returned too room and left sitting in WC with QRB, call bell in reach and all needs met.        Therapy Documentation Precautions:  Precautions Precautions: Fall, Cervical Precaution Comments: trach and peg, trach collar vs vent  Restrictions Weight Bearing Restrictions: No Pain: no indication   See Function Navigator for Current Functional Status.   Therapy/Group: Individual Therapy   E  09/01/2016, 12:27 PM  
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
## 2016-09-02 ENCOUNTER — Inpatient Hospital Stay (HOSPITAL_COMMUNITY): Payer: Medicaid Other | Admitting: Occupational Therapy

## 2016-09-02 ENCOUNTER — Inpatient Hospital Stay (HOSPITAL_COMMUNITY): Payer: Self-pay | Admitting: Speech Pathology

## 2016-09-02 LAB — GLUCOSE, CAPILLARY
GLUCOSE-CAPILLARY: 90 mg/dL (ref 65–99)
GLUCOSE-CAPILLARY: 93 mg/dL (ref 65–99)
GLUCOSE-CAPILLARY: 100 mg/dL — AB (ref 65–99)
Glucose-Capillary: 114 mg/dL — ABNORMAL HIGH (ref 65–99)
Glucose-Capillary: 209 mg/dL — ABNORMAL HIGH (ref 65–99)
Glucose-Capillary: 60 mg/dL — ABNORMAL LOW (ref 65–99)
Glucose-Capillary: 68 mg/dL (ref 65–99)
Glucose-Capillary: 76 mg/dL (ref 65–99)

## 2016-09-02 MED ORDER — TRAZODONE HCL 50 MG PO TABS
100.0000 mg | ORAL_TABLET | Freq: Every day | ORAL | Status: DC
  Administered 2016-09-02 – 2016-09-18 (×17): 100 mg via ORAL
  Filled 2016-09-02 (×19): qty 2

## 2016-09-02 NOTE — Progress Notes (Signed)
Occupational Therapy Session Note  Patient Details  Name: [name] MRN: [id number] Date of Birth: [birth year]  Today's Date: 09/02/2016 OT Individual Time: 1415-1456 OT Individual Time Calculation (min): 41 min    Short Term Goals: Week 2:  OT Short Term Goal 1 (Week 2): Pt will complete 2 of 5 grooming tasks with setup, min vc and hand guidance to initiate task. OT Short Term Goal 2 (Week 2): Pt will participate in select activity, sustaining focused attention for 5 minutes during functional task. OT Short Term Goal 3 (Week 2): Pt will thread BLE into pants with mn verbal cues OT Short Term Goal 4 (Week 2): Pt will initiate bathing tasks with min verbal cues  OT Short Term Goal 5 (Week 2): Pt will perform toilet transfers with mod A +1  Skilled Therapeutic Interventions/Progress Updates:    Upon entering the room, pt supine in bed resting. Pt given multimodal cues to sit on EOB. OT waiting 10 minutes for pt to initiate but he did not. Pt very resistive to movement this session. OT provided pt with pants and he donned while supine in bed. Pt crossing ankles to thread onto feet and bridging to pull over B hips. Pt sitting on EOB and handed shoes in which he donned without any further cues to do so. Pt provided tactile cues for pt to stand but he became resistive and leaned back toward bed. Pt began rubbing L knee and then placed self back into bed. Fiance arrived in room who report he had L knee pain prior to admission. Muscle rub applied and RN notified. Pt remained supine in bed at end of session with bed alarm activated.   Therapy Documentation Precautions:  Precautions Precautions: Fall, Cervical Precaution Comments: trach and peg, trach collar vs vent  Restrictions Weight Bearing Restrictions: No General:   Vital Signs: Therapy Vitals Temp: 99 F (37.2 C) Temp Source: Oral Pulse Rate: 66 Resp: 18 BP: 140/80 Patient Position (if appropriate): Lying Oxygen Therapy SpO2:  100 % O2 Device: Not Delivered Pain: Pain Assessment Pain Score: 6  ADL: ADL ADL Comments: see Functional Assessment Tool Exercises:   Other Treatments:    See Function Navigator for Current Functional Status.   Therapy/Group: Individual Therapy  [name], [name] P 09/02/2016, 4:59 PM

## 2016-09-02 NOTE — Progress Notes (Signed)
Speech Language Pathology Daily Session Note  Patient Details  Name: [name] MRN: [id number] Date of Birth: 1981/05/26  Today's Date: 09/02/2016 SLP Individual Time: 1130-1200 SLP Individual Time Calculation (min): 30 min  Short Term Goals: Week 3: SLP Short Term Goal 1 (Week 3): Patient will verbalize at the word level in 25% of opportunities with Max A multimodal cues.  SLP Short Term Goal 2 (Week 3): Patient will demonstrate sustained attention to task for ~2 minutes with Max A multimodal cues.  SLP Short Term Goal 3 (Week 3): Patient will initiate functional tasks with Max A verbal cues in 75% of opportunities  SLP Short Term Goal 4 (Week 3): Patient will consume trials of ice chips with Min assist multimodal cues for initiation of swallow sequence with overt s/s of aspiraiton in less then 25% of trials to demonstrate readiness for an objective swallow assessment   Skilled Therapeutic Interventions: Skilled treatment session focused on addressing cognition and dysphagia goals. SLP facilitated session by providing Total assist for oral care via toothette with Max verbal and tactile cues for initiation of mouth opening.  After set-up patient self-fed ice chips with multiple swallows, but no overt s/s of aspiration.  Patient required Min assist verbal cues for attention to task for ~2 minutes.  Patient also consumed sips of thin liquids via cup with Max assist verbal and tactile cues to initiate sips.  Patient without overt s/s of aspiration but suspect an intermittent delay in swallow due to attention impairments.  Continue with current plan of care and goal for an objective assessment next week.           Function:  Eating Eating   Modified Consistency Diet: Yes (trials with slp) Eating Assist Level: Supervision or verbal cues;Help managing cup/glass;Set up assist for   Eating Set Up Assist For: Opening containers       Cognition Comprehension Comprehension assist level:  Understands basic 25 - 49% of the time/ requires cueing 50 - 75% of the time  Expression   Expression assist level: Expresses basis less than 25% of the time/requires cueing >75% of the time.  Social Interaction Social Interaction assist level: Interacts appropriately less than 25% of the time. May be withdrawn or combative.  Problem Solving Problem solving assist level: Solves basic less than 25% of the time - needs direction nearly all the time or does not effectively solve problems and may need a restraint for safety  Memory Memory assist level: Recognizes or recalls less than 25% of the time/requires cueing greater than 75% of the time    Pain Pain Assessment Pain Assessment: No/denies pain  Therapy/Group: Individual Therapy  Justin FerrettiMelissa Vincient Page, M.A., CCC-SLP [phone]  [name] 09/02/2016, 12:23 PM
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
## 2016-09-03 LAB — GLUCOSE, CAPILLARY
GLUCOSE-CAPILLARY: 84 mg/dL (ref 65–99)
GLUCOSE-CAPILLARY: 103 mg/dL — AB (ref 65–99)
GLUCOSE-CAPILLARY: 102 mg/dL — AB (ref 65–99)
GLUCOSE-CAPILLARY: 131 mg/dL — AB (ref 65–99)
GLUCOSE-CAPILLARY: 69 mg/dL (ref 65–99)
Glucose-Capillary: 120 mg/dL — ABNORMAL HIGH (ref 65–99)
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
## 2016-09-04 ENCOUNTER — Inpatient Hospital Stay (HOSPITAL_COMMUNITY): Payer: Self-pay

## 2016-09-04 ENCOUNTER — Inpatient Hospital Stay (HOSPITAL_COMMUNITY): Payer: Self-pay | Admitting: Physical Therapy

## 2016-09-04 ENCOUNTER — Inpatient Hospital Stay (HOSPITAL_COMMUNITY): Payer: Medicaid Other | Admitting: Speech Pathology

## 2016-09-04 ENCOUNTER — Inpatient Hospital Stay (HOSPITAL_COMMUNITY): Payer: Medicaid Other | Admitting: Physical Therapy

## 2016-09-04 LAB — CBC WITH DIFFERENTIAL/PLATELET
BASOS PCT: 1 %
Basophils Absolute: 0 10*3/uL (ref 0.0–0.1)
EOS ABS: 0.2 10*3/uL (ref 0.0–0.7)
Eosinophils Relative: 3 %
HEMATOCRIT: 39.3 % (ref 39.0–52.0)
HEMOGLOBIN: 12.5 g/dL — AB (ref 13.0–17.0)
LYMPHS ABS: 2.1 10*3/uL (ref 0.7–4.0)
Lymphocytes Relative: 36 %
MCH: 30.2 pg (ref 26.0–34.0)
MCHC: 31.8 g/dL (ref 30.0–36.0)
MCV: 94.9 fL (ref 78.0–100.0)
MONO ABS: 0.6 10*3/uL (ref 0.1–1.0)
MONOS PCT: 11 %
NEUTROS ABS: 2.9 10*3/uL (ref 1.7–7.7)
Neutrophils Relative %: 49 %
Platelets: 260 10*3/uL (ref 150–400)
RBC: 4.14 MIL/uL — ABNORMAL LOW (ref 4.22–5.81)
RDW: 15.2 % (ref 11.5–15.5)
WBC: 5.7 10*3/uL (ref 4.0–10.5)

## 2016-09-04 LAB — BASIC METABOLIC PANEL
ANION GAP: 5 (ref 5–15)
BUN: 8 mg/dL (ref 6–20)
CALCIUM: 9.5 mg/dL (ref 8.9–10.3)
CO2: 31 mmol/L (ref 22–32)
Chloride: 101 mmol/L (ref 101–111)
Creatinine, Ser: 0.74 mg/dL (ref 0.61–1.24)
GFR calc Af Amer: >60 mL/min (ref >60)
GLUCOSE: 100 mg/dL — AB (ref 65–99)
POTASSIUM: 3.9 mmol/L (ref 3.5–5.1)
SODIUM: 137 mmol/L (ref 135–145)

## 2016-09-04 LAB — GLUCOSE, CAPILLARY
GLUCOSE-CAPILLARY: 109 mg/dL — AB (ref 65–99)
GLUCOSE-CAPILLARY: 109 mg/dL — AB (ref 65–99)
GLUCOSE-CAPILLARY: 115 mg/dL — AB (ref 65–99)
GLUCOSE-CAPILLARY: 177 mg/dL — AB (ref 65–99)
Glucose-Capillary: 54 mg/dL — ABNORMAL LOW (ref 65–99)
Glucose-Capillary: 127 mg/dL — ABNORMAL HIGH (ref 65–99)
Glucose-Capillary: 93 mg/dL (ref 65–99)
Glucose-Capillary: 89 mg/dL (ref 65–99)

## 2016-09-04 MED ORDER — METHYLPHENIDATE HCL 5 MG PO TABS
15.0000 mg | ORAL_TABLET | Freq: Two times a day (BID) | ORAL | Status: DC
  Administered 2016-09-05 – 2016-09-11 (×13): 15 mg
  Filled 2016-09-04 (×13): qty 3

## 2016-09-04 NOTE — Progress Notes (Signed)
Physical Therapy Note  Patient Details  Name: Elmon ElseDarren T Suit MRN: [id number] Date of Birth: [birth year] Today's Date: 09/04/2016    Time: 830-925 55 minutes  1:1 No signs/symptoms of pain.  Gait throughout unit with min A fading to supervision. Pt needs min A to guide due to Rt visual deficits.  Weight training with para-gym with lat pull down and rows with pt able to perform 2 x 10 reps with min/mod cuing.  Basketball game with pt "dunking" balls and able to pass and dribble without cuing.  Pt continuing to perseverate on pulling pants up and down throughout session. Pt performed toilet transfer with min A, continent of bladder.  Pt requires max A to don pants and underwear.  Attempted to have pt wear boxers vs. Brief to decrease perseveration on pants up/down, pt continues to perseverate on pants.   Kresta Templeman 09/04/2016, 9:24 AM

## 2016-09-04 NOTE — Progress Notes (Signed)
Physical Therapy Session Note  Patient Details  Name: [name] MRN: [id number] Date of Birth: 1981/02/18  Today's Date: 09/04/2016 PT Individual Time: [id number] PT Individual Time Calculation (min): 69 min   Short Term Goals: Week 3:  PT Short Term Goal 1 (Week 3): Pt will attend to functional task x 5 minutes with min cuing PT Short Term Goal 2 (Week 3): Pt will gait with min A 150' in controlled environment  Skilled Therapeutic Interventions/Progress Updates:  Pt received in w/c. Session focused on cognitive remediation (initiation, attention, awareness), functional mobility, endurance training, and R inattention. Pt ambulated throughout unit without AD & steady assist; pt with R inattention & requires cuing for obstacle avoidance as well as max assist for path finding. Pt completed car transfer at low, sedan simulated height with supervision, and sit<>stand transfers with steady assist/supervision, only requiring occasional cuing to initiate transfer. Pt engaged in tossing & shooting basketball and was able to initiate task without cuing today. Attempted to have pt match cards on velcro board & pt only able to correctly match 1/5. Engaged pt in use of dynavision but pt with short attention to task (10-20 seconds) and requires max cuing to locate light and to press it 2/2 visuospatial impairments. Attempted to have pt write his name on board with PT demonstrating first but pt wrote "Danelle" multiple times instead. Pt also unable to copy shapes on dry-erase board. Pt engaged in connect four with good ability to place coins in slot. Attempted to engage pt in conversation but pt continues to remain non-verbal, and will only occasionally respond by nodding/shaking head yes or no. Throughout session pt required max cuing to keep hands out of pants as pt very distracted by brief. At end of session pt left sitting in w/c with QRB donned & at nurses station.   Therapy Documentation Precautions:   Precautions Precautions: Fall, Cervical Precaution Comments: trach and peg, trach collar vs vent  Restrictions Weight Bearing Restrictions: No  Pain: Faces - no hurt.   See Function Navigator for Current Functional Status.   Therapy/Group: Individual Therapy  Sandi MariscalVictoria M Elizette Shek 09/04/2016, 5:25 PM

## 2016-09-04 NOTE — Progress Notes (Signed)
Speech Language Pathology Daily Session Note  Patient Details  Name: [name] MRN: [id number] Date of Birth: [birth year]  Today's Date: 09/04/2016 SLP Individual Time: [id number] SLP Individual Time Calculation (min): 45 min  Short Term Goals: Week 3: SLP Short Term Goal 1 (Week 3): Patient will verbalize at the word level in 25% of opportunities with Max A multimodal cues.  SLP Short Term Goal 2 (Week 3): Patient will demonstrate sustained attention to task for ~2 minutes with Max A multimodal cues.  SLP Short Term Goal 3 (Week 3): Patient will initiate functional tasks with Max A verbal cues in 75% of opportunities  SLP Short Term Goal 4 (Week 3): Patient will consume trials of ice chips with Min assist multimodal cues for initiation of swallow sequence with overt s/s of aspiraiton in less then 25% of trials to demonstrate readiness for an objective swallow assessment   Skilled Therapeutic Interventions: Skilled treatment session focused on dysphagia and cognitive goals. SLP facilitated session by providing Mod A verbal and visual cues for initiation and problem solving during oral care at the sink. Patient self-fed a cup of ice chips with Min A verbal cues for initiation and selective attention in a mildly distracting environment for ~10 minutes. Patient demonstrated what appeared to be a delayed swallow initiation without overt s/s of aspiration. Recommend MBS tomorrow to assess swallowing function. Patient incontinent of bowel while wearing boxers, therefore, patient placed in shower and required total A for self-care and Mod A verbal cues for problem solving with dressing. Patient handed off to OT. Continue with current plan of care.      Function:  Eating Eating   Modified Consistency Diet: Yes (with trials from SLP) Eating Assist Level: Supervision or verbal cues           Cognition Comprehension Comprehension assist level: Understands basic 25 - 49% of the time/ requires  cueing 50 - 75% of the time  Expression   Expression assist level: Expresses basis less than 25% of the time/requires cueing >75% of the time.  Social Interaction Social Interaction assist level: Interacts appropriately less than 25% of the time. May be withdrawn or combative.  Problem Solving Problem solving assist level: Solves basic less than 25% of the time - needs direction nearly all the time or does not effectively solve problems and may need a restraint for safety  Memory Memory assist level: Recognizes or recalls less than 25% of the time/requires cueing greater than 75% of the time    Pain No/Denies Pain   Therapy/Group: Individual Therapy  [name] 09/04/2016, 3:07 PM

## 2016-09-04 NOTE — Progress Notes (Signed)
Occupational Therapy Note  Patient Details  Name: Elmon ElseDarren T Dirden MRN: [id number] Date of Birth: 1981-03-16  Today's Date: 09/04/2016 OT Individual Time: 1115-1200 OT Individual Time Calculation (min): 45 min   Pt with no s/s of pain Individual therapy  Pt resting in w/c upon arrival with SLP present.  Attempted to engage patient in sorting cards by color.  Pt would place card on correct pile approx 25% of time.  Pt required more than a reasonable amount of time to initiate task with max multimodal cues.  Pt transitioned to placing tokens in Connect 4 board.  Pt required demonstrational cue to initiate task and max verbal cues to continue with task.  Pt exhibited difficulty placing tokens in slots on right of board 2/2 R visual field deficitis. Pt amb without AD to therapy gym.  Attempted to engage pt in boxing activity with punching bag.  Pt removed gloves before attempting to engage in task.  Pt amb back to room and remained in w/c with QRB in place and fiancee present.  Focus on task initiation, following one step commands, attention to task, sequencing, functional amb, and safety awareness to increase independence with BADLs.   Lavone NeriLanier, Isobelle Tuckett [facility] 09/04/2016, 12:10 PM

## 2016-09-04 NOTE — Progress Notes (Signed)
Occupational Therapy Session Note  Patient Details  Name: [name] MRN: [id number] Date of Birth: 1980/07/08  Today's Date: 09/04/2016 OT Individual Time: 0700-0758 OT Individual Time Calculation (min): 58 min    Short Term Goals: Week 3:  OT Short Term Goal 1 (Week 3): Pt will perform toilet transfers with supervision OT Short Term Goal 2 (Week 3): Pt will complete bathing tasks with supervision and min verbal cues for initiattion OT Short Term Goal 3 (Week 3): Pt will complete LB dressing tasks with supervision and min verbal cues for initiation OT Short Term Goal 4 (Week 3): Pt will perform grooming tasks standing at sink with supervision and min verbal cues for initiation  Skilled Therapeutic Interventions/Progress Updates:    Pt engaged in BADL retraining including bathing at shower level and dressing with sit<>stand from w/c at sink. Pt amb with steady A to bathroom to use toilet prior to transfer to shower.  Pt completed toileting tasks and bathing tasks without physical assistance but continues to required min/mod verbal cues and extra time for initiation. Pt requires min verbal cues to transition between bathing tasks as pt perseverates on bathing tasks.  Pt initiated and completed dressing tasks when presented with clothing items.  Pt requires steady A when standing for LB bathing and dressing tasks. Pt amb to sink for grooming tasks.  Pt required mod verbal cues for initiation of tasks and transition between tasks.  Pt continues to exhibit difficulty with locating objects to his right. Focus on activity tolerance, functional amb without AD, standing balance, task initiation, sequencing, and safety awareness to increase independence with BADLs.   Therapy Documentation Precautions:  Precautions Precautions: Fall, Cervical Precaution Comments: trach and peg, trach collar vs vent  Restrictions Weight Bearing Restrictions: No Pain:  Pt with no s/s of pain  See Function  Navigator for Current Functional Status.   Therapy/Group: Individual Therapy  Rich BraveLanier, [name] 09/04/2016, 8:07 AM
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
## 2016-09-04 NOTE — Progress Notes (Signed)
Hypoglycemic Event  CBG: 54  Treatment: 15 GM carbohydrate snack  Symptoms: None  Follow-up CBG: Time: 0450 CBG Result:109  Possible Reasons for Event: Unknown  Comments/MD notified: Dr. Resa MinerPatel    [name]

## 2016-09-04 NOTE — Progress Notes (Signed)
Occupational Therapy Weekly Progress Note  Patient Details  Name: [name] MRN: [id number] Date of Birth: 01-27-1981  Beginning of progress report period: August 27, 2016 End of progress report period: September 04, 2016  Patient has met 5 of 5 short term goals.  Pt has made steady progress with BADLs during the past week.  Pt currently amb with HHA/close supervision for functional amb.  Pt performs sit<>stand with min A/supervision and extra time to initiate.  Pt continues to require mod verbal cues to initiate bathing tasks but initiates dressing tasks when presented with clothing.  Pt follows one step commands with min/mod verbal cues for basic functional tasks (stand up, sit down, walk, turn R/L, etc.). Pt is nonverbal and nods head inconsistently with 25% (?) accuracy.  Pt exhibits behaviors consistent with Rancho Level V-emerging VI.  Patient continues to demonstrate the following deficits: muscle weakness, decreased cardiorespiratoy endurance, impaired timing and sequencing and decreased coordination, decreased visual acuity and decreased visual motor skills, decreased midline orientation and decreased attention to left, decreased initiation, decreased attention, decreased awareness, decreased problem solving, decreased safety awareness and delayed processing and decreased standing balance, decreased balance strategies and difficulty maintaining precautions and therefore will continue to benefit from skilled OT intervention to enhance overall performance with BADL and Reduce care partner burden.  Patient progressing toward long term goals..  Continue plan of care. LTG upgraded to supervision due to good progress.   OT Short Term Goals Week 2:  OT Short Term Goal 1 (Week 2): Pt will complete 2 of 5 grooming tasks with setup, min vc and hand guidance to initiate task. OT Short Term Goal 1 - Progress (Week 2): Met OT Short Term Goal 2 (Week 2): Pt will participate in select activity,  sustaining focused attention for 5 minutes during functional task. OT Short Term Goal 2 - Progress (Week 2): Met OT Short Term Goal 3 (Week 2): Pt will thread BLE into pants with mn verbal cues OT Short Term Goal 3 - Progress (Week 2): Met OT Short Term Goal 4 (Week 2): Pt will initiate bathing tasks with min verbal cues  OT Short Term Goal 4 - Progress (Week 2): Met OT Short Term Goal 5 (Week 2): Pt will perform toilet transfers with mod A +1 OT Short Term Goal 5 - Progress (Week 2): Met Week 3:  OT Short Term Goal 1 (Week 3): Pt will perform toilet transfers with supervision OT Short Term Goal 2 (Week 3): Pt will complete bathing tasks with supervision and min verbal cues for initiattion OT Short Term Goal 3 (Week 3): Pt will complete LB dressing tasks with supervision and min verbal cues for initiation OT Short Term Goal 4 (Week 3): Pt will perform grooming tasks standing at sink with supervision and min verbal cues for initiation  Skilled Therapeutic Interventions/Progress Updates:      Therapy Documentation Precautions:  Precautions Precautions: Fall, Cervical Precaution Comments: trach and peg, trach collar vs vent  Restrictions Weight Bearing Restrictions: No  See Function Navigator for Current Functional Status.     Leotis Shames [facility] 09/04/2016, 8:06 AM

## 2016-09-05 ENCOUNTER — Inpatient Hospital Stay (HOSPITAL_COMMUNITY): Payer: Medicaid Other

## 2016-09-05 ENCOUNTER — Inpatient Hospital Stay (HOSPITAL_COMMUNITY): Payer: Medicaid Other | Admitting: Speech Pathology

## 2016-09-05 ENCOUNTER — Inpatient Hospital Stay (HOSPITAL_COMMUNITY): Payer: Self-pay

## 2016-09-05 ENCOUNTER — Inpatient Hospital Stay (HOSPITAL_COMMUNITY): Payer: Self-pay | Admitting: Physical Therapy

## 2016-09-05 ENCOUNTER — Inpatient Hospital Stay (HOSPITAL_COMMUNITY): Payer: Medicaid Other | Admitting: Physical Therapy

## 2016-09-05 LAB — GLUCOSE, CAPILLARY
GLUCOSE-CAPILLARY: 137 mg/dL — AB (ref 65–99)
GLUCOSE-CAPILLARY: 150 mg/dL — AB (ref 65–99)
GLUCOSE-CAPILLARY: 98 mg/dL (ref 65–99)
Glucose-Capillary: 123 mg/dL — ABNORMAL HIGH (ref 65–99)
Glucose-Capillary: 77 mg/dL (ref 65–99)
Glucose-Capillary: 108 mg/dL — ABNORMAL HIGH (ref 65–99)

## 2016-09-05 NOTE — Plan of Care (Signed)
Problem: RH Balance Goal: LTG Patient will maintain dynamic standing with ADLs (OT) LTG:  Patient will maintain dynamic standing balance with assist during activities of daily living (OT)   Upgraded goal due to progress  Problem: RH Bathing Goal: LTG Patient will bathe with assist, cues/equipment (OT) LTG: Patient will bathe specified number of body parts with assist with/without cues using equipment (position)  (OT)  Upgraded due to progress  Problem: RH Dressing Goal: LTG Patient will perform upper body dressing (OT) LTG Patient will perform upper body dressing with assist, with/without cues (OT).  Upgraded due to progress Goal: LTG Patient will perform lower body dressing w/assist (OT) LTG: Patient will perform lower body dressing with assist, with/without cues in positioning using equipment (OT)  Upgraded due to progress

## 2016-09-05 NOTE — Progress Notes (Signed)
Speech Language Pathology Daily Session Note  Patient Details  Name: [name] MRN: [id number] Date of Birth: Jul 19, 1980  Today's Date: 09/05/2016 SLP Individual Time: [id number] SLP Individual Time Calculation (min): 20 min  Short Term Goals: Week 3: SLP Short Term Goal 1 (Week 3): Patient will verbalize at the word level in 25% of opportunities with Max A multimodal cues.  SLP Short Term Goal 2 (Week 3): Patient will demonstrate sustained attention to task for ~2 minutes with Max A multimodal cues.  SLP Short Term Goal 3 (Week 3): Patient will initiate functional tasks with Max A verbal cues in 75% of opportunities  SLP Short Term Goal 4 (Week 3): Patient will consume trials of ice chips with Min assist multimodal cues for initiation of swallow sequence without overt s/s of aspiration prior to diet initiation   Skilled Therapeutic Interventions: Skilled treatment session focused on addressing dysphagia goals. SLP facilitated session by providing set-up with 50% of presented items.  Patient consumed Dys.1 textures and thin liquids via cup with Min cues to initiate and switch between tasks, but patient able to self-feed with no overt s/s of aspiration.  SLP also facilitated session with Min verbal cues for portion control and pacing for overall safety.  Continue with current plan of care.    Function:  Eating Eating   Modified Consistency Diet: Yes Eating Assist Level: Supervision or verbal cues;Helper checks for pocketed food;More than reasonable amount of time;Set up assist for   Eating Set Up Assist For: Opening containers       Cognition Comprehension Comprehension assist level: Understands basic 50 - 74% of the time/ requires cueing 25 - 49% of the time  Expression   Expression assist level: Expresses basic 25 - 49% of the time/requires cueing 50 - 75% of the time. Uses single words/gestures.  Social Interaction Social Interaction assist level: Interacts appropriately 25 -  49% of time - Needs frequent redirection.  Problem Solving Problem solving assist level: Solves basic less than 25% of the time - needs direction nearly all the time or does not effectively solve problems and may need a restraint for safety  Memory Memory assist level: Recognizes or recalls less than 25% of the time/requires cueing greater than 75% of the time    Pain Pain Assessment Pain Assessment: No/denies pain  Therapy/Group: Individual Therapy  Charlane FerrettiMelissa Shye Doty, M.A., CCC-SLP [phone]  [name] 09/05/2016, 5:03 PM

## 2016-09-05 NOTE — Progress Notes (Signed)
Physical Therapy Session Note  Patient Details  Name: Elmon ElseDarren T Kelner MRN: [id number] Date of Birth: 10/28/1980  Today's Date: 09/05/2016 PT Individual Time: 0810-0905 PT Individual Time Calculation (min): 55 min   Short Term Goals: Week 3:  PT Short Term Goal 1 (Week 3): Pt will attend to functional task x 5 minutes with min cuing PT Short Term Goal 2 (Week 3): Pt will gait with min A 150' in controlled environment  Skilled Therapeutic Interventions/Progress Updates:  Pt received in w/c with RN completing tube feed. Session focused on cognitive remediation, R inattention, and functional mobility. Pt ambulated throughout unit without AD with steady assist<>supervision but requiring max multimodal cuing for directional changes. Pt continues to require cuing for obstacle avoidance as well. Pt negotiated 12 steps (6" + 3") with B rails & steady assist. Pt with improving initiation of transfers, only requiring occasional min tactile cuing for sit>stand. Attempted to engage pt in sorting cards, dynavision, and spelling name with block letters. Pt with poor attention to task (5-10 seconds max at a time). Pt only able to correctly sort 1 card and required [facility] to push button on dynavision 2/2 visuospatial impairments. Pt internally distracted throughout session, consistently fidgeting with his hair and his pants, requiring max cuing to redirect. Pt continues to remain nonverbal during session and with very little engagement of nodding head yes/no to respond to questions. At end of session pt left sitting in w/c with QRB donned & chair alarm in place (per RN request); pt left sitting at nurses station.    Therapy Documentation Precautions:  Precautions Precautions: Fall, Cervical Precaution Comments: trach and peg, trach collar vs vent  Restrictions Weight Bearing Restrictions: No  Pain: Faces - no hurt.   See Function Navigator for Current Functional Status.   Therapy/Group: Individual  Therapy  Sandi MariscalVictoria M Vicktoria Muckey 09/05/2016, 9:27 AM

## 2016-09-05 NOTE — Progress Notes (Signed)
Physical Therapy Session Note  Patient Details  Name: [name] MRN: [id number] Date of Birth: August 14, 1980  Today's Date: 09/05/2016 PT Individual Time: 1500-1600 PT Individual Time Calculation (min): 60 min   Short Term Goals: Week 3:  PT Short Term Goal 1 (Week 3): Pt will attend to functional task x 5 minutes with min cuing PT Short Term Goal 2 (Week 3): Pt will gait with min A 150' in controlled environment  Skilled Therapeutic Interventions/Progress Updates:    no c/o pain.  Session focus on balance, functional mobility, and attention to task.  Pt transitions supine>sit with min tactile cues and dons shorts and shoes with supervision and increased time at sit<>stand level.  Gait throughout unit, max distance 300', with min guard, pt with increasing antalgic gait with decreased weight bearing/stance time on RLE but denies pain.  Attempted to engage pt in bug puzzle but pt only moves pieces around for 10-15 seconds before becoming distracted by other pt's in the gym.  Moved to ADL kitchen and attempted peg board puzzle.  Pt unable to recreate pattern from picture, but able to attend to task of placing green pegs in holes for 5 minutes if handed piece by therapist.  Pt returned to room at end of session and positioned in recliner with family member present.    Therapy Documentation Precautions:  Precautions Precautions: Fall, Cervical Precaution Comments: trach and peg, trach collar vs vent  Restrictions Weight Bearing Restrictions: No  See Function Navigator for Current Functional Status.   Therapy/Group: Individual Therapy  Ladora DanielCaitlin E Penven-Crew 09/05/2016, 4:49 PM

## 2016-09-05 NOTE — Progress Notes (Signed)
Occupational Therapy Session Note  Patient Details  Name: [name] MRN: [id number] Date of Birth: [birth year]  Today's Date: 09/05/2016 OT Individual Time: 0700-0800 OT Individual Time Calculation (min): 60 min    Short Term Goals: Week 2:  OT Short Term Goal 1 (Week 2): Pt will complete 2 of 5 grooming tasks with setup, min vc and hand guidance to initiate task. OT Short Term Goal 1 - Progress (Week 2): Met OT Short Term Goal 2 (Week 2): Pt will participate in select activity, sustaining focused attention for 5 minutes during functional task. OT Short Term Goal 2 - Progress (Week 2): Met OT Short Term Goal 3 (Week 2): Pt will thread BLE into pants with mn verbal cues OT Short Term Goal 3 - Progress (Week 2): Met OT Short Term Goal 4 (Week 2): Pt will initiate bathing tasks with min verbal cues  OT Short Term Goal 4 - Progress (Week 2): Met OT Short Term Goal 5 (Week 2): Pt will perform toilet transfers with mod A +1 OT Short Term Goal 5 - Progress (Week 2): Met Week 3:  OT Short Term Goal 1 (Week 3): Pt will perform toilet transfers with supervision OT Short Term Goal 2 (Week 3): Pt will complete bathing tasks with supervision and min verbal cues for initiattion OT Short Term Goal 3 (Week 3): Pt will complete LB dressing tasks with supervision and min verbal cues for initiation OT Short Term Goal 4 (Week 3): Pt will perform grooming tasks standing at sink with supervision and min verbal cues for initiation  Skilled Therapeutic Interventions/Progress Updates:    Pt resting in w/c upon arrival.  Pt engaged in BADL retraining including toileting, bathing at shower level, and dressing with sit<>stand from w/c at sink.  Pt requires max verbal cues to initiate sit<>stand and amb into bathroom for toileting and transfer to tub bench.  Pt requires max multimodal cues for initiating bathing tasks and transitioning between segments of bathing tasks.  Pt continues to perseverate on bathing tasks.  Pt initiates all dressing tasks when presented with clothing items.  Pt required assistance fastening his shoes.  Pt stood at sink for grooming tasks, requiring max multimodal cues to initiate tasks.  Focus on activity tolerance, task initiation, sequencing, cognitive remediation, standing balance, and safety awareness to increase independence with BADLs.  Therapy Documentation Precautions:  Precautions Precautions: Fall, Cervical Precaution Comments: peg,  Restrictions Weight Bearing Restrictions: No Pain:  Pt with no s/s of pain  See Function Navigator for Current Functional Status.   Therapy/Group: Individual Therapy  Leroy Libman 09/05/2016, 8:43 AM
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
## 2016-09-05 NOTE — Progress Notes (Signed)
Speech Language Pathology Note  Patient Details  Name: [name] MRN: [id number] Date of Birth: 10-Jan-1981 Today's Date: 09/05/2016  MBSS complete. Full report located under chart review in imaging section.    Lesa Vandall 09/05/2016, 3:53 PM

## 2016-09-05 NOTE — Progress Notes (Signed)
Patient left unit for MBS.  Notified transport team about chair alarm.  Dani Gobbleeardon, Isatu Macinnes J, RN

## 2016-09-05 NOTE — Progress Notes (Signed)
Physical Therapy Note  Patient Details  Name: [name] T Weninger MRN: [id number] Date of Birth: Feb 24, 1981 Today's Date: 09/05/2016    Time: 1345-1438 53 minutes  1:1 No signs/symtoms of pain.  Pt performs gait throughout unit with supervision, min A for direction following.  Pt engaged in drawing activity. Able to recreate shapes that PT draws x 2, then perseverating on certain shapes. Pt able to write name and city where he lives with verbal cues only. Difficulty switching tasks due to perseveration.  Basketball toss with chest pass and bounce pass, pt chooses to do this activity seated.  Stair negotiation with supervision with visual cues and increased time. Pt able to make the bed in ADL apartment with supervision cuing and supervision, no LOB. Pt then gait to his room and takes off pants and shoes with min A and lays in bed. [name] present.  Siomara Burkel 09/05/2016, 2:38 PM

## 2016-09-06 ENCOUNTER — Inpatient Hospital Stay (HOSPITAL_COMMUNITY): Payer: Medicaid Other | Admitting: Occupational Therapy

## 2016-09-06 ENCOUNTER — Inpatient Hospital Stay (HOSPITAL_COMMUNITY): Payer: Medicaid Other | Admitting: Speech Pathology

## 2016-09-06 ENCOUNTER — Inpatient Hospital Stay (HOSPITAL_COMMUNITY): Payer: Medicaid Other | Admitting: Physical Therapy

## 2016-09-06 ENCOUNTER — Inpatient Hospital Stay (HOSPITAL_COMMUNITY): Payer: Self-pay

## 2016-09-06 LAB — GLUCOSE, CAPILLARY
GLUCOSE-CAPILLARY: 117 mg/dL — AB (ref 65–99)
GLUCOSE-CAPILLARY: 133 mg/dL — AB (ref 65–99)
Glucose-Capillary: 82 mg/dL (ref 65–99)
Glucose-Capillary: 76 mg/dL (ref 65–99)
Glucose-Capillary: 91 mg/dL (ref 65–99)

## 2016-09-06 MED ORDER — JEVITY 1.5 CAL/FIBER PO LIQD
325.0000 mL | Freq: Three times a day (TID) | ORAL | Status: DC
  Administered 2016-09-06 – 2016-09-07 (×2): 325 mL
  Filled 2016-09-06 (×12): qty 1000

## 2016-09-06 NOTE — Progress Notes (Signed)
Physical Therapy Session Note  Patient Details  Name: [name] MRN: [id number] Date of Birth: Jan 03, 1981  Today's Date: 09/06/2016 PT Individual Time: [id number] and [id number] PT Individual Time Calculation (min): 45 min and 55 min  Short Term Goals: Week 3:  PT Short Term Goal 1 (Week 3): Pt will attend to functional task x 5 minutes with min cuing PT Short Term Goal 2 (Week 3): Pt will gait with min A 150' in controlled environment  Skilled Therapeutic Interventions/Progress Updates:  Treatment 1: Pt received in w/c. Session focused on functional mobility and cognitive remediation (initiation/terminiation of tasks, attention, awareness). Pt ambulated throughout unit without AD & supervision, but does require tactile cuing for path finding & directional changes as pt unable to distinguish Left/Right. Pt able to initiate donning boxing gloves and punching boxing bag with min cuing. Pt able to attend to task for ~30-60 seconds at a time in a minimally distracting environment. Attempted to engage pt in sorting red/black chips into two piles in a quiet, controlled environment. Pt with poor attention to task, as he was internally distracted by his hair, and was unable to select correct chip from choice of two. Pt utilized nu-step with BLE only on level 4 x 7 minutes 45 seconds with max cuing to continue to participate in task as pt would frequently attempt to stop and stand up. At end of session pt left sitting in w/c with QRB & chair alarm donned; pt left sitting at nurses station.   Treatment 2: Pt received in bed. Session focused on functional mobility & cognitive remediation. Pt transferred supine<>sitting EOB with supervision and extra time to initiate task. Pt able to don socks & shoes with moderate cuing to initiate task. Pt ambulated throughout unit; pt is able to ambulate with supervision but requires consistent tactile cuing for pathfinding and direction changes. Attempted to engage pt in  dynavision and pt able to attend to task for 15-30 seconds at a time with max cuing; pt required max multimodal cuing to find appropriate light and press it. Attempted to engage pt in stacking cups but pt unable to copy therapist's demonstration. Pt engaged in arranging months of year in order, correctly selecting month from choice of two, 30% of the time. Pt continues to perseverate on various tasks & appears to be internally distracted, thus requiring max cuing to attend to task. Pt able to recall he has 4 kids from choice of two. Pt completed toilet transfer & clothing management with supervision but pt without void or BM. At end of session pt left in bed with all needs within reach and bed alarm set.   Therapy Documentation Precautions:  Precautions Precautions: Fall, Cervical Precaution Comments: trach and peg, trach collar vs vent  Restrictions Weight Bearing Restrictions: No   General: PT Amount of Missed Time (min): 15 Minutes PT Missed Treatment Reason:  (therapist late)   Pain: Faces - no hurt.   See Function Navigator for Current Functional Status.   Therapy/Group: Individual Therapy  Sandi MariscalVictoria M Orion Vandervort 09/06/2016, 4:56 PM

## 2016-09-06 NOTE — Progress Notes (Signed)
Occupational Therapy Session Note  Patient Details  Name: [name] MRN: [id number] Date of Birth: 22-Jan-1981  Today's Date: 09/06/2016 OT Individual Time: 1400-1430 OT Individual Time Calculation (min): 30 min    Short Term Goals: Week 3:  OT Short Term Goal 1 (Week 3): Pt will perform toilet transfers with supervision OT Short Term Goal 2 (Week 3): Pt will complete bathing tasks with supervision and min verbal cues for initiattion OT Short Term Goal 3 (Week 3): Pt will complete LB dressing tasks with supervision and min verbal cues for initiation OT Short Term Goal 4 (Week 3): Pt will perform grooming tasks standing at sink with supervision and min verbal cues for initiation  Skilled Therapeutic Interventions/Progress Updates:    Upon entering the room, pt seated in wheelchair with no sign or symptoms of pain. Pt perseverating on braiding/unbraiding hair this session but redirected with max cues. Pt ambulated to dayroom without use of AD and steady assistance. Pt handed bowling ball and placed fingers in holes as demonstrated with increased time and mod demonstrational cues. Pt initiated rolling ball towards bowling pins on floor x 2 with increased time of 4 minutes total. OT handing pins to pt who grabbed them with R UE and placed in tote to clean up area. Pt given water pitcher for plants and pt ambulated to family room to fill up with mod multimodal cues and increased time. Pt returning to plant and watering with increased time only. Pt returning to room and placed self on toilet with supervision for clothing management. Pt did not void this session. He returned to bed at end of session. Bed alarm activated and all needs within reach.   Therapy Documentation Precautions:  Precautions Precautions: Fall, Cervical Precaution Comments:   Restrictions Weight Bearing Restrictions: No General:   Vital Signs: Therapy Vitals Temp: 98.8 F (37.1 C) Temp Source: Oral Pulse Rate:  61 Resp: 18 BP: 135/78 Patient Position (if appropriate): Sitting Oxygen Therapy SpO2: 100 % O2 Device: Not Delivered Pain: Pain Assessment Pain Assessment: Faces Pain Score: 0-No pain ADL: ADL ADL Comments: see Functional Assessment Tool Exercises:   Other Treatments:    See Function Navigator for Current Functional Status.   Therapy/Group: Individual Therapy  [name], Shaelee Forni P 09/06/2016, 5:03 PM
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
## 2016-09-06 NOTE — Progress Notes (Signed)
Speech Language Pathology Daily Session Note  Patient Details  Name: Elmon ElseDarren T Siordia MRN: [id number] Date of Birth: December 20, 1980  Today's Date: 09/06/2016 SLP Individual Time: 1300-1345 SLP Individual Time Calculation (min): 45 min  Short Term Goals: Week 3: SLP Short Term Goal 1 (Week 3): Patient will verbalize at the word level in 25% of opportunities with Max A multimodal cues.  SLP Short Term Goal 2 (Week 3): Patient will demonstrate sustained attention to task for ~2 minutes with Max A multimodal cues.  SLP Short Term Goal 3 (Week 3): Patient will initiate functional tasks with Max A verbal cues in 75% of opportunities  SLP Short Term Goal 4 (Week 3): Patient will consume trials of ice chips with Min assist multimodal cues for initiation of swallow sequence without overt s/s of aspiration prior to diet initiation   Skilled Therapeutic Interventions: Skilled treatment session focused on dysphagia and cognitive goals. SLP facilitated session by providing set-up assist for trial tray of Dys. 1 textures with thin liquids via cup. Patient initiated self-feeding with supervision verbal cues and required Max A verbal cues for use of small bites/sips. Patient consumed meal with what appeared to be a more timely swallow initiation without overt s/s of aspiration, therefore, recommend patient initiate a diet of Dys. 1 textures with thin liquids with full supervision. Patient independently initiated standing and ambulated with Mod verbal cues for navigation throughout obstacles in hallway. Patient continent of urine and required Total A for problem solving with washing hands at sink. Patient left upright in wheelchair with family present. Continue with current plan of care.      Function:  Eating Eating   Modified Consistency Diet: Yes Eating Assist Level: Supervision or verbal cues;More than reasonable amount of time;Set up assist for   Eating Set Up Assist For: Opening containers        Cognition Comprehension Comprehension assist level: Understands basic 50 - 74% of the time/ requires cueing 25 - 49% of the time  Expression   Expression assist level: Expresses basic 25 - 49% of the time/requires cueing 50 - 75% of the time. Uses single words/gestures.  Social Interaction Social Interaction assist level: Interacts appropriately 25 - 49% of time - Needs frequent redirection.  Problem Solving Problem solving assist level: Solves basic less than 25% of the time - needs direction nearly all the time or does not effectively solve problems and may need a restraint for safety  Memory Memory assist level: Recognizes or recalls less than 25% of the time/requires cueing greater than 75% of the time    Pain No/Denies Pain   Therapy/Group: Individual Therapy  Athalie Newhard 09/06/2016, 3:14 PM

## 2016-09-06 NOTE — Progress Notes (Signed)
Occupational Therapy Session Note  Patient Details  Name: [name] MRN: [id number] Date of Birth: 25-Jan-1981  Today's Date: 09/06/2016 OT Individual Time: 1446-1530 OT Individual Time Calculation (min): 44 min    Short Term Goals: Week 3:  OT Short Term Goal 1 (Week 3): Pt will perform toilet transfers with supervision OT Short Term Goal 2 (Week 3): Pt will complete bathing tasks with supervision and min verbal cues for initiattion OT Short Term Goal 3 (Week 3): Pt will complete LB dressing tasks with supervision and min verbal cues for initiation OT Short Term Goal 4 (Week 3): Pt will perform grooming tasks standing at sink with supervision and min verbal cues for initiation  Skilled Therapeutic Interventions/Progress Updates:    Session began in supine with pt asleep.  He was easily aroused to verbal stimuli.  Min demonstrational cueing to transfer to sitting position.  After sitting he transitioned to standing and attempted to remove pants as if he needed to toilet.  Therapist re-directed him to pull pants up and then ambulate to the bathroom for toileting.  Once in the bathroom he needed min demonstrational cueing to turn, pull down brief and clothing, and then sit on the toilet.  After sitting for a few mins he was able to successfully have a BM.  When standing up he did not attempt peri hygiene and would not follow cueing to attempt so therapist provided max assist.  He was able to pull up brief and pants with supervision after this.  He ambulated out to the edge of the bed again before therapist re-directed him verbally to wash his hands at the sink.  He was able to complete this but did not attempt use of soap.  He next sat EOB and donned his shoes with setup and min demonstrational cueing.  After donning them he immediately attempted to take them off so therapist had to provide demonstrational cueing to keep them on.  Progressed to functional mobility in the hallway with supervision to  the therapy gym.  Once in the gym had pt engage in ball toss task.  He demonstrated ability to catch the ball 75% of the time and tossed it back to the therapist.  Progressed to ambulation in the hallway while catching it and tossing it back.  During ambulation he was only able to catch it 50% of the time, and when he dropped it, he needed mod demonstrational cueing to pick it back up.  Ambulated to the ADL apartment where pt sat on EOB.  He removed shoes again and attempted to pull down his pants, with therapist having to re-direct him to not do this.  He donned his shoes with mod demonstrational cueing and then therapist handed him a pillow and pillowcase.  He needed mod assist to donn the first pillowcase but then he was able to donn the second with setup only. Next had pt ambulate to the dayroom while tossing the ball again.  Brief rest break taken in the dayroom before ambulating back to his room.  He needed mod demonstrational cueing to turn down the hallway toward his room but then he was able to locate his room on the right and ambulate to his bed.  He removed his shoes once sitting and then needed mod demonstrational cueing and increased time to process laying down.  Pt left in bed with alarm in place and call button in reach.    Therapy Documentation Precautions:  Precautions Precautions: Fall, Cervical Precaution Comments:  Restrictions Weight Bearing Restrictions: No  Vital Signs: BP: 135/78  Pain: Pain Assessment Pain Assessment: Faces Pain Score: 0-No pain ADL: See Function Navigator for Current Functional Status.   Therapy/Group: Individual Therapy  [name] OTR/L 09/06/2016, 4:46 PM

## 2016-09-06 NOTE — Progress Notes (Signed)
Occupational Therapy Session Note  Patient Details  Name: [name] T Auston MRN: [id number] Date of Birth: 07/18/1980  Today's Date: 09/06/2016 OT Individual Time: [id number] OT Individual Time Calculation (min): 54 min    Short Term Goals: Week 3:  OT Short Term Goal 1 (Week 3): Pt will perform toilet transfers with supervision OT Short Term Goal 2 (Week 3): Pt will complete bathing tasks with supervision and min verbal cues for initiattion OT Short Term Goal 3 (Week 3): Pt will complete LB dressing tasks with supervision and min verbal cues for initiation OT Short Term Goal 4 (Week 3): Pt will perform grooming tasks standing at sink with supervision and min verbal cues for initiation  Skilled Therapeutic Interventions/Progress Updates:    Pt asleep in bed upon arrival but easily aroused.  Pt sat EOB and amb to bathroom to use toilet prior to taking a shower.  Pt continues to required max verbal cues for initiating bathing tasks and sequencing.  Pt continues to perseverate on bathing tasks.  Pt returned to room and engaged in dressing tasks.  Pt dons clothing, socks, and shoes when presented with items.  Pt was able to tie his shoes on command this morning.  Pt requires more than a reasonable amount of time to initiate tasks.  Pt stood at sink to perform grooming tasks.  Focus on activity tolerance, sit<>stand, standing balance, functional amb, task initiation, sequencing, and safety awareness to increase independence with BADLs.   Therapy Documentation Precautions:  Precautions Precautions: Fall, Cervical Precaution Comments: trach and peg, trach collar vs vent  Restrictions Weight Bearing Restrictions: No Pain:  Pt with no s/s of pain  See Function Navigator for Current Functional Status.   Therapy/Group: Individual Therapy  Rich BraveLanier, Vester Titsworth Chappell 09/06/2016, 7:57 AM
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
## 2016-09-07 ENCOUNTER — Inpatient Hospital Stay (HOSPITAL_COMMUNITY): Payer: Medicaid Other | Admitting: Physical Therapy

## 2016-09-07 ENCOUNTER — Inpatient Hospital Stay (HOSPITAL_COMMUNITY): Payer: Medicaid Other | Admitting: Occupational Therapy

## 2016-09-07 ENCOUNTER — Inpatient Hospital Stay (HOSPITAL_COMMUNITY): Payer: Medicaid Other

## 2016-09-07 ENCOUNTER — Inpatient Hospital Stay (HOSPITAL_COMMUNITY): Payer: Self-pay | Admitting: Occupational Therapy

## 2016-09-07 ENCOUNTER — Inpatient Hospital Stay (HOSPITAL_COMMUNITY): Payer: Medicaid Other | Admitting: Speech Pathology

## 2016-09-07 LAB — GLUCOSE, CAPILLARY
GLUCOSE-CAPILLARY: 112 mg/dL — AB (ref 65–99)
GLUCOSE-CAPILLARY: 128 mg/dL — AB (ref 65–99)
GLUCOSE-CAPILLARY: 166 mg/dL — AB (ref 65–99)
GLUCOSE-CAPILLARY: 99 mg/dL (ref 65–99)
Glucose-Capillary: 108 mg/dL — ABNORMAL HIGH (ref 65–99)
Glucose-Capillary: 156 mg/dL — ABNORMAL HIGH (ref 65–99)
Glucose-Capillary: 79 mg/dL (ref 65–99)
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
## 2016-09-07 NOTE — Progress Notes (Signed)
Physical Therapy Session Note  Patient Details  Name: [name] MRN: [id number] Date of Birth: 06/22/1981  Today's Date: 09/07/2016 PT Individual Time: 1525-1610 PT Individual Time Calculation (min): 45 min   Short Term Goals:  Week 2:  PT Short Term Goal 1 (Week 2): Pt will consistently perform transfers with +1 assist PT Short Term Goal 1 - Progress (Week 2): Met PT Short Term Goal 2 (Week 2): Pt will follow 1 step commands 50% of the time PT Short Term Goal 2 - Progress (Week 2): Progressing toward goal  Skilled Therapeutic Interventions/Progress Updates: PT donned pt's shoes while he sat EOB; he assisted by placing his feet fully into shoes.  Gait to toilet; pt sat on toilet but did not void.  Gait on level tile to/from therapy with close supervision, min guard assist, cues for route finding and attending to task. Neuromuscular re-education via forced use, max encouragement to use Nustep for alternating reciprocal movement x 4 extremities x 10 minutes at level 4.  Pt continually removed UEs and /or LEs  from NuStep as he lost focus, but was easily redirected.  Standing activities attempting to put together pipe tree puzzle; pt chose many pieces and put them together randomly, without attending to picture.  He folded 4 towels, 4 pillow cases and sorted them by category to stack on a table.  Pt had to be redirected many times during this task.  Pt appeared fatigued, and sat to rest.  From sitting, pt moved into quadruped position on mat, with demo by this PT.  He moved from 4 pt to 3 pt to reach out for 5 small items each R/L hand. Pt transported linens back to room while ambulating, without spillage, and followed 1 step directions as to placement of each stack, within his room.  Pt left resting in bed with 4 rails up, bed alarm set, and all needs within reach.     Therapy Documentation Precautions:  Precautions Precautions: Fall,   Pain: no signs/symptoms of pain      See Function  Navigator for Current Functional Status.   Therapy/Group: Individual Therapy  Carolin Quang 09/07/2016, 4:26 PM

## 2016-09-07 NOTE — Progress Notes (Signed)
Occupational Therapy Session Note  Patient Details  Name: [name] MRN: [id number] Date of Birth: 1980-12-23  Today's Date: 09/07/2016 OT Individual Time: 1400-1445 OT Individual Time Calculation (min): 45 min    Short Term Goals: Week 3:  OT Short Term Goal 1 (Week 3): Pt will perform toilet transfers with supervision OT Short Term Goal 2 (Week 3): Pt will complete bathing tasks with supervision and min verbal cues for initiattion OT Short Term Goal 3 (Week 3): Pt will complete LB dressing tasks with supervision and min verbal cues for initiation OT Short Term Goal 4 (Week 3): Pt will perform grooming tasks standing at sink with supervision and min verbal cues for initiation  Skilled Therapeutic Interventions/Progress Updates:   Pt received at RN station. He ambulated to room with supervision. 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 However, pt began to attempt to don clothing items by placing B LE's through shirt and then donning a second pair of pants over clothing. Pt looking into mirror with extra clothing and sitting onto bed to remove and hand to therapist. Pt engaged in writing task where he correctly wrote his entire name and then the name of his daughter correctly but then began to perseverate and wrote letters multiple times until redirected. OT drew triangle and pt unable to do anything but draw circle initially. Hand over hand to trace triangle and then pt making only one and able to terminate task at that time. Pt returns to room and begins removing all of clothing and having BM in bathroom. Pt performed hygiene when handed toilet paper as prompt. Pt returned to bed at end of session with call bell and all needed items within reach.   Therapy Documentation Precautions:  Precautions Precautions: Fall, Cervical Precaution Comments:   Restrictions Weight Bearing Restrictions: No General:   Vital Signs: Therapy Vitals Temp: 99.5 F  (37.5 C) Temp Source: Oral Pulse Rate: 67 Resp: 18 BP: 120/83 Patient Position (if appropriate): Sitting Oxygen Therapy SpO2: 97 % O2 Device: Not Delivered Pain:   ADL: ADL ADL Comments: see Functional Assessment Tool Exercises:   Other Treatments:    See Function Navigator for Current Functional Status.   Therapy/Group: Individual Therapy  [name], Dontavian Marchi P 09/07/2016, 5:01 PM

## 2016-09-07 NOTE — Progress Notes (Signed)
Physical Therapy Session Note  Patient Details  Name: [name] MRN: [id number] Date of Birth: [birth year]  Today's Date: 09/07/2016 PT Individual Time: 1032-1100 PT Individual Time Calculation (min): 28 min   Short Term Goals: Week 3:  PT Short Term Goal 1 (Week 3): Pt will attend to functional task x 5 minutes with min cuing PT Short Term Goal 2 (Week 3): Pt will gait with min A 150' in controlled environment  Skilled Therapeutic Interventions/Progress Updates:      Pt received sitting in WC and agreeable to PT. PT treatment focused on sustained attention and orientation to familiar environment. Gait around unit and over various surfaces including ramp, mulched surface, carpet, and tile. Patient had one near LOB on step down 6 inch curb. PT instructed patient in [employer] x 10 minutes. Patient unable to accurately find target for >5% of time. Patient returned to room and left sitting in [facility] with call bell in reach and all needs met.      Therapy Documentation Precautions:  Precautions Precautions: Fall, Cervical Precaution Comments:   Restrictions Weight Bearing Restrictions: No    Pain: 0/10   See Function Navigator for Current Functional Status.   Therapy/Group: Individual Therapy  Lorie Phenix 09/07/2016, 12:53 PM

## 2016-09-07 NOTE — Progress Notes (Signed)
Occupational Therapy Session Note  Patient Details  Name: [name] MRN: [id number] Date of Birth: [birth year]  Today's Date: 09/07/2016 OT Individual Time: 0900-1000 OT Individual Time Calculation (min): 60 min    Short Term Goals: Week 3:  OT Short Term Goal 1 (Week 3): Pt will perform toilet transfers with supervision OT Short Term Goal 2 (Week 3): Pt will complete bathing tasks with supervision and min verbal cues for initiattion OT Short Term Goal 3 (Week 3): Pt will complete LB dressing tasks with supervision and min verbal cues for initiation OT Short Term Goal 4 (Week 3): Pt will perform grooming tasks standing at sink with supervision and min verbal cues for initiation Week 4:     Skilled Therapeutic Interventions/Progress Updates: Self care retraining at shower level. Focus on allowing pt more space to navigate around room and follow through with familiar routine self care tasks. Pt ambulated around the room with close to more distant supervision. Pt chose clothing with mod to max questioning cues. Pt continues to require cuing for attention to right visual field throughout tasks. Pt able to open doors without cuing to navigate in and out of bathroom. Pt also showered in standing today with more automatic behaviors to initiate bathing self once soap was on washcloth but required max cues to complete LB bathing. Pt dressed sit to stand from EOB without cuing- did requiring cuing to navigate to sink to don deodorant . Pt  Able to fasten both shoes.  Pt did smile to "silly" comments therapist made today.  Fiance and daughter present for session.      Therapy Documentation Precautions:  Precautions Precautions: Fall, Cervical Precaution Comments:   Restrictions Weight Bearing Restrictions: No Pain: No indication of pain See Function Navigator for Current Functional Status.   Therapy/Group: Individual Therapy  Roney MansSmith, Rashanna Christiana Wake Forest Joint Ventures LLCynsey 09/07/2016, 10:03 AM

## 2016-09-07 NOTE — Progress Notes (Signed)
Speech Language Pathology Daily Session Note  Patient Details  Name: [name] MRN: [id number] Date of Birth: 1980/10/11  Today's Date: 09/07/2016 SLP Individual Time: 0830-0900 SLP Individual Time Calculation (min): 30 min  Short Term Goals: Week 3: SLP Short Term Goal 1 (Week 3): Patient will verbalize at the word level in 25% of opportunities with Max A multimodal cues.  SLP Short Term Goal 2 (Week 3): Patient will demonstrate sustained attention to task for ~2 minutes with Max A multimodal cues.  SLP Short Term Goal 3 (Week 3): Patient will initiate functional tasks with Max A verbal cues in 75% of opportunities  SLP Short Term Goal 4 (Week 3): Patient will consume trials of ice chips with Min assist multimodal cues for initiation of swallow sequence without overt s/s of aspiration prior to diet initiation   Skilled Therapeutic Interventions: Skilled treatment session focused on dysphagia and cognitive goals. SLP facilitated session by providing Mod-Max A verbal cues for problem solving with tray set-up and self-feeding. Patient consumed breakfast meal of Dys. 1 textures with thin liquids via cup without overt s/s of aspiration and required Max A verbal cues for use of small bites/sips. Patient continues to be nonverbal despite Max A multimodal cues. Patient left upright in wheelchair with family present. Continue with current plan of care.      Function:  Eating Eating   Modified Consistency Diet: Yes Eating Assist Level: Supervision or verbal cues;More than reasonable amount of time;Set up assist for   Eating Set Up Assist For: Opening containers       Cognition Comprehension Comprehension assist level: Understands basic 25 - 49% of the time/ requires cueing 50 - 75% of the time  Expression   Expression assist level: Expresses basis less than 25% of the time/requires cueing >75% of the time.  Social Interaction Social Interaction assist level: Interacts appropriately 25 -  49% of time - Needs frequent redirection.  Problem Solving Problem solving assist level: Solves basic less than 25% of the time - needs direction nearly all the time or does not effectively solve problems and may need a restraint for safety  Memory Memory assist level: Recognizes or recalls less than 25% of the time/requires cueing greater than 75% of the time    Pain No/Denies Pain   Therapy/Group: Individual Therapy  Angeleen Horney 09/07/2016, 2:50 PM

## 2016-09-08 ENCOUNTER — Inpatient Hospital Stay (HOSPITAL_COMMUNITY): Payer: Self-pay

## 2016-09-08 ENCOUNTER — Inpatient Hospital Stay (HOSPITAL_COMMUNITY): Payer: Self-pay | Admitting: Physical Therapy

## 2016-09-08 ENCOUNTER — Inpatient Hospital Stay (HOSPITAL_COMMUNITY): Payer: Medicaid Other | Admitting: Occupational Therapy

## 2016-09-08 ENCOUNTER — Inpatient Hospital Stay (HOSPITAL_COMMUNITY): Payer: Medicaid Other | Admitting: Speech Pathology

## 2016-09-08 LAB — GLUCOSE, CAPILLARY
GLUCOSE-CAPILLARY: 79 mg/dL (ref 65–99)
GLUCOSE-CAPILLARY: 114 mg/dL — AB (ref 65–99)
Glucose-Capillary: 97 mg/dL (ref 65–99)
Glucose-Capillary: 106 mg/dL — ABNORMAL HIGH (ref 65–99)
Glucose-Capillary: 100 mg/dL — ABNORMAL HIGH (ref 65–99)

## 2016-09-08 MED ORDER — FREE WATER
100.0000 mL | Freq: Two times a day (BID) | Status: DC
  Administered 2016-09-08 – 2016-09-14 (×12): 100 mL

## 2016-09-08 MED ORDER — PRO-STAT SUGAR FREE PO LIQD
30.0000 mL | Freq: Two times a day (BID) | ORAL | Status: DC
  Administered 2016-09-08 – 2016-09-15 (×15): 30 mL
  Filled 2016-09-08 (×15): qty 30

## 2016-09-08 NOTE — Progress Notes (Signed)
Occupational Therapy Session Note  Patient Details  Name: Elmon ElseDarren T Nevils MRN: [id number] Date of Birth: 05/31/1981  Today's Date: 09/08/2016 OT Individual Time: 0700-0758 OT Individual Time Calculation (min): 58 min    Short Term Goals: Week 3:  OT Short Term Goal 1 (Week 3): Pt will perform toilet transfers with supervision OT Short Term Goal 2 (Week 3): Pt will complete bathing tasks with supervision and min verbal cues for initiattion OT Short Term Goal 3 (Week 3): Pt will complete LB dressing tasks with supervision and min verbal cues for initiation OT Short Term Goal 4 (Week 3): Pt will perform grooming tasks standing at sink with supervision and min verbal cues for initiation  Skilled Therapeutic Interventions/Progress Updates:    Pt engaged in BADL retraining including bathing at shower level and dressing with sit<>stand from chair.  Pt follows all commands but requires max verbal cues to initiate tasks.  Pt continues to perseverate on bathing tasks and requires min multimodal cues to transition to next segment of task.  Pt completes dressing tasks when presented with clothing.  Pt requires min verbal cues to initiate fastening shoes.  Pt stood at sink to complete grooming tasks.  Focus on standing balance, task initiation, sequencing, and safety awareness to increase independence with BADLs.  Therapy Documentation Precautions:  Precautions Precautions: Fall, Cervical Precaution Comments:   Restrictions Weight Bearing Restrictions: No Pain:  Pt with no s/s of pain  See Function Navigator for Current Functional Status.   Therapy/Group: Individual Therapy  Rich BraveLanier, Koby Pickup Chappell 09/08/2016, 8:02 AM

## 2016-09-08 NOTE — Progress Notes (Signed)
Occupational Therapy Session Note  Patient Details  Name: Elmon ElseDarren T Criswell MRN: [id number] Date of Birth: [birth year]  Today's Date: 09/08/2016 OT Individual Time:9:45-10:15 30 min   Short Term Goals: Week 3:  OT Short Term Goal 1 (Week 3): Pt will perform toilet transfers with supervision OT Short Term Goal 2 (Week 3): Pt will complete bathing tasks with supervision and min verbal cues for initiattion OT Short Term Goal 3 (Week 3): Pt will complete LB dressing tasks with supervision and min verbal cues for initiation OT Short Term Goal 4 (Week 3): Pt will perform grooming tasks standing at sink with supervision and min verbal cues for initiation  Skilled Therapeutic Interventions/Progress Updates:    Pt seen seated in w/c at nurses station. Focus of session on pathfinding and orientation to self, family, situation. Pt nods head about needing to use bathroom. With MAX VC to scan hallways, pt ambulates with CGA and finds room after passing it 3 times. With supervision pt uses grab bar to transfer on/off of toilet. Pt unable to have bowel movement. Pt returns to day room and writes daughters name when prompted to write his own. Pt able to state he has 4 children, and incorrectly writes that he was born in [birth year]. Pt corrected but perseverates on writing daughters name. Pt returned to w/c at nurses station with chair alarm on.   Therapy Documentation Precautions:  Precautions Precautions: Fall, Cervical Precaution Comments:   Restrictions Weight Bearing Restrictions: No  ADL ADL Comments: see Functional Assessment Tool  See Function Navigator for Current Functional Status.   Therapy/Group: Individual Therapy  Shon HaleStephanie M Luigi Stuckey 09/08/2016, 11:11 AM

## 2016-09-08 NOTE — Progress Notes (Signed)
Occupational Therapy Note  Patient Details  Name: [name] MRN: [id number] Date of Birth: 1981/05/18  Today's Date: 09/08/2016 OT Individual Time: 1130-1200 OT Individual Time Calculation (min): 30 min   Pt with no s/s of pain Individual therapy  Pt engaged in functional amb and dynamic standing activities with focus on balance, following commands, and path finding.  Had pt look at room number and engage in functional amb with basketball (dribbling and passing) in unit and requested to return to room.  Pt unable to locate room.  Pt walks aimlessly and requires max multimodal cues to look to his right and/or make right hand turns.  Pt follows all commands without resistance and is easily redirected.  Pt returned to w/c with QRB in place at nursing station.    [name], [name] 09/08/2016, 3:03 PM

## 2016-09-08 NOTE — Progress Notes (Signed)
Occupational Therapy Session Note  Patient Details  Name: Elmon ElseDarren T Vanmaanen MRN: [id number] Date of Birth: 22-Dec-1980  Today's Date: 09/08/2016 OT Individual Time: 1300-1345 OT Individual Time Calculation (min): 45 min    Short Term Goals: Week 3:  OT Short Term Goal 1 (Week 3): Pt will perform toilet transfers with supervision OT Short Term Goal 2 (Week 3): Pt will complete bathing tasks with supervision and min verbal cues for initiattion OT Short Term Goal 3 (Week 3): Pt will complete LB dressing tasks with supervision and min verbal cues for initiation OT Short Term Goal 4 (Week 3): Pt will perform grooming tasks standing at sink with supervision and min verbal cues for initiation  Skilled Therapeutic Interventions/Progress Updates:    Treatment session with focus on following one step directions during sorting task. Utilized coins and dollar bills with focus on sorting between quarters and pennies, pt would pick up coins but was unable to sort into piles despite cues and providing pt with only one coin at a time to increase success.  Modified task to $5 and $20 dollar bills with pt unable to sort or match into piles of two.  Pt able to follow directions with cleaning up coins and dollar bills.  Utilized checkers as pt Retail bankerretrieving checker box with initial sorting of red and black piles but after 2-3, would put wrong color in pile with inability to correct.  Attempted to toilet transfer as pt fidgeting in w/c, completed transfer with min guard but no void.  Returned to w/c and left at [employer] station with quick release belt donned.  Therapy Documentation Precautions:  Precautions Precautions: Fall, Cervical Precaution Comments:   Restrictions Weight Bearing Restrictions: No Pain:  Pt with no c/o pain  See Function Navigator for Current Functional Status.   Therapy/Group: Individual Therapy  Rosalio LoudHOXIE, Christle Nolting 09/08/2016, 3:15 PM

## 2016-09-08 NOTE — Progress Notes (Signed)
Speech Language Pathology Weekly Progress and Session Note  Patient Details  Name: [name] MRN: [id number] Date of Birth: 01-31-1981  Beginning of progress report period: September 01, 2016 End of progress report period: September 08, 2016  Today's Date: 09/08/2016 SLP Individual Time: 0815-0900 SLP Individual Time Calculation (min): 45 min  Short Term Goals: Week 3: SLP Short Term Goal 1 (Week 3): Patient will verbalize at the word level in 25% of opportunities with Max A multimodal cues.  SLP Short Term Goal 1 - Progress (Week 3): Not met SLP Short Term Goal 2 (Week 3): Patient will demonstrate sustained attention to task for ~2 minutes with Max A multimodal cues.  SLP Short Term Goal 2 - Progress (Week 3): Met SLP Short Term Goal 3 (Week 3): Patient will initiate functional tasks with Max A verbal cues in 75% of opportunities  SLP Short Term Goal 3 - Progress (Week 3): Met SLP Short Term Goal 4 (Week 3): Patient will consume trials of ice chips with Min assist multimodal cues for initiation of swallow sequence without overt s/s of aspiration prior to diet initiation  SLP Short Term Goal 4 - Progress (Week 3): Met    New Short Term Goals: Week 4: SLP Short Term Goal 1 (Week 4): Patient will verbalize at the word level in 25% of opportunities with Max A multimodal cues.  SLP Short Term Goal 2 (Week 4): Patient will demonstrate sustained attention to task for ~5 minutes with Max A multimodal cues.  SLP Short Term Goal 3 (Week 4): Patient will demonstrate functional problem solving with Mod A verbal cues for basic and familiar tasks.  SLP Short Term Goal 4 (Week 4): Patient will consume current diet without overt s/s of aspiration with Min A verbal cues for use of swallowing compensatory strategies.  SLP Short Term Goal 5 (Week 4): Patient will demonstrate efficient mastication and complete oral clearance with trials of Dys. 2 textures with Min A verbal cues over 2 sessions prior to upgrade.    Weekly Progress Updates: Patient has made excellent gains and has met 3 of 4 STG's this reporting period. Currently, patient is consuming Dys. 1 textures with thin liquids without overt s/s of aspiration and requires Mod A verbal cues for use of swallowing compensatory strategies. 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 Patient demonstrates continued restlessness characterized by constant standing and aimlessly ambulating around unit. Patient is also nonverbal but demonstrates ability to follow commands consistently. Suspect patient is nonverbal due to cognitive impairments rather than a language impairment. Patient and family education is ongoing. Patient would benefit from skilled SLP intervention to maximize his swallowing and cognitive function as well as his functional communication in order to maximize his overall functional independence prior to discharge.      Intensity: Minumum of 1-2 x/day, 30 to 90 minutes Frequency: 3 to 5 out of 7 days Duration/Length of Stay: 09/19/16 Treatment/Interventions: English as a second language teacher;Dysphagia/aspiration precaution training;Environmental controls;Functional tasks;Patient/family education;Cognitive remediation/compensation;Internal/external aids;Speech/Language facilitation;Therapeutic Activities   Daily Session  Skilled Therapeutic Interventions: Skilled treatment session focused on dysphagia and cognitive goals. SLP facilitated session by providing Mod A verbal cues for portion control with breakfast meal of Dys. 1 textures with thin liquids. Patient consumed meal without overt s/s of aspiration. Patient with intermittent restlessness and would stand and ambulate aimlessly during structured tasks. Due to restlessness, patient placed on commode and  was continent of urine and required Mod-Max A verbal cues for problem  solving and sustained attention for ~2 minutes to self-care tasks. Patient sorted money with 100% accuracy but required total A for money management. Patient also wrote down name of 1 child but was then perseverative on nonsense words. Patient left upright in wheelchair with all needs within reach. Continue with current plan of care.       Function:   Eating Eating   Modified Consistency Diet: Yes Eating Assist Level: Supervision or verbal cues;More than reasonable amount of time;Set up assist for   Eating Set Up Assist For: Opening containers       Cognition Comprehension Comprehension assist level: Understands basic 50 - 74% of the time/ requires cueing 25 - 49% of the time  Expression   Expression assist level: Expresses basis less than 25% of the time/requires cueing >75% of the time.  Social Interaction Social Interaction assist level: Interacts appropriately 25 - 49% of time - Needs frequent redirection.  Problem Solving Problem solving assist level: Solves basic less than 25% of the time - needs direction nearly all the time or does not effectively solve problems and may need a restraint for safety  Memory Memory assist level: Recognizes or recalls less than 25% of the time/requires cueing greater than 75% of the time   Pain No indications of pain   Therapy/Group: Individual Therapy  Bacilio Abascal 09/08/2016, 3:07 PM
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
## 2016-09-08 NOTE — Progress Notes (Signed)
Physical Therapy Session Note  Patient Details  Name: [name] MRN: [id number] Date of Birth: [birth year]  Today's Date: 09/08/2016 PT Individual Time: 1550-1615 PT Individual Time Calculation (min): 25 min   Short Term Goals: Week 2:  PT Short Term Goal 1 (Week 2): Pt will consistently perform transfers with +1 assist PT Short Term Goal 1 - Progress (Week 2): Met PT Short Term Goal 2 (Week 2): Pt will follow 1 step commands 50% of the time PT Short Term Goal 2 - Progress (Week 2): Progressing toward goal Week 3:  PT Short Term Goal 1 (Week 3): Pt will attend to functional task x 5 minutes with min cuing PT Short Term Goal 2 (Week 3): Pt will gait with min A 150' in controlled environment  Skilled Therapeutic Interventions/Progress Updates:   Pt received sitting in EOB and agreeable to PT. PT treatment focused on functional gait in simulated home environment, sustained attention, and visual scanning with emphasis on R. Gait through rehab unit including through day room with obstacles set up to force patient to improve visual scanning to the R to avoid obstacles. Nustep x 6 minutes total as instructed by PT with several breaks to walk around rehab gym and clean up horse shoes. Max cues for awareness of box in peripheral  Vision.   Patient returned too room and left sitting EOB with call bell in reach and all needs met.         Therapy Documentation Precautions:  Precautions Precautions: Fall, Cervical Precaution Comments:   Restrictions Weight Bearing Restrictions: No Vital Signs: Therapy Vitals Pulse Rate: 60 BP: 137/68   See Function Navigator for Current Functional Status.   Therapy/Group: Individual Therapy  Lorie Phenix 09/08/2016, 10:46 PM

## 2016-09-08 NOTE — Progress Notes (Signed)
Social Work Patient ID: [name], male   DOB: 1980/10/09, 36 y.o.   MRN: [id number]   Have left message for mother as have not been able to meet in person with her this week. Informed of targeted d/c date of 3/20 and supervision goals.  Stressed that team continues to address initiation and communication/ cognition.  Will try and connect with her at beginning of the week.  Clay Solum, LCSW

## 2016-09-09 ENCOUNTER — Inpatient Hospital Stay (HOSPITAL_COMMUNITY): Payer: Medicaid Other | Admitting: Speech Pathology

## 2016-09-09 LAB — GLUCOSE, CAPILLARY
GLUCOSE-CAPILLARY: 124 mg/dL — AB (ref 65–99)
Glucose-Capillary: 125 mg/dL — ABNORMAL HIGH (ref 65–99)
Glucose-Capillary: 91 mg/dL (ref 65–99)
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
## 2016-09-09 NOTE — Progress Notes (Signed)
Patient HR 50 Lopressor 100 mg held and on-call was notified.

## 2016-09-09 NOTE — Progress Notes (Signed)
Speech Language Pathology Daily Session Note  Patient Details  Name: Elmon ElseDarren T Bothun MRN: [id number] Date of Birth: 02/22/1981  Today's Date: 09/09/2016 SLP Individual Time: 1015-1045 SLP Individual Time Calculation (min): 30 min  Short Term Goals: Week 4: SLP Short Term Goal 1 (Week 4): Patient will verbalize at the word level in 25% of opportunities with Max A multimodal cues.  SLP Short Term Goal 2 (Week 4): Patient will demonstrate sustained attention to task for ~5 minutes with Max A multimodal cues.  SLP Short Term Goal 3 (Week 4): Patient will demonstrate functional problem solving with Mod A verbal cues for basic and familiar tasks.  SLP Short Term Goal 4 (Week 4): Patient will consume current diet without overt s/s of aspiration with Min A verbal cues for use of swallowing compensatory strategies.  SLP Short Term Goal 5 (Week 4): Patient will demonstrate efficient mastication and complete oral clearance with trials of Dys. 2 textures with Min A verbal cues over 2 sessions prior to upgrade.   Skilled Therapeutic Interventions: Skilled treatment session focused on dysphagia and cognition goals. SLP facilitated session by providing skilled observation of py consuming trials of dysphagia 2/3 textures with thin liquids. Pt consumed graham crackers and graham crackers with peanut butter with complete oral clearing and good bolus manipulation. Pt required Max multiple modal cues with only 1 piece presented at a time or pt would place all pieces in mouth at one time. No overt s/s of aspiration observed with thin liquids via cup. Although pt was functional with his ability to consuming entire graham cracker sheet, it isn't recommended. Pt sustained attention to task for ~3 minutes with Mod A verbal cues. Pt able to demonstrate how to open graham cracker package, juice container and how to apply peanut butter to cracker. Pt's fiance was present during session. Pt left in her care. Continue per current  plan of care.      Function:  Eating Eating   Modified Consistency Diet: Yes Eating Assist Level: Supervision or verbal cues;More than reasonable amount of time;Set up assist for   Eating Set Up Assist For: Opening containers Helper Scoops Food on Utensil: Occasionally Helper Brings Food to Mouth: Occasionally   Cognition Comprehension Comprehension assist level: Understands basic 50 - 74% of the time/ requires cueing 25 - 49% of the time  Expression   Expression assist level: Expresses basis less than 25% of the time/requires cueing >75% of the time.  Social Interaction Social Interaction assist level: Interacts appropriately 25 - 49% of time - Needs frequent redirection.  Problem Solving Problem solving assist level: Solves basic less than 25% of the time - needs direction nearly all the time or does not effectively solve problems and may need a restraint for safety  Memory Memory assist level: Recognizes or recalls less than 25% of the time/requires cueing greater than 75% of the time    Pain    Therapy/Group: Individual Therapy   Kamen Hanken B. Dreama Saaverton, M.S., CCC-SLP Speech-Language Pathologist   Tamara Monteith 09/09/2016, 10:35 AM

## 2016-09-10 ENCOUNTER — Inpatient Hospital Stay (HOSPITAL_COMMUNITY): Payer: Medicaid Other | Admitting: Physical Therapy

## 2016-09-10 LAB — GLUCOSE, CAPILLARY: GLUCOSE-CAPILLARY: 117 mg/dL — AB (ref 65–99)

## 2016-09-10 MED ORDER — METOPROLOL TARTRATE 25 MG/10 ML ORAL SUSPENSION
100.0000 mg | Freq: Two times a day (BID) | ORAL | Status: DC
  Administered 2016-09-10 – 2016-09-11 (×2): 100 mg
  Filled 2016-09-10 (×2): qty 40

## 2016-09-10 NOTE — Progress Notes (Addendum)
Physical Therapy Weekly Progress Note  Patient Details  Name: [name] MRN: [id number] Date of Birth: Aug 22, 1980  Beginning of progress report period: August 31, 2016 End of progress report period: September 10, 2016  Today's Date: 09/10/2016  Patient has met 1 of 2 short term goals.  Pt is making good progress towards physical goals, as pt is able to ambulate without assistance. Pt demonstrates improving initiation of functional tasks but continues to require max cuing to attend and terminate tasks. Pt would benefit from continued skilled PT treatment to focus on cognitive remediation, balance training, and for caregiver training prior to pt's d/c.  Patient continues to demonstrate the following deficits general muscle weakness, decreased coordination, decreased visual perceptual skills, decreased attention to right, decreased initiation, decreased attention, decreased awareness, decreased problem solving, decreased safety awareness, decreased memory and delayed processing, and decreased standing balance, decreased postural control and decreased balance strategies and therefore will continue to benefit from skilled PT intervention to increase functional independence with mobility.  Patient progressing toward long term goals..  Long term goals have been updated 2/2 pt's progress and w/c goals have been discontinued as pt will d/c at an ambulatory level. Please see POC.  PT Short Term Goals Week 3:  PT Short Term Goal 1 (Week 3): Pt will attend to functional task x 5 minutes with min cuing PT Short Term Goal 1 - Progress (Week 3): Progressing toward goal PT Short Term Goal 2 (Week 3): Pt will gait with min A 150' in controlled environment PT Short Term Goal 2 - Progress (Week 3): Met Week 4:  PT Short Term Goal 1 (Week 4): STG = LTG due to estimated d/c date.   Therapy Documentation Precautions:  Precautions Precautions: Fall, PEG tube Precaution Comments:   Restrictions Weight Bearing  Restrictions: No   See Function Navigator for Current Functional Status.   Waunita Schooner 09/10/2016, 12:44 PM

## 2016-09-10 NOTE — Plan of Care (Signed)
Problem: RH Balance Goal: LTG Patient will maintain dynamic sitting balance (PT) LTG:  Patient will maintain dynamic sitting balance with assistance during mobility activities (PT)  Upgrade 2/2 progress Goal: LTG Patient will maintain dynamic standing balance (PT) LTG:  Patient will maintain dynamic standing balance with assistance during mobility activities (PT)  Upgrade 2/2 progress  Problem: RH Bed Mobility Goal: LTG Patient will perform bed mobility with assist (PT) LTG: Patient will perform bed mobility with assistance, with/without cues (PT).  Upgrade 2/2 progress  Problem: RH Bed to Chair Transfers Goal: LTG Patient will perform bed/chair transfers w/assist (PT) LTG: Patient will perform bed/chair transfers with assistance, with/without cues (PT).  Upgrade 2/2 progress  Problem: RH Car Transfers Goal: LTG Patient will perform car transfers with assist (PT) LTG: Patient will perform car transfers with assistance (PT).  Upgrade 2/2 progress  Problem: RH Ambulation Goal: LTG Patient will ambulate in controlled environment (PT) LTG: Patient will ambulate in a controlled environment, # of feet with assistance (PT).  150 ft with LRAD; upgrade 2/2 progress Goal: LTG Patient will ambulate in home environment (PT) LTG: Patient will ambulate in home environment, # of feet with assistance (PT).  50 ft with LRAD; upgrade 2/2 progress  Problem: RH Wheelchair Mobility Goal: LTG Patient will propel w/c in controlled environment (PT) LTG: Patient will propel wheelchair in controlled environment, # of feet with assist (PT)  Outcome: Adequate for Discharge Pt to d/c at ambulatory level Goal: LTG Patient will propel w/c in home environment (PT) LTG: Patient will propel wheelchair in home environment, # of feet with assistance (PT).  Outcome: Adequate for Discharge Pt to d/c at ambulatory level   Problem: RH Stairs Goal: LTG Patient will ambulate up and down stairs w/assist (PT) LTG:  Patient will ambulate up and down # of stairs with assistance (PT)  upgrade 2/2 progress

## 2016-09-10 NOTE — Progress Notes (Addendum)
Physical Therapy Session Note  Patient Details  Name: [name] MRN: [id number] Date of Birth: [birth year]  Today's Date: 09/10/2016 PT Individual Time: 1006-1101 PT Individual Time Calculation (min): 55 min   Short Term Goals: Week 3:  PT Short Term Goal 1 (Week 3): Pt will attend to functional task x 5 minutes with min cuing PT Short Term Goal 2 (Week 3): Pt will gait with min A 150' in controlled environment  Skilled Therapeutic Interventions/Progress Updates:  Pt received ambulating throughout room. Pt required total assist to retreive pants and max cuing to don pants and shoes. Session focused on cognitive remediation (attention to task, terminating tasks). Pt ambulated throughout unit without AD & supervision requiring max cuing for path finding, otherwise pt will self select to enter/exit various rooms on unit. Pt engaged in token sorting task; pt required max cuing to correctly sort red and yellow tokens with max cuing to recognize errors and to correct them. Pt will perseverate on moving tokens on table and requires max attention to terminate task and focus on sorting.  Pt watered plants in dayroom with max cuing to retrieve watering can, max cuing to attend to filling can up with water, and max cuing to water plants. Pt with very short attention to task and requires max cuing for redirection. Attempted to engage pt in zoom ball while sitting and pt only able to participate in task for 5-10 seconds at a time 2/2 poor attention to task. Returned to sorting yellow/red tokens and pt with minimal improvement in sorting tokens from choice of 2/4/many. At end of session pt left in room with sheet over door.   Addendum: Throughout session therapist passively oriented pt to time. Pt continues to remain nonverbal during session.    Therapy Documentation Precautions:  Precautions Precautions: Fall, PEG tube Precaution Comments:   Restrictions Weight Bearing Restrictions: No    Pain: Faces - no pain.   See Function Navigator for Current Functional Status.   Therapy/Group: Individual Therapy  Sandi MariscalVictoria M Stokes Page 09/10/2016, 11:43 AM
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
## 2016-09-11 ENCOUNTER — Inpatient Hospital Stay (HOSPITAL_COMMUNITY): Payer: Self-pay

## 2016-09-11 ENCOUNTER — Inpatient Hospital Stay (HOSPITAL_COMMUNITY): Payer: Self-pay | Admitting: Speech Pathology

## 2016-09-11 ENCOUNTER — Inpatient Hospital Stay (HOSPITAL_COMMUNITY): Payer: Medicaid Other | Admitting: Occupational Therapy

## 2016-09-11 ENCOUNTER — Inpatient Hospital Stay (HOSPITAL_COMMUNITY): Payer: Medicaid Other | Admitting: Speech Pathology

## 2016-09-11 ENCOUNTER — Inpatient Hospital Stay (HOSPITAL_COMMUNITY): Payer: Medicaid Other | Admitting: Physical Therapy

## 2016-09-11 LAB — BASIC METABOLIC PANEL
Anion gap: 6 (ref 5–15)
BUN: 6 mg/dL (ref 6–20)
CHLORIDE: 103 mmol/L (ref 101–111)
CO2: 29 mmol/L (ref 22–32)
CREATININE: 0.8 mg/dL (ref 0.61–1.24)
Calcium: 9.1 mg/dL (ref 8.9–10.3)
GFR calc non Af Amer: >60 mL/min (ref >60)
Glucose, Bld: 99 mg/dL (ref 65–99)
Potassium: 4 mmol/L (ref 3.5–5.1)
SODIUM: 138 mmol/L (ref 135–145)

## 2016-09-11 LAB — CBC
HCT: 36.4 % — ABNORMAL LOW (ref 39.0–52.0)
Hemoglobin: 11.7 g/dL — ABNORMAL LOW (ref 13.0–17.0)
MCH: 30.4 pg (ref 26.0–34.0)
MCHC: 32.1 g/dL (ref 30.0–36.0)
MCV: 94.5 fL (ref 78.0–100.0)
Platelets: 240 10*3/uL (ref 150–400)
RBC: 3.85 MIL/uL — AB (ref 4.22–5.81)
RDW: 14.9 % (ref 11.5–15.5)
WBC: 5.6 10*3/uL (ref 4.0–10.5)

## 2016-09-11 MED ORDER — ACETAMINOPHEN 325 MG PO TABS
650.0000 mg | ORAL_TABLET | Freq: Four times a day (QID) | ORAL | Status: DC | PRN
  Administered 2016-09-13 – 2016-09-17 (×5): 650 mg via ORAL
  Filled 2016-09-11 (×4): qty 2

## 2016-09-11 MED ORDER — METOPROLOL TARTRATE 50 MG PO TABS
100.0000 mg | ORAL_TABLET | Freq: Two times a day (BID) | ORAL | Status: DC
  Administered 2016-09-11 – 2016-09-19 (×16): 100 mg via ORAL
  Filled 2016-09-11 (×16): qty 2

## 2016-09-11 MED ORDER — LEVETIRACETAM 250 MG PO TABS
250.0000 mg | ORAL_TABLET | Freq: Two times a day (BID) | ORAL | Status: DC
  Administered 2016-09-11 – 2016-09-13 (×4): 250 mg via ORAL
  Filled 2016-09-11 (×6): qty 1

## 2016-09-11 MED ORDER — METHYLPHENIDATE HCL 5 MG PO TABS
15.0000 mg | ORAL_TABLET | Freq: Two times a day (BID) | ORAL | Status: DC
  Administered 2016-09-11 – 2016-09-19 (×17): 15 mg via ORAL
  Filled 2016-09-11 (×17): qty 3

## 2016-09-11 MED ORDER — CLONIDINE HCL 0.1 MG PO TABS: 0.1000 mg | ORAL_TABLET | Freq: Three times a day (TID) | ORAL | Status: DC | PRN

## 2016-09-11 MED ORDER — LEVETIRACETAM 250 MG PO TABS
125.0000 mg | ORAL_TABLET | Freq: Two times a day (BID) | ORAL | Status: DC
Start: 2016-09-11 — End: 2016-09-11

## 2016-09-11 NOTE — Progress Notes (Signed)
Occupational Therapy Session Note  Patient Details  Name: [name] MRN: [id number] Date of Birth: [birth year]  Today's Date: 09/11/2016 OT Individual Time: 1300-1330 OT Individual Time Calculation (min): 30 min    Short Term Goals: Week 3:  OT Short Term Goal 1 (Week 3): Pt will perform toilet transfers with supervision OT Short Term Goal 2 (Week 3): Pt will complete bathing tasks with supervision and min verbal cues for initiattion OT Short Term Goal 3 (Week 3): Pt will complete LB dressing tasks with supervision and min verbal cues for initiation OT Short Term Goal 4 (Week 3): Pt will perform grooming tasks standing at sink with supervision and min verbal cues for initiation  Skilled Therapeutic Interventions/Progress Updates:    Pt seen for OT session focusing on attention to task and problem solving. Pt sitting EOB upon arrival with friend present. He required increased time and cuing for engagement into tx session. He remained non-verbal throughout tx session despite initiation into yes/no questions from therapist. He ambulated throughout unit with supervision, required mod-max cuing for path finding to gym and multimodal cuing to redirect to proper path following missed direction. In low stimulus environment (BI gym), pt engaged in basic jigsaw puzzle. Pt able to to attend to task ~20 minutes, initially requiring mod cuing progressing to supervision. He displayed significant difficulties with problem solving correct placement of puzzle pieces, unable to self correct and unable to follow basic cuing from therapist for problem solving placement of pieces. Pt returned to room in same manner as described above. Left in room with friend present, sheet placed over doorway.   Therapy Documentation Precautions:  Precautions Precautions: Fall, Cervical Precaution Comments:   Restrictions Weight Bearing Restrictions: No ADL: ADL ADL Comments: see Functional Assessment Tool  See  Function Navigator for Current Functional Status.   Therapy/Group: Individual Therapy  Lewis, Averi Kilty C 09/11/2016, 12:43 PM

## 2016-09-11 NOTE — Progress Notes (Signed)
Occupational Therapy Session Note  Patient Details  Name: Elmon ElseDarren T Eversley MRN: [id number] Date of Birth: 12/07/1980  Today's Date: 09/11/2016 OT Individual Time: 1530-1600 OT Individual Time Calculation (min): 30 min    Short Term Goals: Week 3:  OT Short Term Goal 1 (Week 3): Pt will perform toilet transfers with supervision OT Short Term Goal 2 (Week 3): Pt will complete bathing tasks with supervision and min verbal cues for initiattion OT Short Term Goal 3 (Week 3): Pt will complete LB dressing tasks with supervision and min verbal cues for initiation OT Short Term Goal 4 (Week 3): Pt will perform grooming tasks standing at sink with supervision and min verbal cues for initiation  Skilled Therapeutic Interventions/Progress Updates:    Upon entering the room, pt supine in bed with mother and fiance present in the room. Caregivers with several questions regarding OT purpose, pt's progress, and general TBI edu. OT answered their questions until satisfied and no further questions at this time. Pt smiling and hugging and kissing mother good bye. Pt also hugged therapist this session. Pt ambulating to ADL apartment with supervision and engaged in putting dishes into cabinet. OT provided cues by handing plate to pt to start task but he then completed task with increased time and sorted items in cabinet. Pt taking seated rest break and then engaged in making the bed in apartment. Pt needing min multimodal cues to initiate and sequence task. Pt then asked if he could locate room. Pt unable to locate without mod assistance. Pt returned to bed at end of session and bed alarm activated and call bell within reach. Pt giving therapist high five .   Therapy Documentation Precautions:  Precautions Precautions: Fall, Cervical Precaution Comments:   Restrictions Weight Bearing Restrictions: No General:   Vital Signs: Therapy Vitals Temp: 97.8 F (36.6 C) Temp Source: Oral Pulse Rate: (!) 52 Resp:  17 BP: (!) 161/90 Patient Position (if appropriate): Lying Oxygen Therapy SpO2: 100 % O2 Device: Not Delivered Pain: Pain Assessment Pain Assessment: No/denies pain ADL: ADL ADL Comments: see Functional Assessment Tool Exercises:   Other Treatments:    See Function Navigator for Current Functional Status.   Therapy/Group: Individual Therapy  [name], Laszlo Ellerby P 09/11/2016, 4:25 PM

## 2016-09-11 NOTE — Progress Notes (Addendum)
Physical Therapy Session Note  Patient Details  Name: [name] MRN: [id number] Date of Birth: [birth year]  Today's Date: 09/11/2016 PT Individual Time: 1107-1202 PT Individual Time Calculation (min): 55 min   Short Term Goals: Week 4:  PT Short Term Goal 1 (Week 4): STG = LTG due to estimated d/c date.  Skilled Therapeutic Interventions/Progress Updates:  Pt received sitting on EOB. Pt required max cuing to don pants and shoes as pt perseverated on pushing buttons on call bell. Pt ambulated throughout unit, over even & uneven surfaces & ramp with supervision. Pt negotiated curb with supervision and completed car transfer (low, sedan simulated height) with supervision. Pt sorted yellow & red tokens with 70% accuracy. Pt then consumed lunch with max cuing & occasionally assistance to consume smaller bites and to swallow before taking another bite. Pt continues to remain nonverbal throughout session and will attempt to wander around unit unless given multimodal cuing. At end of session pt left in bed with all needs within reach.   Addendum: Pt ambulated backwards with steady assist<>supervision with max cuing for task, with activity focusing on high level balance training.   Therapy Documentation Precautions:  Precautions Precautions: Fall, Cervical Precaution Comments:   Restrictions Weight Bearing Restrictions: No  Pain: Faces - no pain.  See Function Navigator for Current Functional Status.   Therapy/Group: Individual Therapy  Sandi MariscalVictoria M Kyoko Elsea 09/11/2016, 1:55 PM

## 2016-09-11 NOTE — Progress Notes (Signed)
Speech Language Pathology Daily Session Note  Patient Details  Name: [name] MRN: [id number] Date of Birth: 04-Dec-1980  Today's Date: 09/11/2016 SLP Individual Time: 1400-1500 SLP Individual Time Calculation (min): 60 min  Short Term Goals: Week 4: SLP Short Term Goal 1 (Week 4): Patient will verbalize at the word level in 25% of opportunities with Max A multimodal cues.  SLP Short Term Goal 2 (Week 4): Patient will demonstrate sustained attention to task for ~5 minutes with Max A multimodal cues.  SLP Short Term Goal 3 (Week 4): Patient will demonstrate functional problem solving with Mod A verbal cues for basic and familiar tasks.  SLP Short Term Goal 4 (Week 4): Patient will consume current diet without overt s/s of aspiration with Min A verbal cues for use of swallowing compensatory strategies.  SLP Short Term Goal 5 (Week 4): Patient will demonstrate efficient mastication and complete oral clearance with trials of Dys. 2 textures with Min A verbal cues over 2 sessions prior to upgrade.   Skilled Therapeutic Interventions: Skilled treatment session focused on dysphagia and cognition goals. SLP facilitated session by providing Min A faded to supervision for bolus size with graham crackers and peanut butter. Pt tolerated without overt s/s of aspiration. Pt required Max A multimodal assist for completion of any simple cognition task presented. Pt was returned to room, left in bed, girlfriend present. All needs within reach. Continue per current plan of care.      Function:  Eating Eating   Modified Consistency Diet: Yes Eating Assist Level: Supervision or verbal cues;More than reasonable amount of time;Set up assist for   Eating Set Up Assist For: Opening containers       Cognition Comprehension Comprehension assist level: Understands basic 50 - 74% of the time/ requires cueing 25 - 49% of the time  Expression   Expression assist level: Expresses basis less than 25% of the  time/requires cueing >75% of the time.  Social Interaction Social Interaction assist level: Interacts appropriately 25 - 49% of time - Needs frequent redirection.  Problem Solving Problem solving assist level: Solves basic less than 25% of the time - needs direction nearly all the time or does not effectively solve problems and may need a restraint for safety  Memory Memory assist level: Recognizes or recalls less than 25% of the time/requires cueing greater than 75% of the time    Pain    Therapy/Group: Individual Therapy  Abagale Boulos B. Dreama Saaverton, M.S., CCC-SLP Speech-Language Pathologist  Kendell Gammon 09/11/2016, 2:46 PM

## 2016-09-11 NOTE — Progress Notes (Signed)
Social Work Patient ID: [name], male   DOB: 1980-10-06, 36 y.o.   MRN: [id number]   Met with pt's mother and fiance this afternoon and discussed current progress and concerns.  Mother reports that she is "worried about the agitation".  She acknowledges that he has not shown much so far and wonders if it will begin at home.  She and fiance feel that they are able to elicit some verbalization from pt and we talked about them beginning to participate in tx sessions as they are able.  We also discussed having pt's children come to participate and have a time to talk with them about TBI - fiance to let me know when they are able to come.  They are aware that we are aiming for d/c on 3/20 but are, admittedly, "nervous".  Will need time to talk through behavioral management and general TBI ed prior to next week.  Justin Moga, LCSW

## 2016-09-11 NOTE — Progress Notes (Signed)
Speech Language Pathology Daily Session Note  Patient Details  Name: [name] MRN: [id number] Date of Birth: 1980/07/20  Today's Date: 09/11/2016 SLP Individual Time: 2952-84130830-0925 SLP Individual Time Calculation (min): 55 min  Short Term Goals: Week 4: SLP Short Term Goal 1 (Week 4): Patient will verbalize at the word level in 25% of opportunities with Max A multimodal cues.  SLP Short Term Goal 2 (Week 4): Patient will demonstrate sustained attention to task for ~5 minutes with Max A multimodal cues.  SLP Short Term Goal 3 (Week 4): Patient will demonstrate functional problem solving with Mod A verbal cues for basic and familiar tasks.  SLP Short Term Goal 4 (Week 4): Patient will consume current diet without overt s/s of aspiration with Min A verbal cues for use of swallowing compensatory strategies.  SLP Short Term Goal 5 (Week 4): Patient will demonstrate efficient mastication and complete oral clearance with trials of Dys. 2 textures with Min A verbal cues over 2 sessions prior to upgrade.   Skilled Therapeutic Interventions: Skilled treatment session focused on dysphagia and cognitive goals. SLP facilitated session by providing trials of Dys. 3 textures. Patient demonstrated efficient but prolonged mastication. Patient did not open oral cavity in order to see oral residue, however, patient with mild-moderate food particles in saliva after oral care.  Recommend continued trials prior to upgrade. Patient appeared lethargic throughout sessin and required extra time and Mod-Max A verbal cues for initiation. Patient performed basic self-care tasks with Mod-Max A verbal and visual cues needed for problem solving and following procedures during a simple task. Patient indicated he had a headache. RN aware and administered medications. Patient left supine in bed with all needs within reach and fiance present. Continue with current plan of care.       Function:  Eating Eating   Modified  Consistency Diet: Yes Eating Assist Level: Supervision or verbal cues;More than reasonable amount of time;Set up assist for   Eating Set Up Assist For: Opening containers       Cognition Comprehension Comprehension assist level: Understands basic 50 - 74% of the time/ requires cueing 25 - 49% of the time  Expression   Expression assist level: Expresses basis less than 25% of the time/requires cueing >75% of the time.  Social Interaction Social Interaction assist level: Interacts appropriately 25 - 49% of time - Needs frequent redirection.  Problem Solving Problem solving assist level: Solves basic less than 25% of the time - needs direction nearly all the time or does not effectively solve problems and may need a restraint for safety  Memory Memory assist level: Recognizes or recalls less than 25% of the time/requires cueing greater than 75% of the time    Pain Headache, unable to rate. RN administered medications   Therapy/Group: Individual Therapy  Jessyka Austria 09/11/2016, 12:42 PM

## 2016-09-11 NOTE — Progress Notes (Signed)
Speech Language Pathology Daily Session Note  Patient Details  Name: Elmon ElseDarren T Lyne MRN: [id number] Date of Birth: 02/25/1981  Today's Date: 09/11/2016 SLP Individual Time: 1500-1530 SLP Individual Time Calculation (min): 30 min  Short Term Goals: Week 4: SLP Short Term Goal 1 (Week 4): Patient will verbalize at the word level in 25% of opportunities with Max A multimodal cues.  SLP Short Term Goal 2 (Week 4): Patient will demonstrate sustained attention to task for ~5 minutes with Max A multimodal cues.  SLP Short Term Goal 3 (Week 4): Patient will demonstrate functional problem solving with Mod A verbal cues for basic and familiar tasks.  SLP Short Term Goal 4 (Week 4): Patient will consume current diet without overt s/s of aspiration with Min A verbal cues for use of swallowing compensatory strategies.  SLP Short Term Goal 5 (Week 4): Patient will demonstrate efficient mastication and complete oral clearance with trials of Dys. 2 textures with Min A verbal cues over 2 sessions prior to upgrade.   Skilled Therapeutic Interventions: Skilled treatment session focused on addressing dysphagia and cognition goals. SLP facilitated session by providing set-up assist of regular textures and thin liquids via cup. SLP provided extra time, repetition, and visual cues to initiate sitting up edge of bed for PO trials, which were not effective; however, Min assist tactile cues were effective at initiating sitting up.  SLP also facilitated session with Min assist verbal cues for portion control and pacing to allow for effective oral clearance of PO. Patient likely ready for diet advancement; however, defer decision of timing to primary SLP.  Continue with current plan of care for now.    Function:  Eating Eating   Modified Consistency Diet: Yes Eating Assist Level: Supervision or verbal cues;More than reasonable amount of time;Set up assist for   Eating Set Up Assist For: Opening containers        Cognition Comprehension Comprehension assist level: Understands basic 50 - 74% of the time/ requires cueing 25 - 49% of the time  Expression   Expression assist level: Expresses basis less than 25% of the time/requires cueing >75% of the time.  Social Interaction Social Interaction assist level: Interacts appropriately 25 - 49% of time - Needs frequent redirection.  Problem Solving Problem solving assist level: Solves basic less than 25% of the time - needs direction nearly all the time or does not effectively solve problems and may need a restraint for safety  Memory Memory assist level: Recognizes or recalls less than 25% of the time/requires cueing greater than 75% of the time    Pain Pain Assessment Pain Assessment: No/denies pain  Therapy/Group: Individual Therapy  Charlane FerrettiMelissa Drexel Ivey, M.A., CCC-SLP [phone]  Anayansi Rundquist 09/11/2016, 4:22 PM
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
## 2016-09-12 ENCOUNTER — Inpatient Hospital Stay (HOSPITAL_COMMUNITY): Payer: Self-pay | Admitting: Physical Therapy

## 2016-09-12 ENCOUNTER — Inpatient Hospital Stay (HOSPITAL_COMMUNITY): Payer: Medicaid Other | Admitting: Physical Therapy

## 2016-09-12 ENCOUNTER — Inpatient Hospital Stay (HOSPITAL_COMMUNITY): Payer: Self-pay | Admitting: Occupational Therapy

## 2016-09-12 ENCOUNTER — Inpatient Hospital Stay (HOSPITAL_COMMUNITY): Payer: Medicaid Other | Admitting: Speech Pathology
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
## 2016-09-12 NOTE — Progress Notes (Signed)
Physical Therapy Session Note  Patient Details  Name: [name] MRN: [id number] Date of Birth: September 25, 1980  Today's Date: 09/12/2016 PT Individual Time: [id number] and [id number] and [id number] PT Individual Time Calculation (min): 55 min and 30 min and 27 min   Short Term Goals: Week 4:  PT Short Term Goal 1 (Week 4): STG = LTG due to estimated d/c date.  Skilled Therapeutic Interventions/Progress Updates:  Treatment 1: Pt received in room with fiance Justin Cooper(Mica) present. [name] reports pt will d/c home with him and she is aware he will need 24 hr care and reports she will need assistance from Dyshawn's mother & sister to provide this because she works at night & on weekends. [name] reports she is a Engineer, civil (consulting)nurse and feels prepared to care for Alvey at home, reporting she will place alarms on all doors and windows to house. Educated [name] that pt will wonder aimlessly throughout home; educated her on safety precautions as pt is unsafe to use stove and other similar items in home. Educated Mica on pt's impaired vision impairments and R inattention; [name] states she plans to take him to see an eye doctor after d/c from the hospital. Pt required max assist to retreive clothing and heavy cuing to initiate donning socks. Pt donned shirt over current shirt he is wearing, requiring max cuing to correct. Pt utilized weight machine in ortho gym to perform tricep extension and scapular retraction exercises. Pt able to select correct color cup from choice of 2 and 3, as well as stack cups of same color with supervision. Pt unable to stack cups with alternating colors despite demonstration prior. Pt with need to use restroom and required max assist to find path back to room. Pt with continent BM on toilet and performed peri hygiene with supervision and cuing for hygiene initiation. Pt completed hand hygiene standing at sink with supervision and cuing to locate soap & paper towels. At end of session pt left in room with all needs  within reach & sheet over door. Faces pain scale: no hurt.   Treatment 2: Pt received in bed. Faces pain scale: no hurt. Pt with angry demeanor on face and nodded head yes when asked if something was wrong. Provided pt with paper and pen in attempt to have pt write for communication; pt able to write single words but unable to form complete sentences. Pt also perseverative on single words and would write the same word repeatedly. Therapist unable to determine what was irritating patient. Pt engaged in folding towels with supervision. Pt frequently touching tape covering Peg tube & RN made aware. At end of session pt left in bed with all needs within reach & sheet over door.   Treatment 3: Pt without behaviors demonstrating pain during session. Session focused on functional mobility, endurance training, attention to task, initiation/terminiation of tasks, and BLE strengthening. Pt utilized cybex kinetron in standing and nu-step with BLE only on level 4 x 10 minutes. Pt requires supervision for attention to task. Pt engaged in zoom ball while sitting and standing with improved ability to perform task as compared to previous date. Pt negotiated obstacle course (stepping over poles & weaving between cones) with min directional cuing to weave between cones. Pt required max cuing & demonstration to initiate picking up cones after task, and required intermittent heavy cuing to terminate various tasks during session. At end of session pt left sitting on EOB with mother present to supervise & LCSW present.  Therapy Documentation Precautions:  Precautions Precautions: Fall, Cervical Precaution Comments:   Restrictions Weight Bearing Restrictions: No   See Function Navigator for Current Functional Status.   Therapy/Group: Individual Therapy  Sandi Mariscal 09/12/2016, 5:15 PM

## 2016-09-12 NOTE — Progress Notes (Signed)
Physical Therapy Note  Patient Details  Name: [name] MRN: [id number] Date of Birth: 15-Apr-1981 Today's Date: 09/12/2016    Time: 900-940 40 minutes  1:1 No signs/symptoms of pain. Pt able to consistently follow commands throughout session to participate in functional and therapeutic tasks. Basketball passing and dribbling for balance with mod I for balance tasks.  Pt able to follow cues to pass ball. Pt still does not shoot ball but places ball in basket and perseverates on that motion until cued to stop.  Attempted dynavision with touching red lights. Pt requires max/total cues to touch only red lights, perseverates on touching all buttons on the board. Attempted to have pt spell name with magnet letters, pt unable to perform task but is able to write his name on paper. Pt performs much better with functional tasks of don/doff shoes and playing basketball vs therapeutic tasks.   DONAWERTH,KAREN 09/12/2016, 9:42 AM

## 2016-09-12 NOTE — Progress Notes (Signed)
Speech Language Pathology Daily Session Note  Patient Details  Name: [name] MRN: [id number] Date of Birth: 1981/03/16  Today's Date: 09/12/2016 SLP Individual Time: 1015-1100 SLP Individual Time Calculation (min): 45 min  Short Term Goals: Week 4: SLP Short Term Goal 1 (Week 4): Patient will verbalize at the word level in 25% of opportunities with Max A multimodal cues.  SLP Short Term Goal 2 (Week 4): Patient will demonstrate sustained attention to task for ~5 minutes with Max A multimodal cues.  SLP Short Term Goal 3 (Week 4): Patient will demonstrate functional problem solving with Mod A verbal cues for basic and familiar tasks.  SLP Short Term Goal 4 (Week 4): Patient will consume current diet without overt s/s of aspiration with Min A verbal cues for use of swallowing compensatory strategies.  SLP Short Term Goal 5 (Week 4): Patient will demonstrate efficient mastication and complete oral clearance with trials of Dys. 2 textures with Min A verbal cues over 2 sessions prior to upgrade.   Skilled Therapeutic Interventions: Skilled treatment session focused on cognitive goals. SLP facilitated session by providing multiple opportunities for use of functional and automatic verbalizations (counting money, answering phone, etc). However, patient remained nonverbal throughout session. Patient utilized verbal expression at the word and phrase level to indicate he was oriented to place, situation and identified he had hurt his "head."  Patient's written expression were perseverative with spelling errors but SLP able to decode after multiple attempts by patient. Patient left upright sitting EOB with all needs within reach. Continue with current plan of care.      Function:   Cognition Comprehension Comprehension assist level: Understands basic 50 - 74% of the time/ requires cueing 25 - 49% of the time  Expression   Expression assist level: Expresses basis less than 25% of the time/requires  cueing >75% of the time.  Social Interaction Social Interaction assist level: Interacts appropriately 25 - 49% of time - Needs frequent redirection.  Problem Solving Problem solving assist level: Solves basic less than 25% of the time - needs direction nearly all the time or does not effectively solve problems and may need a restraint for safety  Memory Memory assist level: Recognizes or recalls less than 25% of the time/requires cueing greater than 75% of the time    Pain No/Denies Pain   Therapy/Group: Individual Therapy  [name] 09/12/2016, 3:10 PM

## 2016-09-12 NOTE — Progress Notes (Signed)
Patient was able to take all morning medications by mouth. Crushable medications were given with puree. Liquid medications were drank by patient and followed by puree.
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
## 2016-09-12 NOTE — Progress Notes (Signed)
Occupational Therapy Session Note  Patient Details  Name: [name] MRN: [id number] Date of Birth: Apr 24, 1981  Today's Date: 09/12/2016 OT Individual Time: 1130-1200 OT Individual Time Calculation (min): 30 min    Short Term Goals: Week 3:  OT Short Term Goal 1 (Week 3): Pt will perform toilet transfers with supervision OT Short Term Goal 2 (Week 3): Pt will complete bathing tasks with supervision and min verbal cues for initiattion OT Short Term Goal 3 (Week 3): Pt will complete LB dressing tasks with supervision and min verbal cues for initiation OT Short Term Goal 4 (Week 3): Pt will perform grooming tasks standing at sink with supervision and min verbal cues for initiation  Skilled Therapeutic Interventions/Progress Updates:  1:1 Engaged in self feeding in the dayroom with focus on decr impulsiveness and communicating his wants and needs. Pt required mod cues throughout meal to slow down and finish bolus before taking another bite. Pt able to communicate through writing his response down when given two choices. Pt miss spells words but therapist was able to understand.  Pt left in room with mod I environment setup with sheet and stop sign at door.  2nd session 13:00-13:30  1:1 When entered room pt answered yes to wanting to shower. Pt provided two cues: "go ahead and remove clothing," and environmental prompt of therapist turning on shower. Pt able to undress and enter shower without another cue. Pt provided with a washcloth and pt able to bathe with minimal cuing- progress from last week where he needed multiple cues to come familiar task. Pt remained nonverbal in session but did demonstrate a smile/ silent laughing in session. Without prompting pt dried off and retrieved underwear and donned. Pt then laid down in bed. (PTA pt was hot natured and wore underwear/ shorts at home without a shirt). Engaged pt to don pants and a shirt- pt able to retrieve and don with minimal A.  Twice in  session pt opened and  drained peg tube on the floor - RN made aware. MD reported in conference going to pull it soon.      Therapy Documentation Precautions:  Precautions Precautions: Fall, Cervical Precaution Comments:   Restrictions Weight Bearing Restrictions: No   Pain:  no c/o pain in either session  ADL: ADL ADL Comments: see Functional Assessment Tool  See Function Navigator for Current Functional Status.   Therapy/Group: Individual Therapy  Roney MansSmith, Alexey Rhoads [facility] 09/12/2016, 2:33 PM

## 2016-09-13 ENCOUNTER — Inpatient Hospital Stay (HOSPITAL_COMMUNITY): Payer: Medicaid Other | Admitting: Physical Therapy

## 2016-09-13 ENCOUNTER — Inpatient Hospital Stay (HOSPITAL_COMMUNITY): Payer: Self-pay | Admitting: Occupational Therapy

## 2016-09-13 ENCOUNTER — Inpatient Hospital Stay (HOSPITAL_COMMUNITY): Payer: Self-pay

## 2016-09-13 ENCOUNTER — Inpatient Hospital Stay (HOSPITAL_COMMUNITY): Payer: Medicaid Other | Admitting: Speech Pathology

## 2016-09-13 MED ORDER — LEVETIRACETAM 100 MG/ML PO SOLN
250.0000 mg | Freq: Two times a day (BID) | ORAL | Status: DC
  Administered 2016-09-13 – 2016-09-19 (×12): 250 mg via ORAL
  Filled 2016-09-13 (×12): qty 5
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
## 2016-09-13 NOTE — Progress Notes (Signed)
Speech Language Pathology Daily Session Note  Patient Details  Name: [name] MRN: [id number] Date of Birth: 11-Jun-1981  Today's Date: 09/13/2016 SLP Individual Time: 1330-1400 SLP Individual Time Calculation (min): 30 min  Short Term Goals: Week 4: SLP Short Term Goal 1 (Week 4): Patient will verbalize at the word level in 25% of opportunities with Max A multimodal cues.  SLP Short Term Goal 2 (Week 4): Patient will demonstrate sustained attention to task for ~5 minutes with Max A multimodal cues.  SLP Short Term Goal 3 (Week 4): Patient will demonstrate functional problem solving with Mod A verbal cues for basic and familiar tasks.  SLP Short Term Goal 4 (Week 4): Patient will consume current diet without overt s/s of aspiration with Min A verbal cues for use of swallowing compensatory strategies.  SLP Short Term Goal 5 (Week 4): Patient will demonstrate efficient mastication and complete oral clearance with trials of Dys. 2 textures with Min A verbal cues over 2 sessions prior to upgrade.   Skilled Therapeutic Interventions: Skilled treatment session focused on functional communication. SLP facilitated Max-Total A for patient to name functional items via written expression. Patient was 50% accurate but required total A to self-monitor and correct spelling errors at the word level due to severe perseveration of letters. Patient continues to be nonverbal despite Max A multimodal cues but appeared to demonstrate increased movement of his oral musculature. Patient required Max A verbal and visual cues for sustained attention to tasks for ~5 minutes. Patient left supine in bed with all needs within reach and fiance present. Continue with current plan of care.      Function:  Cognition Comprehension Comprehension assist level: Understands basic 50 - 74% of the time/ requires cueing 25 - 49% of the time  Expression   Expression assist level: Expresses basis less than 25% of the time/requires  cueing >75% of the time.  Social Interaction Social Interaction assist level: Interacts appropriately 25 - 49% of time - Needs frequent redirection.  Problem Solving Problem solving assist level: Solves basic 25 - 49% of the time - needs direction more than half the time to initiate, plan or complete simple activities  Memory Memory assist level: Recognizes or recalls less than 25% of the time/requires cueing greater than 75% of the time    Pain No indications of pain   Therapy/Group: Individual Therapy  Nailah Luepke 09/13/2016, 2:50 PM
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
## 2016-09-13 NOTE — Progress Notes (Signed)
Occupational Therapy Session Note  Patient Details  Name: [name] MRN: [id number] Date of Birth: February 12, 1981  Today's Date: 09/13/2016 OT Individual Time: 1000-1100 OT Individual Time Calculation (min): 60 min    Short Term Goals: Week 3:  OT Short Term Goal 1 (Week 3): Pt will perform toilet transfers with supervision OT Short Term Goal 2 (Week 3): Pt will complete bathing tasks with supervision and min verbal cues for initiattion OT Short Term Goal 3 (Week 3): Pt will complete LB dressing tasks with supervision and min verbal cues for initiation OT Short Term Goal 4 (Week 3): Pt will perform grooming tasks standing at sink with supervision and min verbal cues for initiation  Skilled Therapeutic Interventions/Progress Updates:    Pt resting in bed upon arrival and agreeable to bathing at shower level and dressing in room.  Pt amb into bathoom and used toilet prior to walking into shower.  Pt completed bathing tasks while standing in shower.  Pt continues to exhibit aimless motions/movements with frequent perseveration on task segments.  Pt requires min/mod verbal cues for sequencing to next segment of task/activity.  Pt walked back into room and completed dressing tasks while standing using sink as support to don pants while standing.  Pt completed oral hygiene while standing at sink.  Pt straightened out linens on bed.  Pt required more than a reasonable amount of time to complete all tasks 2/2 increased time walking aimlessly in room before initiating new task.  Pt remained in bed in room with all needs within reach.   Therapy Documentation Precautions:  Precautions Precautions: Fall, Cervical Precaution Comments:   Restrictions Weight Bearing Restrictions: No Pain:  Pt denied pain See Function Navigator for Current Functional Status.   Therapy/Group: Individual Therapy  Rich BraveLanier, Felisa Zechman Chappell 09/13/2016, 12:03 PM

## 2016-09-13 NOTE — Progress Notes (Signed)
Physical Therapy Note  Patient Details  Name: Justin ElseDarren T Page MRN: [id number] Date of Birth: [birth year] Today's Date: 09/13/2016  0900-1000, 60 min individual tx Pain: no S/S  Toilet transfer for continent voiding. Gait throughout unit on level tile with hand hold assist to increase velocity with good results.  neuromuscular re-education via forced use, positioning for prolonged stretch bil heel cords standing on wedge, x 30 sec x 2 with bil UE support . Kinetron at level 70 cm/sec in sitting x 20 cycloes, in standing x 3 cycles, 2 of those without use of UEs, with greater difficulty wt shifting to R noted.  Up-/down 12 6" steps bil rail with supervison.  Dual tasks with mod cues for choosing requested linens from cabinet in standing, sweeping kitchen floor in standing requiring moving chairs to get into corners, transporting linens across arm when returning to room.  Pt required seated rest breaks throughout session.  Active rest consisted of choosing correct fruit piece, out of 2, when asked questions such as: "which fruit do you have with shortcake and whipped cream? Pt accurate 4/5 trials.  For route finding to room, pt needed min cues initially.  Pt left resting in bed with  all needs within reach, "stop" sign across doorway.    Paizleigh Wilds 09/13/2016, 7:57 AM

## 2016-09-13 NOTE — Progress Notes (Signed)
Physical Therapy Session Note  Patient Details  Name: [name] MRN: [id number] Date of Birth: March 12, 1981  Today's Date: 09/13/2016 PT Individual Time: 0802-0832 PT Individual Time Calculation (min): 30 min   Short Term Goals: Week 4:  PT Short Term Goal 1 (Week 4): STG = LTG due to estimated d/c date.  Skilled Therapeutic Interventions/Progress Updates:  Pt received in bed but agreeable to tx. Pt ambulated throughout unit without AD & supervision. Pt engaged in dribbling basketball and bouncing it back/forth with therapist in moderately distracting hallway while ambulating. Pt engaged in sorting bean bags, attempting to select correct bean bag corresponding with appropriate meal. Pt unable to attend to task, as he would simply move both bean bags around on table. Pt begin lying head down on table and when asked if he didn't feel well pt able to exhibit behaviors demonstrating head pain. Pt self selected to return to room and get into bed. Vitals assessed, see below, and RN made aware of pt's complaints. Pt left in bed in Mod I room environment with sheet & stop sign over door. Pt missed 30 minutes of tx 2/2 HA/not feeling well.   Therapy Documentation Precautions:  Precautions Precautions: Fall, Cervical Precaution Comments:   Restrictions Weight Bearing Restrictions: No  General: PT Amount of Missed Time (min): 30 Minutes PT Missed Treatment Reason: Patient ill (Comment) (c/o HA)   Vital Signs: BP = 127/72 mmHg HR = 69 bpm   Pain: C/o HA - RN made aware.   See Function Navigator for Current Functional Status.   Therapy/Group: Individual Therapy  Sandi MariscalVictoria M Audryanna Zurita 09/13/2016, 8:43 AM

## 2016-09-13 NOTE — Progress Notes (Signed)
Physical Therapy Session Note  Patient Details  Name: [name] MRN: [id number] Date of Birth: 12-12-1980  Today's Date: 09/13/2016 PT Individual Time: 1530-1611 PT Individual Time Calculation (min): 41 min   Short Term Goals: Week 4:  PT Short Term Goal 1 (Week 4): STG = LTG due to estimated d/c date.  Skilled Therapeutic Interventions/Progress Updates:   Patient in bed upon arrival. Focus on following basic 1 and 2 step commands, sustained attention, and R attention. Patient ambulated on unit with supervision and slow gait speed. Engaged in [employer] in standing, 2 trials of 3 min each with max verbal/auditory cues to locate red lights in R lower quadrant and patient intermittently pressing random unlit buttons but able to locate red light on R with increased time. First trial with avg of 36 hits and second trial with avg of 41 hits but patient terminated task with 15 sec left, ambulated out of BI gym and back to room without cues for path finding for supine rest break while pulling shirt up over PEG tube. Patient easily redirected to therapeutic activity and ambulated to gym. Patient followed one step commands to negotiate four 6" steps x 3 with supervision and max cues. Seated simple sorting task to match bean bag color to same colored floor spot from field of 2-3 bean bags. Patient initially required total cues to complete task faded to mod cues to correctly sort one color at a time with 70% accuracy. Patient identified 2 errors and corrected with min questioning cues but unable to correct other errors. Performed NuStep using BLE only at level 4 with mod cues for attention to task, patient terminated activity after 5 min and ambulated back to room with supervision and no cues for path finding. Patient nonverbal but nodded yes/no to questions throughout session. Patient left in bed, mod I in room with stop sign in place.   Therapy Documentation Precautions:  Precautions Precautions: Fall,  Cervical Precaution Comments:   Restrictions Weight Bearing Restrictions: No Pain: Pain Assessment Pain Assessment: No/denies pain   See Function Navigator for Current Functional Status.   Therapy/Group: Individual Therapy  Timica Marcom, Prudencio PairRebecca A 09/13/2016, 4:32 PM

## 2016-09-14 ENCOUNTER — Inpatient Hospital Stay (HOSPITAL_COMMUNITY): Payer: Self-pay

## 2016-09-14 ENCOUNTER — Inpatient Hospital Stay (HOSPITAL_COMMUNITY): Payer: Self-pay | Admitting: Physical Therapy

## 2016-09-14 ENCOUNTER — Inpatient Hospital Stay (HOSPITAL_COMMUNITY): Payer: Medicaid Other | Admitting: Speech Pathology
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
## 2016-09-14 NOTE — Progress Notes (Signed)
Social Work Patient ID: [name], male   DOB: 1981/05/04, 36 y.o.   MRN: [id number]   Met with mother and fiance to review team conference.  Both aware that team continues to plan toward 3/20 discharge.  They are confirming that plan now for pt to d/c home with fiance with mother and other family to assist her with covering 24/7 supervision.  They are both aware that team is available to meet with pt's children to provide TBI education in addition to education for fiance and family.  Still need to schedule this and will keep team posted.  Evah Rashid, LCSW

## 2016-09-14 NOTE — Progress Notes (Signed)
Occupational Therapy Session Note  Patient Details  Name: [name] MRN: [id number] Date of Birth: 11/06/1980  Today's Date: 09/14/2016 OT Individual Time: [id number] OT Individual Time Calculation (min): 54 min    Short Term Goals: Week 4:  OT Short Term Goal 1 (Week 4): STG=LTG  Skilled Therapeutic Interventions/Progress Updates:    Pt resting in bed upon arrival.  Pt acknowledged therapist and arose from bed when requested to take a shower.  Pt completed shower while standing with min verbal cues to put water and soap on wash cloth.  Pt amb into room and selected clothing.  Pt completed dressing without assistance.  Pt stood to thread pants using table to support for balance.  Pt amb to therapy gym and engaged in peg board and pipe tree activities.  Pt unable to replicate peg board pattern or pipe tree structure.  Pt returned to room and straightened out sheet on bed before laying on bed awaiting his next therapy.  Focus on task initiation, sequencing, problem solving, safety awareness, cognitive remediation, and activity tolerance to increase independence with BADLs.   Therapy Documentation Precautions:  Precautions Precautions: Fall, Cervical Precaution Comments:   Restrictions Weight Bearing Restrictions: No Pain:    See Function Navigator for Current Functional Status.   Therapy/Group: Individual Therapy  [name], [name] 09/14/2016, 8:00 AM

## 2016-09-14 NOTE — Progress Notes (Signed)
Patient ID: [name], male   DOB: Apr 05, 1981, 36 y.o.   MRN: [id number] G tube removed without difficulty. Keep plain gauze on site and change daily until closes.  Justin GelinasBurke Santosha Jividen, MD, MPH, FACS Trauma: [phone] General Surgery: [phone]

## 2016-09-14 NOTE — Progress Notes (Signed)
Physical Therapy Session Note  Patient Details  Name: [name] MRN: [id number] Date of Birth: [birth year]  Today's Date: 09/14/2016 PT Individual Time: 0920-0959 PT Individual Time Calculation (min): 39 min    Skilled Therapeutic Interventions/Progress Updates:    With faces pain scale, pt points to pain at 4/5/6 at initiation of session, however, does not point to location of pain.  Treatment initiated while patient supine in bed.  Session today focused on following commands for pathfinding and task adherence.  Pt performed standing dribbling of basketball, attempting having pt focus on switching hands during dribbling task, however, pt unable to follow commands for completion of activity.  Attempted errorless learning with matching black checkers to black squares and red to red; pt able to improve accuracy after 3 attempts.  Additionally worked on pt sorting fruit according to name, pt demonstrated correct answer approx 15% of the time.  Able to complete, stairs, transfers with supervision to no assistance.  Pt returned to room in bed and nursing notified of pt location.  Therapy Documentation Precautions:  Precautions Precautions: Fall, Cervical Precaution Comments:   Restrictions Weight Bearing Restrictions: No  See Function Navigator for Current Functional Status.   Therapy/Group: Individual Therapy  [name] Elveria RisingM Aleea Hendry 09/14/2016, 10:07 AM

## 2016-09-14 NOTE — Progress Notes (Signed)
Physical Therapy Note  Patient Details  Name: [name] MRN: [id number] Date of Birth: [birth year] Today's Date: 09/14/2016    Time: 1515-1555 40 minutes  1:1 No signs/symptoms of pain.  Pt in bed upon PT arrival. Easily gets up when PT arrives.  Attempt to have pt participate in circling winning teams on NCAA bracket.  Initially pt able to complete with min cuing for choice of 2 teams. Then pt perseverating on circling all teams.  Attempt to have pt complete simple addition problems.  Initially pt able to complete with min cuing, then perseverates on certain numbers and unable to complete. Pt participated in sorting coins with mod visual and verbal cuing.  Attempt to have pt count and write down how much money he had, pt perseverates on 1 number during entire task and unable to correctly count money. Pt able to gait to restroom and use restroom safely at end of session.   Dekari Bures 09/14/2016, 4:17 PM

## 2016-09-14 NOTE — Progress Notes (Signed)
Physical Therapy Session Note  Patient Details  Name: [name] MRN: [id number] Date of Birth: [birth year]  Today's Date: 09/14/2016 PT Individual Time: 1345-1500 PT Individual Time Calculation (min): 75 min   Short Term Goals: Week 4:  PT Short Term Goal 1 (Week 4): STG = LTG due to estimated d/c date.  Skilled Therapeutic Interventions/Progress Updates:    Pt sitting on EOB upon arrival and agreeable to participate with PT. Ambulation on unit performed at variable distances  including path finding activities. Pt able to find own room but unable to find gym.  Incorporating basic dribbling during ambulation, dominate with Rt UE, occasional use of LT UE. Sitting>standing balance activities performed with pt preference for dribbling and shooting ball as basket. Pt able to reach for ball at variable angles/heights. Sorting tasks attempted with cards, Environmental consultantchecker board. Pt returned to room after session and denies any complaints.   Therapy Documentation Precautions:  Precautions Precautions: Fall, Cervical Precaution Comments:   Restrictions Weight Bearing Restrictions: No   Pain:  Pt indicates having some pain at site of PEG tube (removed today), monitored during session.   See Function Navigator for Current Functional Status.   Therapy/Group: Individual Therapy  Christiane HaBenjamin J. Navada Osterhout, PT, CSCS Pager [phone] Office [phone]  09/14/2016, 4:01 PM

## 2016-09-14 NOTE — Progress Notes (Signed)
Speech Language Pathology Daily Session Note  Patient Details  Name: [name] MRN: [id number] Date of Birth: 1980-10-02  Today's Date: 09/14/2016 SLP Individual Time: [id number] SLP Individual Time Calculation (min): 45 min  Short Term Goals: Week 4: SLP Short Term Goal 1 (Week 4): Patient will verbalize at the word level in 25% of opportunities with Max A multimodal cues.  SLP Short Term Goal 2 (Week 4): Patient will demonstrate sustained attention to task for ~5 minutes with Max A multimodal cues.  SLP Short Term Goal 3 (Week 4): Patient will demonstrate functional problem solving with Mod A verbal cues for basic and familiar tasks.  SLP Short Term Goal 4 (Week 4): Patient will consume current diet without overt s/s of aspiration with Min A verbal cues for use of swallowing compensatory strategies.  SLP Short Term Goal 5 (Week 4): Patient will demonstrate efficient mastication and complete oral clearance with trials of Dys. 2 textures with Min A verbal cues over 2 sessions prior to upgrade.   Skilled Therapeutic Interventions: Skilled treatment session focused on dysphagia and cognitive goals. Patient consumed trials of Dys. 3 textures and demonstrated efficient mastication without overt s/s of aspiration. Recommend trial tray prior to upgrade. SLP facilitated session by providing Mod A multimodal cues for patient to name functional items via written expression. Patient was 75% accurate but required total A to self-monitor and correct spelling errors at the word level due to severe perseveration of letters. Patient continues to be nonverbal despite Max A multimodal cues Patient demonstrated sustained attention to task for ~15 minutes with Min A verbal cues. Patient left supine in bed with all needs within reach. Continue with current plan of care.   Function:  Eating Eating   Modified Consistency Diet: Yes Eating Assist Level: Supervision or verbal cues   Eating Set Up Assist For:  Opening containers       Cognition Comprehension Comprehension assist level: Understands basic 50 - 74% of the time/ requires cueing 25 - 49% of the time  Expression   Expression assist level: Expresses basis less than 25% of the time/requires cueing >75% of the time.  Social Interaction Social Interaction assist level: Interacts appropriately 25 - 49% of time - Needs frequent redirection.  Problem Solving Problem solving assist level: Solves basic 25 - 49% of the time - needs direction more than half the time to initiate, plan or complete simple activities  Memory Memory assist level: Recognizes or recalls less than 25% of the time/requires cueing greater than 75% of the time    Pain No indications of pain   Therapy/Group: Individual Therapy  Kerensa Nicklas 09/14/2016, 4:12 PM

## 2016-09-15 ENCOUNTER — Inpatient Hospital Stay (HOSPITAL_COMMUNITY): Payer: Self-pay | Admitting: Physical Therapy

## 2016-09-15 ENCOUNTER — Inpatient Hospital Stay (HOSPITAL_COMMUNITY): Payer: Self-pay

## 2016-09-15 ENCOUNTER — Inpatient Hospital Stay (HOSPITAL_COMMUNITY): Payer: Medicaid Other | Admitting: Occupational Therapy

## 2016-09-15 ENCOUNTER — Inpatient Hospital Stay (HOSPITAL_COMMUNITY): Payer: Medicaid Other | Admitting: Speech Pathology

## 2016-09-15 MED ORDER — HYDROCHLOROTHIAZIDE 12.5 MG PO CAPS
12.5000 mg | ORAL_CAPSULE | Freq: Every day | ORAL | Status: DC
  Administered 2016-09-15 – 2016-09-19 (×5): 12.5 mg via ORAL
  Filled 2016-09-15 (×5): qty 1

## 2016-09-15 NOTE — Progress Notes (Signed)
Occupational Therapy Session Note  Patient Details  Name: [name] MRN: [id number] Date of Birth: 1981-03-05  Today's Date: 09/15/2016 OT Individual Time: 1300-1355 and 9:15-10:00 OT Individual Time Calculation (min): 55 min    Short Term Goals: Week 4:  OT Short Term Goal 1 (Week 4): STG=LTG  Skilled Therapeutic Interventions/Progress Updates:    Session 1: Pt seen supine in bed upon arrival, shaking head no when asked about pain and agreeable to tx. Pt ambulates throughout room/unit with supervision and VC for safety awareness and path finding. Pt stands in shower and washes 10/10 body parts with VC to apply soap to wash rag and wash LB. Pt dons pull over shirt back wards and with Vc reorients shirt. Pt able to don denim pants and belt with VC to thread belt through belt loops. When cued to brush teeth/apply lotion pt perseverates on washing hands. With redirection and MAX VC to brushes teeth/applies lotion. Pt demo difficulty appropriately terminating tasks leaving caps off of bottles/leaving water running. In brain injury gym with door open, pt able to put together pipe tree figure and match picture when given all pieces +2 extra pieces. Pt requires VC to break down task. Pt easily distracted by outside noise. Pt returned to room supine in bed with fiance present and all needs met.  Session 2: Pt seen supine in bed upon arrival, shaking head no when asked about pain and agreeable to tx. Pt ambulates throughout room/unit with supervision and VC for safety awareness and path finding. Focus of session on cognitive remediation, sorting and following rules. Pt able to set up checker board and place black pieces on black squares as demonstrated by OT. Pt appropriately follows game of checkers ~5 min before perseverating on moving the wrong pieces. Pt able to observe OT "jump" his piece and when opportunity came, pt able to "jump" OTs piece. Pt able to complete pattern of pegs on peg board with MAX  VC and 5/10 pieces correct. In kitchen standing at sink, Pt sorts dried silverware 10/20 pieces correctly into silverware container with MAX VC, limiting choices to 2 options and redirection to task as pt gets distracted by hallway noise with increased time to improve accuracy. In standing with supervision, Pt able to copy 3 rows of bowling pin pyramid, however unable to align 4th row despite VC and visual diagram.  Pt returned to room supine in bed with fiance present and all needs met.  Therapy Documentation Precautions:  Precautions Precautions: Fall, Cervical Precaution Comments:   Restrictions Weight Bearing Restrictions: No General:   ADL ADL Comments: see Functional Assessment Tool    See Function Navigator for Current Functional Status.   Therapy/Group: Individual Therapy  Tonny Branch 09/15/2016, 3:41 PM

## 2016-09-15 NOTE — Progress Notes (Signed)
Speech Language Pathology Daily Session Note  Patient Details  Name: Justin ElseDarren T Page MRN: [id number] Date of Birth: 02/25/1981  Today's Date: 09/15/2016 SLP Individual Time: 1030-1130 SLP Individual Time Calculation (min): 60 min  Short Term Goals: Week 4: SLP Short Term Goal 1 (Week 4): Patient will verbalize at the word level in 25% of opportunities with Max A multimodal cues.  SLP Short Term Goal 2 (Week 4): Patient will demonstrate sustained attention to task for ~5 minutes with Max A multimodal cues.  SLP Short Term Goal 3 (Week 4): Patient will demonstrate functional problem solving with Mod A verbal cues for basic and familiar tasks.  SLP Short Term Goal 4 (Week 4): Patient will consume current diet without overt s/s of aspiration with Min A verbal cues for use of swallowing compensatory strategies.  SLP Short Term Goal 5 (Week 4): Patient will demonstrate efficient mastication and complete oral clearance with trials of Dys. 2 textures with Min A verbal cues over 2 sessions prior to upgrade.   Skilled Therapeutic Interventions: Skilled treatment session focused on cognitive-linguistic goals. SLP facilitated session by providing Max A and encouragement for use of verbal expression to communicate wants/needs in a functional and familiar situation (ordering cookie from subway) without success. Patient required total A for problem solving and navigation during task. Patient answered basic yes/no question in regards to basic biographical information with use of head nods with 70% accuracy and followed 1 and 2 step directions with extra time and Min A verbal cues. Patient handed off to OT. Continue with current plan of care.      Function:  Cognition Comprehension Comprehension assist level: Understands basic 50 - 74% of the time/ requires cueing 25 - 49% of the time  Expression   Expression assist level: Expresses basis less than 25% of the time/requires cueing >75% of the time.  Social  Interaction Social Interaction assist level: Interacts appropriately 25 - 49% of time - Needs frequent redirection.  Problem Solving Problem solving assist level: Solves basic 25 - 49% of the time - needs direction more than half the time to initiate, plan or complete simple activities  Memory Memory assist level: Recognizes or recalls less than 25% of the time/requires cueing greater than 75% of the time    Pain No indications of pain  Therapy/Group: Individual Therapy  [name] 09/15/2016, 3:42 PM

## 2016-09-15 NOTE — Progress Notes (Signed)
Speech Language Pathology Weekly Progress and Session Note  Patient Details  Name: [name] MRN: [id number] Date of Birth: February 27, 1981  Beginning of progress report period: September 08, 2016 End of progress report period: September 15, 2016  Today's Date: 09/15/2016 SLP Individual Time: 1225-1250 SLP Individual Time Calculation (min): 25 min  Short Term Goals: Week 4: SLP Short Term Goal 1 (Week 4): Patient will verbalize at the word level in 25% of opportunities with Max A multimodal cues.  SLP Short Term Goal 1 - Progress (Week 4): Not met SLP Short Term Goal 2 (Week 4): Patient will demonstrate sustained attention to task for ~5 minutes with Max A multimodal cues.  SLP Short Term Goal 2 - Progress (Week 4): Met SLP Short Term Goal 3 (Week 4): Patient will demonstrate functional problem solving with Mod A verbal cues for basic and familiar tasks.  SLP Short Term Goal 3 - Progress (Week 4): Not met SLP Short Term Goal 4 (Week 4): Patient will consume current diet without overt s/s of aspiration with Min A verbal cues for use of swallowing compensatory strategies.  SLP Short Term Goal 4 - Progress (Week 4): Met SLP Short Term Goal 5 (Week 4): Patient will demonstrate efficient mastication and complete oral clearance with trials of Dys. 2 textures with Min A verbal cues over 2 sessions prior to upgrade.  SLP Short Term Goal 5 - Progress (Week 4): Met    New Short Term Goals: Week 5: SLP Short Term Goal 1 (Week 5): Patient will verbalize at the word level in 25% of opportunities with Max A multimodal cues.  SLP Short Term Goal 2 (Week 5): Patient will demonstrate sustained attention to task for ~15 minutes with Mod A multimodal cues.  SLP Short Term Goal 3 (Week 5): Patient will demonstrate functional problem solving with Mod A verbal cues for basic and familiar tasks.  SLP Short Term Goal 4 (Week 5): Patient will consume current diet without overt s/s of aspiration with supervision verbal  cues for use of swallowing compensatory strategies.  SLP Short Term Goal 5 (Week 5): Patient will demonstrate efficient mastication and complete oral clearance with trials of regular textures with Min A verbal cues over 2 sessions prior to upgrade.   Weekly Progress Updates: Patient has made excellent gains and has met 3 of 5 STG's this reporting period. Currently, patient is consuming Dys. 3 textures with thin liquids without overt s/s of aspiration and requires Min A verbal cues for use of swallowing compensatory strategies. Patient demonstrates behaviors consistent with a Rancho Level VI with increased initiation with tasks but continues to require overall Max A for functional problem solving and sustained attention to tasks. Patient continues to be nonverbal, suspect due to cognitive impairments rather than a language impairment secondary to patient able to follow 1 and 2 step commands and name functional items via written expression. Patient and family education is ongoing. Patient would benefit from skilled SLP intervention to maximize his swallowing and cognitive function as well as his functional communication in order to maximize his overall functional independence prior to discharge.      Intensity: Minumum of 1-2 x/day, 30 to 90 minutes Frequency: 3 to 5 out of 7 days Duration/Length of Stay: 09/19/16 Treatment/Interventions: English as a second language teacher;Dysphagia/aspiration precaution training;Environmental controls;Functional tasks;Patient/family education;Cognitive remediation/compensation;Internal/external aids;Speech/Language facilitation;Therapeutic Activities   Daily Session  Skilled Therapeutic Interventions: Skilled treatment session focused on dysphagia goals. SLP facilitated session by providing Min A verbal cues for use of small  bites and a slow rate of self-feeding with lunch meal of Dys. 3 textures and thin liquids. Patient demonstrated efficient mastcaiton with complete oral clearance  without overt s/s of aspiration. Therefore, recommend patient upgrade to Dys. 3 textures.  Patient and fiance educated on diet upgrade and appropriate textures. patient left sitting EOB with family present. Continue with current plan of care.      Function:   Eating Eating   Modified Consistency Diet: Yes Eating Assist Level: Supervision or verbal cues           Cognition Comprehension Comprehension assist level: Understands basic 50 - 74% of the time/ requires cueing 25 - 49% of the time  Expression   Expression assist level: Expresses basis less than 25% of the time/requires cueing >75% of the time.  Social Interaction Social Interaction assist level: Interacts appropriately 25 - 49% of time - Needs frequent redirection.  Problem Solving Problem solving assist level: Solves basic 25 - 49% of the time - needs direction more than half the time to initiate, plan or complete simple activities  Memory Memory assist level: Recognizes or recalls less than 25% of the time/requires cueing greater than 75% of the time   Pain No indications of pain   Therapy/Group: Individual Therapy  Silvie Obremski 09/15/2016, 3:48 PM

## 2016-09-15 NOTE — Progress Notes (Signed)
Physical Therapy Session Note  Patient Details  Name: [name] MRN: [id number] Date of Birth: [birth year]  Today's Date: 09/15/2016 PT Individual Time: 1400-1458 PT Individual Time Calculation (min): 58 min   Short Term Goals: Week 4:  PT Short Term Goal 1 (Week 4): STG = LTG due to estimated d/c date.  Skilled Therapeutic Interventions/Progress Updates:    Pt in bed upon arrival, indicates he is willing to participate in PT session. Ambulating 200+ ft throughout department, including pathfinding tasks. Pt able to locate own room from dayroom but needing cues for gym location. Balance activities performed in sitting and standing with ball toss, shooting basketball sitting>standing. Pt reaching for ball at variable angles and heights. Dribbling for coordination, primarily using Rt UE but able to cross over to incorporate Lt. Using connect 4 board, pt able to place tokens, able to perform individual color lines but unable to increase complexity. Attempted stacking dominos but pt returning to room to use toilet. Cues for sequence needed for washing hands. Pt in bed following session.   Therapy Documentation Precautions:  Precautions Precautions: Fall, Cervical Precaution Comments:   Restrictions Weight Bearing Restrictions: No  Pain: Pt does not indicate having any pain during session, monitored throughout.   See Function Navigator for Current Functional Status.   Therapy/Group: Individual Therapy  Christiane HaBenjamin J. Schon Zeiders, PT, CSCS Pager [phone] Office [phone]  09/15/2016, 3:51 PM
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
## 2016-09-15 NOTE — Progress Notes (Signed)
Occupational Therapy Note  Patient Details  Name: [name] MRN: [id number] Date of Birth: Nov 28, 1980  Today's Date: 09/15/2016 OT Individual Time: 1130-1200 OT Individual Time Calculation (min): 30 min   No c/o pain.  Pt seen this session to facilitate attention and follow through with directions with a [place] game. Pt ambulated to day room and made a choice of which chair to sit in.  He assisted this therapist with setting up game with one cue to do so.  Once game started, he needed a cue for each move of his play on which piece to move.  One time pt began to move piece that would surely make tower fall, cued to not move that piece, pt proceeded and tower fell. No reaction from patient, he rebuilt tower again with one cue to begin.  During 2nd round, looking at clock frequently. Reminded pt he would be done at 12.  Completed game with cues for each move.  Ambulated back to room and cued to wash hands. Sat on bed, cued again. Pt started to use water only, cued to use soap, then walked away from sink with soap all over his hands. Demonstrated how to rinse soap then dry and then pt followed. Pt in room with his spouse.     SAGUIER,JULIA 09/15/2016, 12:08 PM

## 2016-09-16 ENCOUNTER — Inpatient Hospital Stay (HOSPITAL_COMMUNITY): Payer: Medicaid Other | Admitting: Speech Pathology

## 2016-09-16 ENCOUNTER — Encounter (HOSPITAL_COMMUNITY): Payer: Self-pay

## 2016-09-16 ENCOUNTER — Inpatient Hospital Stay (HOSPITAL_COMMUNITY): Payer: Self-pay

## 2016-09-16 MED ORDER — PANTOPRAZOLE SODIUM 40 MG PO PACK
40.0000 mg | PACK | Freq: Every day | ORAL | Status: DC
  Administered 2016-09-17 – 2016-09-19 (×3): 40 mg via ORAL
  Filled 2016-09-16 (×3): qty 20
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
## 2016-09-16 NOTE — Progress Notes (Signed)
Report to Amber, RN, to assume care of patient at this time.  

## 2016-09-16 NOTE — Progress Notes (Signed)
Speech Language Pathology Daily Session Note  Patient Details  Name: Elmon ElseDarren T Darsey MRN: [id number] Date of Birth: 1980/11/07  Today's Date: 09/16/2016 SLP Individual Time: 0800-0830 SLP Individual Time Calculation (min): 30 min  Short Term Goals: Week 5: SLP Short Term Goal 1 (Week 5): Patient will verbalize at the word level in 25% of opportunities with Max A multimodal cues.  SLP Short Term Goal 2 (Week 5): Patient will demonstrate sustained attention to task for ~15 minutes with Mod A multimodal cues.  SLP Short Term Goal 3 (Week 5): Patient will demonstrate functional problem solving with Mod A verbal cues for basic and familiar tasks.  SLP Short Term Goal 4 (Week 5): Patient will consume current diet without overt s/s of aspiration with supervision verbal cues for use of swallowing compensatory strategies.  SLP Short Term Goal 5 (Week 5): Patient will demonstrate efficient mastication and complete oral clearance with trials of regular textures with Min A verbal cues over 2 sessions prior to upgrade.   Skilled Therapeutic Interventions: Skilled treatment session focused on cognitive and speech goals. SLP facilitated session by providing Min A verbal cues for problem solving with simple and familiar ADL tasks. Patient consumed his breakfast meal of Dys. 3 textures with thin liquids without overt s/s of aspiration and required Min A verbal cues for use of small bites. Patient left in room with all needs within reach. Continue with current plan of care.      Function:  Eating Eating   Modified Consistency Diet: Yes Eating Assist Level: Supervision or verbal cues           Cognition Comprehension Comprehension assist level: Understands basic 50 - 74% of the time/ requires cueing 25 - 49% of the time  Expression   Expression assist level: Expresses basis less than 25% of the time/requires cueing >75% of the time.  Social Interaction Social Interaction assist level: Interacts  appropriately 25 - 49% of time - Needs frequent redirection.  Problem Solving Problem solving assist level: Solves basic 25 - 49% of the time - needs direction more than half the time to initiate, plan or complete simple activities  Memory Memory assist level: Recognizes or recalls less than 25% of the time/requires cueing greater than 75% of the time    Pain No/Denies Pain   Therapy/Group: Individual Therapy  Garrick Midgley 09/16/2016, 12:35 PM

## 2016-09-16 NOTE — Progress Notes (Signed)
Significant other at bedside and voicing concerns that patient had not received a bath. She states that prior to speaking to this RN regarding her concerns, she has already proceeded to charge RN and requested current NT be removed from patient care team, and another NT reassigned to patient. 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 Juice provided to patient, abdominal dressing (previous PEG site) cleansed with saline, dried, and new gauze dressing applied. Patient and significant other encouraged to use call light or contact staff regarding any concerns, states understanding.

## 2016-09-16 NOTE — Progress Notes (Signed)
Occupational Therapy Session Note  Patient Details  Name: [name] MRN: [id number] Date of Birth: [birth year]  Today's Date: 09/16/2016 OT Individual Time: [id number] OT Individual Time Calculation (min): 44 min    Short Term Goals: Week 5:     Skilled Therapeutic Interventions/Progress Updates:    Pt seen standing in doorway with sheet blocking exit. Pt did not indicate faces scale for pain when asked. Focus of session on cognitive remediation, attention and following 1-2 step commands. In quiet room with door open, pt uses blocks to mimic pattern based off of color with 2 colors available. Pt able to sort remaining blocks by color with MIN VC for errorless learning. Pt unable to sort blocks of different color by the shape painted on the outside when prompted despite limiting options. Pt stands and copies four leaf clover on white board and writes, "[employer] Day" to identify the holiday. Pt easily distracted by outside stimuli and frequently looks out to hallway or checks clock. Pt stands and shoots basketball into hoops. Pt able to follow 2-3 step pattern ( ex: chest pass to therapist, dribble, shoot) with MOD VC to initiate pattern, but unable to follow 4 step pattern without step by step cueing. OT strips bed, and pt remakes it with supervision and VC to finish task to completion instead of sitting down. Pt uses restroom with supervision. Pt washes hand with question cue to apply soap. Exited session with pt in room seated on bed and sheet hung up across doorway.   Therapy Documentation Precautions:  Precautions Precautions: Fall, Cervical Precaution Comments:   Restrictions Weight Bearing Restrictions: No Pain: Pain Assessment Pain Assessment: No/denies pain Pain Score: 0-No pain ADL: ADL ADL Comments: see Functional Assessment Tool E See Function Navigator for Current Functional Status.   Therapy/Group: Individual Therapy  Shon HaleStephanie M Noelle Sease 09/16/2016, 5:16 PM

## 2016-09-17 ENCOUNTER — Inpatient Hospital Stay (HOSPITAL_COMMUNITY): Payer: Self-pay

## 2016-09-17 ENCOUNTER — Inpatient Hospital Stay (HOSPITAL_COMMUNITY): Payer: Medicaid Other | Admitting: Physical Therapy
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
## 2016-09-17 NOTE — Progress Notes (Addendum)
Physical Therapy Session Note  Patient Details  Name: [name] MRN: [id number] Date of Birth: [birth year]  Today's Date: 09/17/2016 PT Individual Time: [id number] PT Individual Time Calculation (min): 60 min   Short Term Goals: Week 4:  PT Short Term Goal 1 (Week 4): STG = LTG due to estimated d/c date.  Skilled Therapeutic Interventions/Progress Updates:  Pt received in room. Session focused on cognitive remediation & functional mobility. Pt ambulated throughout unit without AD & supervision as pt attempted to open various doors & press elevator buttons. Pt utilized nu-step on level 5 x 10 minutes with BLE only with task focusing on BLE strengthening. Pt performed bicep curls and over head press exercises with 6 lbs weights in each BUE with max cuing to attend to task and for technique. Pt engaged in cup stacking with 100% accuracy with therapist providing demonstration first. Pt engaged in sorting bean bags by type of food, meal, etc with 65% accuracy. Pt completed simple addition & subtraction math problems with 50% accuracy and heavy cuing. Pt also required max cuing to complete simple patterns with shapes. [name], but unable to recall correct date, instead writing that it's 10/16/05 with therapist providing max cuing for orientation. Pt able to write children's' names with moderate cuing. At end of session pt left Mod I in room with sheet over door.  Addendum: Pt also engaged in dual task of ambulation while carrying tray stacked with cones without LOB.    Therapy Documentation Precautions:  Precautions Precautions: Fall Restrictions Weight Bearing Restrictions: No  Pain: Faces - no pain.   See Function Navigator for Current Functional Status.   Therapy/Group: Individual Therapy  Sandi MariscalVictoria M Kylin Dubs 09/17/2016, 11:04 AM

## 2016-09-17 NOTE — Progress Notes (Signed)
Occupational Therapy Session Note  Patient Details  Name: [name] MRN: [id number] Date of Birth: [birth year]  Today's Date: 09/17/2016 OT Individual Time: 1131-1200 OT Individual Time Calculation (min): 29 min    Short Term Goals: Week 5:     Skilled Therapeutic Interventions/Progress Updates:    Pt seen seated on EOB no report of pain. When cued pt gathers clothes prior to shower with VC to move onto next item of clothing as pt perseverates on folding unfolding clothes from drawer. Pt showers with set up and no Vc to apply soap to wash rag and bathes 10/10 body parts. Pt cued to gather all clothes, and pt only gathers pants and underwear. Pt perseverates on applying lotion to arms/feet for 3 applications before OT removed lotion. Then pt dons pull over shirt, socks and shoes. Cued pt to brush teeth. Pt grabs lotion. When given toothbrush pt could not locate toothpaste at sink attempting to apply powder and peri-care spry to toothpaste. OT moved toothpaste to more visible part of sink and pt applies toothpaste. Pt brushes teeth and rinses off toothbrush 5x before OT cued to terminate task by handing pt paper towels. Exited session with pt in room seated on EOB with sheet across door.  Therapy Documentation Precautions:  Precautions Precautions: Fall, Cervical Precaution Comments:   Restrictions Weight Bearing Restrictions: No General: General PT Missed Treatment Reason: Patient fatigue Vital Signs:  Pain:   ADL: ADL ADL Comments: see Functional Assessment Tool Exercises:   Other Treatments:    See Function Navigator for Current Functional Status.   Therapy/Group: Individual Therapy  Shon HaleStephanie M Kymani Laursen 09/17/2016, 12:20 PM

## 2016-09-17 NOTE — Progress Notes (Signed)
Physical Therapy Session Note  Patient Details  Name: [name] MRN: [id number] Date of Birth: December 27, 1980  Today's Date: 09/17/2016 PT Individual Time: [id number] PT Individual Time Calculation (min): 45 min   Short Term Goals: Week 1:  PT Short Term Goal 1 (Week 1): Pt will perform functional transfers with +1 assist PT Short Term Goal 1 - Progress (Week 1): Progressing toward goal PT Short Term Goal 2 (Week 1): Pt will consistently follow 1 step commands 50% of the time PT Short Term Goal 2 - Progress (Week 1): Progressing toward goal  Skilled Therapeutic Interventions/Progress Updates:  Pt was seen bedside in the am. Pt transferred supine to edge of bed with no assist and increased time. Pt transferred sit to stand with no assist and increased time. Pt ambulated around unit 300+ feet with no assistive device, S only, no LOB. Treatment focused on reaching and cognition utilizing standing checker board. Following checkers pt returned to room with occasional verbal cues. Pt transferred edge of bed to supine with no assist. Pt sitting up in bed with sheet across the door.   Therapy Documentation Precautions:  Precautions Precautions: Fall, Cervical Precaution Comments:   Restrictions Weight Bearing Restrictions: No General: PT Missed Treatment Reason: Patient fatigue Vital Signs:  Pain: No signs or c/o pain.   See Function Navigator for Current Functional Status.   Therapy/Group: Individual Therapy  Rayford HalstedMitchell, Kaylamarie Swickard G 09/17/2016, 8:51 AM

## 2016-09-18 ENCOUNTER — Inpatient Hospital Stay (HOSPITAL_COMMUNITY): Payer: Self-pay

## 2016-09-18 ENCOUNTER — Inpatient Hospital Stay (HOSPITAL_COMMUNITY): Payer: Medicaid Other | Admitting: Speech Pathology

## 2016-09-18 ENCOUNTER — Inpatient Hospital Stay (HOSPITAL_COMMUNITY): Payer: Medicaid Other | Admitting: Physical Therapy
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
## 2016-09-18 NOTE — Progress Notes (Signed)
Speech Language Pathology Discharge Summary  Patient Details  Name: [name] MRN: [id number] Date of Birth: October 22, 1980  Today's Date: 09/18/2016 SLP Individual Time: 1400-1500 SLP Individual Time Calculation (min): 60 min   Skilled Therapeutic Interventions:   Skilled treatment session focused on addressing cognition goals and wrap of education with significant other, [name]. SLP facilitated session by providing verbal education as well as handouts for reinforcement of current diet restrictions with Dys.3 textures and home management recommendations for cognition.  Significant other verbalized understanding and asked appropriate questions regarding about why [name] is not verbally communicating; reinforced it steaming from a cognitive standpoint.  Attempted a modified version of the [facility] Cognitive Assessment with perseveration noted in visuospatial/executive functioning tasks, naming errors with written words for animals "dag" for lion, "gorellaa" for rhino, and "omemg" for camel.  For attention tasks patient wrote numbers accurately with extra information and exhibited 8 errors during the tapping attention task.  Patient was able to write name and city accurately for orientation information and he was unable to complete other tasks.  Patient ready for discharge.    Patient has met 9 of 9 long term goals.  Patient to discharge at overall Supervision;Max level.  Reasons goals not met: n/a   Clinical Impression/Discharge Summary:    Patient has made functional gains during this rehab admission and has met 9 out of 9 long term goals due to improved functional abilities.  Patient was decannulated and initiated a diet during this stay and has progressed to a Rancho Level VI with Max assist for communication, with written and showing most most effective means at expressing himself.  He also requires Max assist for recall, emergent awareness, selective attention, and basic problem solving with  basic/familiar tasks and requires Supervision assist for utilization of swallowing compensatory strategies to minimize overt s/s of aspiration with Dys.3 textures and thin liquids.  Patient and family education has been completed and patient will discharge home with 24 hour supervision. Patient would benefit from follow up SLP services to continue efforts to maximize cognitive-linguistic abilities in order to maximize his functional independence and further reduce the burden of care.   Care Partner:  Caregiver Able to Provide Assistance: Yes  Type of Caregiver Assistance: Physical;Cognitive  Recommendation:  Home Health SLP;24 hour supervision/assistance;Outpatient SLP  Rationale for SLP Follow Up: Maximize functional communication;Maximize cognitive function and independence;Maximize swallowing safety;Reduce caregiver burden   Equipment: none   Reasons for discharge: Treatment goals met;Discharged from hospital   Patient/Family Agrees with Progress Made and Goals Achieved: Yes   Function:  Eating Eating   Modified Consistency Diet: Yes Eating Assist Level: Supervision or verbal cues           Cognition Comprehension Comprehension assist level: Understands basic 75 - 89% of the time/ requires cueing 10 - 24% of the time  Expression Expression assistive device: Other (Comment) (written ) Expression assist level: Expresses basic 25 - 49% of the time/requires cueing 50 - 75% of the time. Uses single words/gestures.  Social Interaction Social Interaction assist level: Interacts appropriately 25 - 49% of time - Needs frequent redirection.  Problem Solving Problem solving assist level: Solves basic 25 - 49% of the time - needs direction more than half the time to initiate, plan or complete simple activities  Memory Memory assist level: Recognizes or recalls 25 - 49% of the time/requires cueing 50 - 75% of the time   [name]., CCC-SLP [phone]  [name] 09/19/2016, 9:04  AM

## 2016-09-18 NOTE — Progress Notes (Signed)
Occupational Therapy Session Note  Patient Details  Name: [name] MRN: [id number] Date of Birth: 10/04/1980  Today's Date: 09/18/2016 OT Individual Time: [id number] OT Individual Time Calculation (min): 54 min    Short Term Goals: Week 4:  OT Short Term Goal 1 (Week 4): STG=LTG    Skilled Therapeutic Interventions/Progress Updates:    Pt resting in bed upon arrival.  Pt acknowledge therapist's presence with a wave of his hand.  Pt sat EOB and amb into bathroom when requested by therapist.  Pt completed toileting tasks and removed clothing and entered shower.  Pt continues to require verbal cues to initiate some segments in bathing sequence and for sequencing during bathing.  Pt completes all dressing tasks when clothing provided.  Pt transitioned to eating breakfast.  Again, pt continues to required min verbal cues for sequencing and to initiate some segments of task.  Pt remains nonverbal but follows one step commands for functional tasks.  Pt returned to room and bed.  Pt remained in bed with all needs within reach.  Focus on ongoing BADL retraining, task initiation, sequencing, and safety awareness to increase independence with BADLs.   Therapy Documentation Precautions:  Precautions Precautions: Fall, Cervical Precaution Comments:   Restrictions Weight Bearing Restrictions: No General:   Vital Signs: Therapy Vitals Temp: 98.6 F (37 C) Temp Source: Oral Pulse Rate: 74 Resp: 17 BP: 135/81 Patient Position (if appropriate): Lying Oxygen Therapy SpO2: 100 % O2 Device: Not Delivered Pain:   ADL: ADL ADL Comments: see Functional Assessment Tool Exercises:   Other Treatments:    See Function Navigator for Current Functional Status.   Therapy/Group: Individual Therapy  [name], [name] 09/18/2016, 7:58 AM

## 2016-09-18 NOTE — Progress Notes (Signed)
Physical Therapy Discharge Summary  Patient Details  Name: [name] MRN: [id number] Date of Birth: Apr 24, 1981  Today's Date: 09/18/2016 PT Individual Time: [phone] and [id number] PT Individual Time Calculation (min): 55 min and 30 min   Patient has met 10 of 11 long term goals due to improved activity tolerance, improved balance, improved postural control, increased strength, ability to compensate for deficits, improved attention, improved awareness and improved coordination.  Patient to discharge at an ambulatory level Supervision.   Patient's fiance Primitivo Gauze) has been present for family education and reports understanding of all PT recommendations and is aware that pt requires 24 hr supervision 2/2 cognitive deficits.   Reasons goals not met: impaired cognition & attention  Recommendation:  Patient will benefit from ongoing skilled PT services in outpatient setting to continue to advance safe functional mobility, address ongoing impairments in high level balance training, cognitive remediation, and minimize fall risk.  Equipment: None needed  Reasons for discharge: treatment goals met  Patient/family agrees with progress made and goals achieved: Yes  Skilled PT Treatment: Treatment 1: Pt received in room with fiance Primitivo Gauze) present for portions of session. Session focused on pt/caregiver education, functional mobility, cognitive remediation, and balance. Educated [place] on pt's need for 24 hr supervision, safety modifications to make at home (she reports she has alarms on all doors), need to hide car keys, and f/u therapy recommendations; [place] voices understanding of all information. Pt ambulated throughout unit without AD & supervision for safety. Pt negotiated stairs (6" + 3") with single rail and supervision, as Mica reports pt can hold to wall with RUE when negotiating stairs at home. Pt ambulated over uneven surfaces, a ramp, and performed a car transfer from sedan simulated height  with supervision. Pt able to recall that it is March 2018; when asked to recall date pt reported "09/17/16". Pt able to complete patterns of various shapes without assistance, which is an improvement compared to yesterday. Pt utilized cybex kinetron in sitting for BLE strengthening and stood on bosu ball for challenges to balance. Pt able to maintain balance up to 4 seconds at a time without BUE support.  At end of session pt left sitting in room with Primitivo Gauze present to supervise.   Treatment 2: Pt received in room. Pt engaged in pet therapy, appropriately responding and petting dog with encouragement from therapist. In gym pt tossed ball into trampoline and was able to catch it with supervision assist for balance. Pt performed overhead press with 6 lbs weights with encouragement to complete task. Pt assembled simple and minimally complex pipe tree shape with cuing for redirection and from choice of preselected pieces; pt required max cuing for problem solving and error correction when assembling minimally complex shape. At end of session pt left in room with Mica (fiance) present to supervise.   Pt continues to remain nonverbal.  PT Discharge Precautions/Restrictions Precautions Precautions: Fall Restrictions Weight Bearing Restrictions: No  Pain Pain Assessment Pain Assessment: Faces Faces Pain Scale: No hurt  Vision/Perception  R visual field impairment.  R inattention.   Cognition Overall Cognitive Status: Impaired/Different from baseline Arousal/Alertness: Awake/alert Orientation Level: Oriented to person (pt inconsistently oriented to place, time and situation) Attention: Sustained Sustained Attention: Impaired Sustained Attention Impairment: Functional basic Awareness: Impaired Awareness Impairment: Intellectual impairment Problem Solving: Impaired Problem Solving Impairment: Functional basic;Verbal basic Safety/Judgment: Impaired Rancho [employer] Scales of Cognitive  Functioning: Confused/appropriate   Motor  Motor Motor: Within Functional Limits   Mobility Bed Mobility Bed Mobility:  Supine to Sit;Sit to Supine Supine to Sit: 6: Modified independent (Device/Increase time) Sit to Supine: 6: Modified independent (Device/Increase time) Transfers Transfers: Yes Sit to Stand: 6: Modified independent (Device/Increase time) Stand to Sit: 6: Modified independent (Device/Increase time)   Locomotion  Ambulation Ambulation: Yes Ambulation/Gait Assistance: 5: Supervision Ambulation Distance (Feet): 200 Feet Assistive device: None Gait Gait: Yes Gait Pattern: Within Functional Limits Stairs / Additional Locomotion Stairs: Yes Stairs Assistance: 5: Supervision Stair Management Technique: One rail Right Number of Stairs: 36 Height of Stairs:  (6" + 3") Ramp: 5: Supervision Curb: 5: Building services engineer Mobility: No   Balance Balance Balance Assessed: Yes Dynamic Standing Balance Dynamic Standing - Comments: supervision during gait   Extremity Assessment  RLE Assessment RLE Assessment: Within Functional Limits LLE Assessment LLE Assessment: Within Functional Limits   See Function Navigator for Current Functional Status.  Waunita Schooner 09/18/2016, 2:16 PM

## 2016-09-18 NOTE — Progress Notes (Signed)
Occupational Therapy Note  Patient Details  Name: Elmon ElseDarren T Fero MRN: [id number] Date of Birth: 01/07/1981  Today's Date: 09/18/2016 OT Individual Time: 1100-1155 OT Individual Time Calculation (min): 55 min   Pt with no s/s of pain Individual Therapy  Pt sitting EOB upon arrival with fiancee present.  Focus on tub transfers and ongoing education.  Discussed home safety recommendations including removal of knives (any weapons) and any vehicle keys.  Pt's fiancee stated she had a lock box for keys and alarms on bedroom door and all home doors.  Discussed recommendation for 24 hour supervision.  Discussed pt's ongoing need for occasional verbal cues to initiate and complete tasks 2/2 decreased initiation and perseveration.  Pt's fiancee verbalized understanding of all recommendations.  Pt remained seated EOB with fiancee present at end of session.    Lavone NeriLanier, [facility] 09/18/2016, 12:01 PM

## 2016-09-18 NOTE — Plan of Care (Signed)
Problem: RH Stairs Goal: LTG Patient will ambulate up and down stairs w/assist (PT) LTG: Patient will ambulate up and down # of stairs with assistance (PT)  Outcome: Completed/Met Date Met: 09/18/16 36 steps with single rail & supervision  Problem: RH Attention Goal: LTG Patient will demonstrate focused/sustained (PT) LTG:  Patient will demonstrate focused/sustained/selective/alternating/divided attention during functional mobility in specific environment with assist for # of minutes  (PT)  Outcome: Not Met (add Reason) Requires cuing

## 2016-09-18 NOTE — Discharge Summary (Signed)
Discharge summary job # [id number]

## 2016-09-19 MED ORDER — TRAZODONE HCL 100 MG PO TABS: 100.0000 mg | ORAL_TABLET | Freq: Every day | ORAL | 1 refills | Status: DC

## 2016-09-19 MED ORDER — METOPROLOL TARTRATE 100 MG PO TABS: 100.0000 mg | ORAL_TABLET | Freq: Two times a day (BID) | ORAL | 0 refills | Status: DC

## 2016-09-19 MED ORDER — HYDROCODONE-ACETAMINOPHEN 7.5-325 MG/15ML PO SOLN: 15.0000 mL | ORAL | 0 refills | Status: DC | PRN

## 2016-09-19 MED ORDER — LEVETIRACETAM 250 MG PO TABS: 250.0000 mg | ORAL_TABLET | Freq: Two times a day (BID) | ORAL | 1 refills | Status: DC

## 2016-09-19 MED ORDER — CALCIUM POLYCARBOPHIL 625 MG PO TABS: 625.0000 mg | ORAL_TABLET | Freq: Every day | ORAL | 0 refills | Status: DC

## 2016-09-19 MED ORDER — HYDROCHLOROTHIAZIDE 12.5 MG PO CAPS: 12.5000 mg | ORAL_CAPSULE | Freq: Every day | ORAL | 1 refills | Status: DC

## 2016-09-19 MED ORDER — METHYLPHENIDATE HCL 5 MG PO TABS: 15.0000 mg | ORAL_TABLET | Freq: Two times a day (BID) | ORAL | 0 refills | Status: DC

## 2016-09-19 MED FILL — levETIRAcetam 250 MG TABS: 250 | 30 days supply | Qty: 60 | Fill #0

## 2016-09-19 MED FILL — METHYLPHENIDATE 5 MG TABLET: 5 | 30 days supply | Qty: 180 | Fill #0

## 2016-09-19 MED FILL — HYDROCOD-APAP 7.5-325/15ML: 7.5-325 | 2 days supply | Qty: 120 | Fill #0

## 2016-09-19 MED FILL — METOPROLOL TARTRATE 100 MG: 100 | 30 days supply | Qty: 60 | Fill #0

## 2016-09-19 MED FILL — traZODone HCL 100 MG TABS: 100 | 30 days supply | Qty: 30 | Fill #0

## 2016-09-19 MED FILL — HYDROCHLOROTHIAZIDE 12.5 MG: 12.5 | 30 days supply | Qty: 30 | Fill #0
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
## 2016-09-19 NOTE — Progress Notes (Signed)
Occupational Therapy Discharge Summary  Patient Details  Name: [name] MRN: [id number] Date of Birth: 05/30/1981  Patient has met 8 of 9 long term goals due to improved activity tolerance, improved balance, postural control, ability to compensate for deficits, functional use of  RIGHT upper, RIGHT lower, LEFT upper and LEFT lower extremity, improved attention, improved awareness and improved coordination.  Pt made steady progress with BADLs during this admission and completes bathing/dressing tasks with supervision and mod/max verbal cues for initiation and sequencing.  Pt performs all functional transfers at supervision level.  Pt continues to be nonverbal but follows commands/requests with min verbal cues.  [name] has participated in therapy and has verbalized understanding of team recommendations.  Pt exhibits behaviors consistent with Rancho Level VI. Patient to discharge at overall Supervision level.  Patient's care partner is independent to provide the necessary physical and cognitive assistance at discharge.    Also Clinical Specialist Willeen Cass met with fiance and mother and 4 children to discuss BI safety and education related to progress and transition to home.   Reasons goals not met: Pt still requires mod A for selective attention to complete functional tasks at this time.   Recommendation:  Patient will benefit from ongoing skilled OT services in home health setting to continue to advance functional skills in the area of BADL and Reduce care partner burden; however due to f/u benefits will only get SLP services at this time.   Equipment: No equipment provided  Reasons for discharge: treatment goals met and discharge from hospital  Patient/family agrees with progress made and goals achieved: Yes  OT Discharge   ADL ADL ADL Comments: see Functional Assessment Tool Vision/Perception  Vision- History Baseline Vision/History: Wears glasses Wears Glasses: At all  times Vision- Assessment Vision Assessment?: Vision impaired- to be further tested in functional context  Cognition Overall Cognitive Status: Impaired/Different from baseline Arousal/Alertness: Awake/alert Orientation Level: Oriented to person;Oriented to situation Attention: Sustained Focused Attention: Appears intact Focused Attention Impairment: Functional basic Sustained Attention: Impaired Sustained Attention Impairment: Functional basic Memory: Impaired Awareness: Impaired Awareness Impairment: Intellectual impairment Problem Solving: Impaired Behaviors: Restless Safety/Judgment: Impaired Rancho [employer] Scales of Cognitive Functioning: Confused/appropriate Sensation Sensation Light Touch: Appears Intact Stereognosis: Not tested Hot/Cold: Appears Intact Proprioception: Appears Intact Coordination Gross Motor Movements are Fluid and Coordinated: Yes Fine Motor Movements are Fluid and Coordinated: Yes Motor  Motor Motor: Within Functional Limits    Trunk/Postural Assessment  Cervical Assessment Cervical Assessment: Within Functional Limits Thoracic Assessment Thoracic Assessment: Within Functional Limits Lumbar Assessment Lumbar Assessment: Within Functional Limits Postural Control Postural Control: Within Functional Limits  Balance Dynamic Sitting Balance Sitting balance - Comments: indepenent Extremity/Trunk Assessment RUE Assessment RUE Assessment: Within Functional Limits LUE Assessment LUE Assessment: Within Functional Limits   See Function Navigator for Current Functional Status.  Leotis Shames [facility] 09/19/2016, 6:53 AM

## 2016-09-19 NOTE — Progress Notes (Signed)
Pt discharged to home with girlfriend and mother.

## 2016-09-19 NOTE — Discharge Instructions (Signed)
Inpatient Rehab Discharge Instructions  Justin ElseDarren T Page Discharge date and time: No discharge date for patient encounter.   Activities/Precautions/ Functional Status: Activity: activity as tolerated Diet: Soft Wound Care: none needed Functional status:  ___ No restrictions     ___ Walk up steps independently ___ 24/7 supervision/assistance   ___ Walk up steps with assistance ___ Intermittent supervision/assistance  ___ Bathe/dress independently ___ Walk with walker     _x__ Bathe/dress with assistance ___ Walk Independently    ___ Shower independently ___ Walk with assistance    ___ Shower with assistance ___ No alcohol     ___ Return to work/school ________    COMMUNITY REFERRALS UPON DISCHARGE:    Home Health:    [facility]                     Agency:  [facility] Phone: [phone]    GENERAL COMMUNITY RESOURCES FOR PATIENT/FAMILY:  Support Groups: Brain Injury Support Group (will mail information each month)      Special Instructions: No smoking driving or alcohol   My questions have been answered and I understand these instructions. I will adhere to these goals and the provided educational materials after my discharge from the hospital.  Patient/Caregiver Signature _______________________________ Date __________  Clinician Signature _______________________________________ Date __________  Please bring this form and your medication list with you to all your follow-up doctor's appointments.

## 2016-09-21 ENCOUNTER — Ambulatory Visit: Payer: Medicaid Other | Attending: Internal Medicine | Admitting: Physician Assistant

## 2016-09-21 ENCOUNTER — Telehealth: Payer: Self-pay | Admitting: *Deleted

## 2016-09-21 VITALS — BP 146/84 | HR 77 | Temp 98.3°F | Wt 222.8 lb

## 2016-09-21 DIAGNOSIS — Z931 Gastrostomy status: Secondary | ICD-10-CM | POA: Diagnosis not present

## 2016-09-21 DIAGNOSIS — J45909 Unspecified asthma, uncomplicated: Secondary | ICD-10-CM | POA: Diagnosis not present

## 2016-09-21 DIAGNOSIS — Z930 Tracheostomy status: Secondary | ICD-10-CM | POA: Insufficient documentation

## 2016-09-21 DIAGNOSIS — G4701 Insomnia due to medical condition: Secondary | ICD-10-CM | POA: Insufficient documentation

## 2016-09-21 DIAGNOSIS — D62 Acute posthemorrhagic anemia: Secondary | ICD-10-CM | POA: Diagnosis not present

## 2016-09-21 DIAGNOSIS — Z8249 Family history of ischemic heart disease and other diseases of the circulatory system: Secondary | ICD-10-CM | POA: Insufficient documentation

## 2016-09-21 DIAGNOSIS — S065XAA Traumatic subdural hemorrhage with loss of consciousness status unknown, initial encounter: Secondary | ICD-10-CM

## 2016-09-21 DIAGNOSIS — I10 Essential (primary) hypertension: Secondary | ICD-10-CM

## 2016-09-21 DIAGNOSIS — S062X3S Diffuse traumatic brain injury with loss of consciousness of 1 hour to 5 hours 59 minutes, sequela: Secondary | ICD-10-CM

## 2016-09-21 DIAGNOSIS — S065X9A Traumatic subdural hemorrhage with loss of consciousness of unspecified duration, initial encounter: Secondary | ICD-10-CM | POA: Diagnosis not present

## 2016-09-21 DIAGNOSIS — Z87820 Personal history of traumatic brain injury: Secondary | ICD-10-CM | POA: Insufficient documentation

## 2016-09-21 DIAGNOSIS — I6200 Nontraumatic subdural hemorrhage, unspecified: Secondary | ICD-10-CM | POA: Diagnosis not present

## 2016-09-21 DIAGNOSIS — S069X9D Unspecified intracranial injury with loss of consciousness of unspecified duration, subsequent encounter: Secondary | ICD-10-CM | POA: Diagnosis not present

## 2016-09-21 MED ORDER — HYDROCHLOROTHIAZIDE 12.5 MG PO CAPS: 12.5000 mg | ORAL_CAPSULE | Freq: Every day | ORAL | 5 refills | Status: DC

## 2016-09-21 MED ORDER — TRAZODONE HCL 100 MG PO TABS: 100.0000 mg | ORAL_TABLET | Freq: Every day | ORAL | 5 refills | Status: DC

## 2016-09-21 MED ORDER — METOPROLOL TARTRATE 100 MG PO TABS: 100.0000 mg | ORAL_TABLET | Freq: Two times a day (BID) | ORAL | 5 refills | Status: DC

## 2016-09-21 MED ORDER — LEVETIRACETAM 250 MG PO TABS: 250.0000 mg | ORAL_TABLET | Freq: Two times a day (BID) | ORAL | 5 refills | Status: DC

## 2016-09-21 NOTE — Telephone Encounter (Signed)
Speech therapist called asking for verbal orders for social work and speech therapy to address communication and swallow function 1week4.  Verbal orders given.......Marland Kitchen. Also,  Speech therapist is also asking us to proactively place orders for outpatient speech [facility] at [facility] to start the week of April 16th so patient may have a smooth transition from home health to outpatient. May we proceed with outpatient neurorehab orders?
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
## 2016-09-22 NOTE — Telephone Encounter (Signed)
neurorehab orders made

## 2016-09-25 NOTE — Progress Notes (Signed)
Social Work  Discharge Note  The overall goal for the admission was met for:   Discharge location: Yes - home with fiance who will work with mother and family to cover 24/7 supervision  Length of Stay: Yes - 31 days  Discharge activity level: Yes - supervision  Home/community participation: Yes  Services provided included: MD, RD, PT, OT, SLP, RN, TR, Pharmacy and SW  Financial Services: Medicaid and SSD applications pending still at time of d/c  Follow-up services arranged: Home Health: [facility] via [place], Other: referred pt to [facility] program for one month medication assist and Patient/Family has no preference for HH/DME agencies  Comments (or additional information): Pt referred to [facility] for primary medical care  Patient/Family verbalized understanding of follow-up arrangements: Yes  Individual responsible for coordination of the follow-up plan: Mother  Confirmed correct DME delivered: Lennart Pall 09/25/2016    Zayon Trulson, Lorre Nick

## 2016-09-26 ENCOUNTER — Encounter: Payer: Self-pay | Admitting: Family Medicine

## 2016-09-26 ENCOUNTER — Ambulatory Visit: Payer: Medicaid Other | Attending: Family Medicine | Admitting: Family Medicine

## 2016-09-26 VITALS — BP 135/88 | HR 78 | Temp 98.4°F | Resp 18 | Ht 73.0 in | Wt 223.8 lb

## 2016-09-26 DIAGNOSIS — Z973 Presence of spectacles and contact lenses: Secondary | ICD-10-CM

## 2016-09-26 DIAGNOSIS — I10 Essential (primary) hypertension: Secondary | ICD-10-CM | POA: Diagnosis not present

## 2016-09-26 DIAGNOSIS — J4520 Mild intermittent asthma, uncomplicated: Secondary | ICD-10-CM

## 2016-09-26 DIAGNOSIS — Z87820 Personal history of traumatic brain injury: Secondary | ICD-10-CM | POA: Insufficient documentation

## 2016-09-26 DIAGNOSIS — M791 Myalgia, unspecified site: Secondary | ICD-10-CM

## 2016-09-26 DIAGNOSIS — G4701 Insomnia due to medical condition: Secondary | ICD-10-CM | POA: Insufficient documentation

## 2016-09-26 MED ORDER — TRAZODONE HCL 50 MG PO TABS: 50.0000 mg | ORAL_TABLET | Freq: Every evening | ORAL | 5 refills | Status: DC | PRN

## 2016-09-26 MED ORDER — ALBUTEROL SULFATE HFA 108 (90 BASE) MCG/ACT IN AERS: 2.0000 | INHALATION_SPRAY | RESPIRATORY_TRACT | 2 refills | Status: DC | PRN

## 2016-09-26 MED ORDER — IBUPROFEN 600 MG PO TABS: 600.0000 mg | ORAL_TABLET | Freq: Three times a day (TID) | ORAL | 0 refills | Status: DC | PRN

## 2016-09-26 MED ORDER — HYDROCHLOROTHIAZIDE 25 MG PO TABS: 25.0000 mg | ORAL_TABLET | Freq: Every day | ORAL | 5 refills | Status: DC

## 2016-09-26 MED FILL — IBUPROFEN 600 MG TABLET: 600 | 10 days supply | Qty: 30 | Fill #0

## 2016-09-26 MED FILL — VENTOLIN HFA 90 MCG INHALER: 108 (90 BAS | 30 days supply | Qty: 18 | Fill #0

## 2016-09-26 MED FILL — HYDROCHLOROTHIAZIDE 25 MG T: 25 | 30 days supply | Qty: 30 | Fill #0

## 2016-09-26 NOTE — Patient Instructions (Addendum)
Schedule follow up appointment with PCP  in 1 month.  Apply for orange card.   DASH Eating Plan DASH stands for "Dietary Approaches to Stop Hypertension." The DASH eating plan is a healthy eating plan that has been shown to reduce high blood pressure (hypertension). It may also reduce your risk for type 2 diabetes, heart disease, and stroke. The DASH eating plan may also help with weight loss. What are tips for following this plan? General guidelines   Avoid eating more than 2,300 mg (milligrams) of salt (sodium) a day. If you have hypertension, you may need to reduce your sodium intake to 1,500 mg a day.  Limit alcohol intake to no more than 1 drink a day for nonpregnant women and 2 drinks a day for men. One drink equals 12 oz of beer, 5 oz of wine, or 1 oz of hard liquor.  Work with your health care provider to maintain a healthy body weight or to lose weight. Ask what an ideal weight is for you.  Get at least 30 minutes of exercise that causes your heart to beat faster (aerobic exercise) most days of the week. Activities may include walking, swimming, or biking.  Work with your health care provider or diet and nutrition specialist (dietitian) to adjust your eating plan to your individual calorie needs. Reading food labels   Check food labels for the amount of sodium per serving. Choose foods with less than 5 percent of the Daily Value of sodium. Generally, foods with less than 300 mg of sodium per serving fit into this eating plan.  To find whole grains, look for the word "whole" as the first word in the ingredient list. Shopping   Buy products labeled as "low-sodium" or "no salt added."  Buy fresh foods. Avoid canned foods and premade or frozen meals. Cooking   Avoid adding salt when cooking. Use salt-free seasonings or herbs instead of table salt or sea salt. Check with your health care provider or pharmacist before using salt substitutes.  Do not fry foods. Cook foods using  healthy methods such as baking, boiling, grilling, and broiling instead.  Cook with heart-healthy oils, such as olive, canola, soybean, or sunflower oil. Meal planning    Eat a balanced diet that includes:  5 or more servings of fruits and vegetables each day. At each meal, try to fill half of your plate with fruits and vegetables.  Up to 6-8 servings of whole grains each day.  Less than 6 oz of lean meat, poultry, or fish each day. A 3-oz serving of meat is about the same size as a deck of cards. One egg equals 1 oz.  2 servings of low-fat dairy each day.  A serving of nuts, seeds, or beans 5 times each week.  Heart-healthy fats. Healthy fats called Omega-3 fatty acids are found in foods such as flaxseeds and coldwater fish, like sardines, salmon, and mackerel.  Limit how much you eat of the following:  Canned or prepackaged foods.  Food that is high in trans fat, such as fried foods.  Food that is high in saturated fat, such as fatty meat.  Sweets, desserts, sugary drinks, and other foods with added sugar.  Full-fat dairy products.  Do not salt foods before eating.  Try to eat at least 2 vegetarian meals each week.  Eat more home-cooked food and less restaurant, buffet, and fast food.  When eating at a restaurant, ask that your food be prepared with less salt or no  salt, if possible. What foods are recommended? The items listed may not be a complete list. Talk with your dietitian about what dietary choices are best for you. Grains  Whole-grain or whole-wheat bread. Whole-grain or whole-wheat pasta. Brown rice. Modena Morrow. Bulgur. Whole-grain and low-sodium cereals. Pita bread. Low-fat, low-sodium crackers. Whole-wheat flour tortillas. Vegetables  Fresh or frozen vegetables (raw, steamed, roasted, or grilled). Low-sodium or reduced-sodium tomato and vegetable juice. Low-sodium or reduced-sodium tomato sauce and tomato paste. Low-sodium or reduced-sodium canned  vegetables. Fruits  All fresh, dried, or frozen fruit. Canned fruit in natural juice (without added sugar). Meat and other protein foods  Skinless chicken or [place]. Ground chicken or [place]. Pork with fat trimmed off. Fish and seafood. Egg whites. Dried beans, peas, or lentils. Unsalted nuts, nut butters, and seeds. Unsalted canned beans. Lean cuts of beef with fat trimmed off. Low-sodium, lean deli meat. Dairy  Low-fat (1%) or fat-free (skim) milk. Fat-free, low-fat, or reduced-fat cheeses. Nonfat, low-sodium ricotta or cottage cheese. Low-fat or nonfat yogurt. Low-fat, low-sodium cheese. Fats and oils  Soft margarine without trans fats. Vegetable oil. Low-fat, reduced-fat, or light mayonnaise and salad dressings (reduced-sodium). Canola, safflower, olive, soybean, and sunflower oils. Avocado. Seasoning and other foods  Herbs. Spices. Seasoning mixes without salt. Unsalted popcorn and pretzels. Fat-free sweets. What foods are not recommended? The items listed may not be a complete list. Talk with your dietitian about what dietary choices are best for you. Grains  Baked goods made with fat, such as croissants, muffins, or some breads. Dry pasta or rice meal packs. Vegetables  Creamed or fried vegetables. Vegetables in a cheese sauce. Regular canned vegetables (not low-sodium or reduced-sodium). Regular canned tomato sauce and paste (not low-sodium or reduced-sodium). Regular tomato and vegetable juice (not low-sodium or reduced-sodium). Angie Fava. Olives. Fruits  Canned fruit in a light or heavy syrup. Fried fruit. Fruit in cream or butter sauce. Meat and other protein foods  Fatty cuts of meat. Ribs. Fried meat. Berniece Salines. Sausage. Bologna and other processed lunch meats. Salami. Fatback. Hotdogs. Bratwurst. Salted nuts and seeds. Canned beans with added salt. Canned or smoked fish. Whole eggs or egg yolks. Chicken or [place] with skin. Dairy  Whole or 2% milk, cream, and half-and-half. Whole or  full-fat cream cheese. Whole-fat or sweetened yogurt. Full-fat cheese. Nondairy creamers. Whipped toppings. Processed cheese and cheese spreads. Fats and oils  Butter. Stick margarine. Lard. Shortening. Ghee. Bacon fat. Tropical oils, such as coconut, palm kernel, or palm oil. Seasoning and other foods  Salted popcorn and pretzels. Onion salt, garlic salt, seasoned salt, table salt, and sea salt. Worcestershire sauce. Tartar sauce. Barbecue sauce. Teriyaki sauce. Soy sauce, including reduced-sodium. Steak sauce. Canned and packaged gravies. Fish sauce. Oyster sauce. Cocktail sauce. Horseradish that you find on the shelf. Ketchup. Mustard. Meat flavorings and tenderizers. Bouillon cubes. Hot sauce and Tabasco sauce. Premade or packaged marinades. Premade or packaged taco seasonings. Relishes. Regular salad dressings. Where to find more information:  National Heart, Lung, and [place]: https://wilson-eaton.com/  American Heart Association: www.heart.org Summary  The DASH eating plan is a healthy eating plan that has been shown to reduce high blood pressure (hypertension). It may also reduce your risk for type 2 diabetes, heart disease, and stroke.  With the DASH eating plan, you should limit salt (sodium) intake to 2,300 mg a day. If you have hypertension, you may need to reduce your sodium intake to 1,500 mg a day.  When on the DASH eating plan, aim to  eat more fresh fruits and vegetables, whole grains, lean proteins, low-fat dairy, and heart-healthy fats.  Work with your health care provider or diet and nutrition specialist (dietitian) to adjust your eating plan to your individual calorie needs. This information is not intended to replace advice given to you by your health care provider. Make sure you discuss any questions you have with your health care provider. Document Released: 06/08/2011 Document Revised: 06/12/2016 Document Reviewed: 06/12/2016 Elsevier Interactive Patient Education  2017  [employer].
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
## 2016-09-26 NOTE — Progress Notes (Signed)
Patient is here to Establish care.  Patient denies pain at this time.  Patient has taken medication today. Patient has eaten today.

## 2016-10-03 ENCOUNTER — Encounter (HOSPITAL_COMMUNITY): Payer: Self-pay

## 2016-10-03 ENCOUNTER — Emergency Department (HOSPITAL_COMMUNITY): Payer: Medicaid Other

## 2016-10-03 ENCOUNTER — Telehealth: Payer: Self-pay | Admitting: Family Medicine

## 2016-10-03 ENCOUNTER — Emergency Department (HOSPITAL_COMMUNITY)
Admission: EM | Admit: 2016-10-03 | Discharge: 2016-10-03 | Disposition: A | Discharge status: Home / Self Care | Payer: Medicaid Other | Attending: Emergency Medicine | Admitting: Emergency Medicine

## 2016-10-03 DIAGNOSIS — R0600 Dyspnea, unspecified: Secondary | ICD-10-CM | POA: Insufficient documentation

## 2016-10-03 DIAGNOSIS — J45909 Unspecified asthma, uncomplicated: Secondary | ICD-10-CM | POA: Diagnosis not present

## 2016-10-03 DIAGNOSIS — I10 Essential (primary) hypertension: Secondary | ICD-10-CM | POA: Insufficient documentation

## 2016-10-03 DIAGNOSIS — R9431 Abnormal electrocardiogram [ECG] [EKG]: Secondary | ICD-10-CM | POA: Insufficient documentation

## 2016-10-03 DIAGNOSIS — R0602 Shortness of breath: Secondary | ICD-10-CM | POA: Diagnosis present

## 2016-10-03 DIAGNOSIS — Z79899 Other long term (current) drug therapy: Secondary | ICD-10-CM | POA: Insufficient documentation

## 2016-10-03 LAB — CBC
HCT: 42.3 % (ref 39.0–52.0)
Hemoglobin: 14 g/dL (ref 13.0–17.0)
MCH: 31 pg (ref 26.0–34.0)
MCHC: 33.1 g/dL (ref 30.0–36.0)
MCV: 93.6 fL (ref 78.0–100.0)
Platelets: 224 10*3/uL (ref 150–400)
RBC: 4.52 MIL/uL (ref 4.22–5.81)
RDW: 14.2 % (ref 11.5–15.5)
WBC: 6.8 10*3/uL (ref 4.0–10.5)

## 2016-10-03 LAB — BASIC METABOLIC PANEL
ANION GAP: 9 (ref 5–15)
BUN: 9 mg/dL (ref 6–20)
CALCIUM: 9.3 mg/dL (ref 8.9–10.3)
CO2: 25 mmol/L (ref 22–32)
Chloride: 103 mmol/L (ref 101–111)
Creatinine, Ser: 0.82 mg/dL (ref 0.61–1.24)
GFR calc Af Amer: >60 mL/min (ref >60)
GFR calc non Af Amer: >60 mL/min (ref >60)
GLUCOSE: 173 mg/dL — AB (ref 65–99)
Potassium: 3.2 mmol/L — ABNORMAL LOW (ref 3.5–5.1)
Sodium: 137 mmol/L (ref 135–145)

## 2016-10-03 LAB — I-STAT TROPONIN, ED
TROPONIN I, POC: 0 ng/mL (ref 0.00–0.08)
Troponin i, poc: 0 ng/mL (ref 0.00–0.08)

## 2016-10-03 LAB — BRAIN NATRIURETIC PEPTIDE: B Natriuretic Peptide: 2.4 pg/mL (ref 0.0–100.0)

## 2016-10-03 LAB — D-DIMER, QUANTITATIVE: D-Dimer, Quant: 0.33 ug/mL-FEU (ref 0.00–0.50)

## 2016-10-03 NOTE — Telephone Encounter (Signed)
Family member called the office asking to speak with nurse in regards to the pain that patient is experiencing as well as shortness of breath. Please follow up.  Thank you.

## 2016-10-03 NOTE — ED Notes (Signed)
Family at bedside reports that pt is poor historian r/t recovery from TBI.

## 2016-10-03 NOTE — ED Triage Notes (Signed)
Patient complains of shortness of breath and chest pain x 2 days. Has recently been in the hospital since January following TBI. Denies cold, no cough

## 2016-10-03 NOTE — Telephone Encounter (Signed)
CMA call back to advice him to go to urgent care or the ED   Spoke with Vernetta Honey & she was aware and understood
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
## 2016-10-04 DIAGNOSIS — F1210 Cannabis abuse, uncomplicated: Secondary | ICD-10-CM | POA: Diagnosis not present

## 2016-10-04 DIAGNOSIS — J45909 Unspecified asthma, uncomplicated: Secondary | ICD-10-CM | POA: Diagnosis not present

## 2016-10-04 DIAGNOSIS — R1310 Dysphagia, unspecified: Secondary | ICD-10-CM | POA: Diagnosis not present

## 2016-10-04 DIAGNOSIS — F1010 Alcohol abuse, uncomplicated: Secondary | ICD-10-CM | POA: Diagnosis not present

## 2016-10-04 DIAGNOSIS — S06363S Traumatic hemorrhage of cerebrum, unspecified, with loss of consciousness of 1 hours to 5 hours 59 minutes, sequela: Secondary | ICD-10-CM | POA: Diagnosis not present

## 2016-10-04 DIAGNOSIS — S0182XS Laceration with foreign body of other part of head, sequela: Secondary | ICD-10-CM | POA: Diagnosis not present

## 2016-10-04 DIAGNOSIS — I10 Essential (primary) hypertension: Secondary | ICD-10-CM | POA: Diagnosis not present

## 2016-10-04 DIAGNOSIS — D62 Acute posthemorrhagic anemia: Secondary | ICD-10-CM | POA: Diagnosis not present

## 2016-10-04 DIAGNOSIS — S062X3S Diffuse traumatic brain injury with loss of consciousness of 1 hour to 5 hours 59 minutes, sequela: Secondary | ICD-10-CM | POA: Diagnosis not present

## 2016-10-04 DIAGNOSIS — S27322S Contusion of lung, bilateral, sequela: Secondary | ICD-10-CM | POA: Diagnosis not present

## 2016-10-10 ENCOUNTER — Ambulatory Visit: Payer: Medicaid Other | Attending: Family Medicine | Admitting: *Deleted

## 2016-10-10 VITALS — BP 128/78 | HR 62 | Resp 16

## 2016-10-10 DIAGNOSIS — I10 Essential (primary) hypertension: Secondary | ICD-10-CM | POA: Diagnosis not present
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
## 2016-10-16 ENCOUNTER — Telehealth: Payer: Self-pay | Admitting: Family Medicine

## 2016-10-16 MED FILL — levETIRAcetam 250 MG TABS: 250 | 30 days supply | Qty: 60 | Fill #1

## 2016-10-16 MED FILL — traZODone HCL 100 MG TABS: 100 | 30 days supply | Qty: 30 | Fill #1

## 2016-10-16 MED FILL — METOPROLOL TARTRATE 100 MG: 100 | 30 days supply | Qty: 60 | Fill #0

## 2016-10-16 NOTE — Telephone Encounter (Signed)
Refill request on methylphenidate (RITALIN) 5 MG tablet.   [facility]  Thank you.

## 2016-10-17 ENCOUNTER — Other Ambulatory Visit: Payer: Self-pay | Admitting: Family Medicine

## 2016-10-17 ENCOUNTER — Ambulatory Visit: Payer: No Typology Code available for payment source | Admitting: Speech Pathology

## 2016-10-17 NOTE — Telephone Encounter (Signed)
CMA call emergency contact [name] regarding about the medication she requesting   Patient did not answer but left a detailed message & if have any questions just to call back

## 2016-10-17 NOTE — Telephone Encounter (Signed)
Orders for ritalin were prescribed by provider with outpatient neuro rehabilitation services. I do not see specifications about how long patient would need to be maintained on ritalin. Recommend patient's caregiver follow up with his neurology provider's office about prescription refill.

## 2016-10-18 ENCOUNTER — Ambulatory Visit: Payer: Self-pay | Attending: Family Medicine

## 2016-10-18 NOTE — Telephone Encounter (Signed)
Emergency contact return CMA call  CMA explain that the RX that she was requesting came from his neurology provider so she would have to f/up with them in order to get it refill   Emergency contact was aware and understood

## 2016-10-23 ENCOUNTER — Encounter: Payer: Self-pay | Admitting: Physical Medicine & Rehabilitation

## 2016-10-23 ENCOUNTER — Encounter: Payer: Medicaid Other | Attending: Physical Medicine & Rehabilitation | Admitting: Physical Medicine & Rehabilitation

## 2016-10-23 VITALS — BP 139/87 | HR 70 | Resp 14

## 2016-10-23 DIAGNOSIS — F068 Other specified mental disorders due to known physiological condition: Secondary | ICD-10-CM

## 2016-10-23 DIAGNOSIS — G4701 Insomnia due to medical condition: Secondary | ICD-10-CM

## 2016-10-23 DIAGNOSIS — G479 Sleep disorder, unspecified: Secondary | ICD-10-CM

## 2016-10-23 DIAGNOSIS — S069X0S Unspecified intracranial injury without loss of consciousness, sequela: Secondary | ICD-10-CM

## 2016-10-23 DIAGNOSIS — G894 Chronic pain syndrome: Secondary | ICD-10-CM

## 2016-10-23 DIAGNOSIS — S06309A Unspecified focal traumatic brain injury with loss of consciousness of unspecified duration, initial encounter: Secondary | ICD-10-CM | POA: Diagnosis present

## 2016-10-23 DIAGNOSIS — F1210 Cannabis abuse, uncomplicated: Secondary | ICD-10-CM | POA: Diagnosis not present

## 2016-10-23 DIAGNOSIS — I10 Essential (primary) hypertension: Secondary | ICD-10-CM | POA: Diagnosis not present

## 2016-10-23 DIAGNOSIS — Z5181 Encounter for therapeutic drug level monitoring: Secondary | ICD-10-CM

## 2016-10-23 DIAGNOSIS — Z79899 Other long term (current) drug therapy: Secondary | ICD-10-CM

## 2016-10-23 DIAGNOSIS — F1010 Alcohol abuse, uncomplicated: Secondary | ICD-10-CM | POA: Diagnosis not present

## 2016-10-23 DIAGNOSIS — J45909 Unspecified asthma, uncomplicated: Secondary | ICD-10-CM | POA: Diagnosis not present

## 2016-10-23 DIAGNOSIS — S062X3S Diffuse traumatic brain injury with loss of consciousness of 1 hour to 5 hours 59 minutes, sequela: Secondary | ICD-10-CM

## 2016-10-23 MED ORDER — TRAZODONE HCL 100 MG PO TABS: 100.0000 mg | ORAL_TABLET | Freq: Every day | ORAL | 4 refills | Status: DC

## 2016-10-23 MED ORDER — METHYLPHENIDATE HCL 10 MG PO TABS: 10.0000 mg | ORAL_TABLET | Freq: Two times a day (BID) | ORAL | 0 refills | Status: DC

## 2016-10-23 MED FILL — METHYLPHENIDATE 10 MG TAB: 10 | 30 days supply | Qty: 60 | Fill #0

## 2016-10-23 MED FILL — HYDROCHLOROTHIAZIDE 25 MG T: 25 | 30 days supply | Qty: 30 | Fill #1
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
## 2016-10-23 NOTE — Patient Instructions (Addendum)
SLEEP  TARGET AROUND 10PM FOR BEDTIME GOAL IS 8-10 HOURS OF SLEEP, CONSISTENTLY   NO SMOKING/DRINKING!!!!   CONTINUE WITH ROUTINE/PLAN AT HOME.

## 2016-10-23 NOTE — Addendum Note (Signed)
Addended by: Faith Rogue T on: 10/23/2016 11:33 AM   Modules accepted: Level of Service

## 2016-10-24 ENCOUNTER — Ambulatory Visit: Payer: Medicaid Other | Attending: Physical Medicine & Rehabilitation | Admitting: Speech Pathology

## 2016-10-24 DIAGNOSIS — R41841 Cognitive communication deficit: Secondary | ICD-10-CM | POA: Diagnosis present
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
## 2016-10-24 NOTE — Patient Instructions (Signed)
   Cognitive Activities you can do at home:   - Solitaire  - Majong  - Scrabble  - Chess/Checkers  - Crosswords (easy level)  - Juanna Cao  - Card Games  - Board Games  - Connect 4  - Simon  - the Memory Game  - Dominoes  - Backgammon  On your computer, tablet or phone: [email or web address] Neuronation App Memory Match Game App Rush Hour Chocolate   Be aware of high stimulation environments - group conversation, loud bright stores, restaurants - this may cause fatigue as your brain is trying to process all of this stimulation  Take care of your sleep and diet for brain health - Keep a regular schedule  No drinking, smoking, drugs for brain health!!

## 2016-10-30 ENCOUNTER — Encounter: Payer: Self-pay | Admitting: Family Medicine

## 2016-10-30 ENCOUNTER — Ambulatory Visit: Payer: Medicaid Other | Attending: Family Medicine | Admitting: Family Medicine

## 2016-10-30 VITALS — BP 115/75 | HR 60 | Temp 97.9°F | Resp 18 | Ht 75.0 in | Wt 225.4 lb

## 2016-10-30 DIAGNOSIS — Z87820 Personal history of traumatic brain injury: Secondary | ICD-10-CM | POA: Insufficient documentation

## 2016-10-30 DIAGNOSIS — I10 Essential (primary) hypertension: Secondary | ICD-10-CM | POA: Insufficient documentation

## 2016-10-30 DIAGNOSIS — E876 Hypokalemia: Secondary | ICD-10-CM | POA: Diagnosis not present

## 2016-10-30 DIAGNOSIS — Z79899 Other long term (current) drug therapy: Secondary | ICD-10-CM | POA: Diagnosis not present

## 2016-10-30 LAB — POCT UA - MICROALBUMIN
Albumin/Creatinine Ratio, Urine, POC: <30
Creatinine, POC: 200 mg/dL
Microalbumin Ur, POC: 10 mg/L

## 2016-10-30 MED ORDER — POTASSIUM CHLORIDE ER 10 MEQ PO TBCR: 10.0000 meq | EXTENDED_RELEASE_TABLET | Freq: Every day | ORAL | 0 refills | Status: DC

## 2016-10-30 MED FILL — POTASSIUM CL 10 MEQ TAB SA: 10 | 20 days supply | Qty: 20 | Fill #0

## 2016-10-30 NOTE — Patient Instructions (Addendum)
Schedule lab appointment to recheck potassium levels after potassium supplements are complete.  Schedule lab appointment for cholesterol (lipid) lab.  Potassium Salts tablets, extended-release tablets or capsules What is this medicine? POTASSIUM (poe TASS i um) is a natural salt that is important for the heart, muscles, and nerves. It is found in many foods and is normally supplied by a well balanced diet. This medicine is used to treat low potassium. This medicine may be used for other purposes; ask your health care provider or pharmacist if you have questions. COMMON BRAND NAME(S): ED-K+10, Glu-K, K-10, K-8, K-Dur, K-Tab, Kaon-CL, Klor-Con, Klor-Con M10, Klor-Con M15, Klor-Con M20, Klotrix, Micro-K, Micro-K Extencaps, Slow-K What should I tell my health care provider before I take this medicine? They need to know if you have any of these conditions: -Addison's disease -dehydration -diabetes -difficulty swallowing -heart disease -history of high levels of potassium in the blood -irregular heartbeat -kidney disease -recent severe burn -stomach ulcers or other stomach problems -an unusual or allergic reaction to potassium, tartrazine, other medicines, foods, dyes, or preservatives -pregnant or trying to get pregnant -breast-feeding How should I use this medicine? Take this medicine by mouth with a full glass of water. Take with food. Follow the directions on the prescription label. Do not suck on, crush, or chew this medicine. If you have difficulty swallowing, ask the pharmacist how to take. Take your medicine at regular intervals. Do not take it more often than directed. Do not stop taking except on your doctor's advice. Talk to your pediatrician regarding the use of this medicine in children. Special care may be needed. Overdosage: If you think you have taken too much of this medicine contact a poison control center or emergency room at once. NOTE: This medicine is only for you. Do not  share this medicine with others. What if I miss a dose? If you miss a dose, take it as soon as you can. If it is almost time for your next dose, take only that dose. Do not take double or extra doses. What may interact with this medicine? Do not take this medicine with any of the following medications: -eplerenone -certain medicines for stomach problems like atropine; difenoxin and glycopyrrolate -sodium polystyrene sulfonate This medicine may also interact with the following medications: -certain medicines for blood pressure or heart disease like lisinopril, losartan, quinapril, valsartan -medicines for cold or allergies -NSAIDs, medicines for pain and inflammation, like ibuprofen or napoxen -other potassium supplements -salt substitutes -some diuretics This list may not describe all possible interactions. Give your health care provider a list of all the medicines, herbs, non-prescription drugs, or dietary supplements you use. Also tell them if you smoke, drink alcohol, or use illegal drugs. Some items may interact with your medicine. What should I watch for while using this medicine? Visit your doctor or health care professional for regular check ups. You will need lab work done regularly. You may need to be on a special diet while taking this medicine. Ask your doctor. What side effects may I notice from receiving this medicine? Side effects that you should report to your doctor or health care professional as soon as possible: -allergic reactions like skin rash, itching or hives, swelling of the face, lips, or tongue -anxious -black, tarry stools -breathing problems -confusion -heartburn -irregular heartbeat -numbness or tingling in hands or feet -pain when swallowing -unusually weak or tired -weakness, heaviness of legs Side effects that usually do not require medical attention (report to your doctor or health  care professional if they continue or are  bothersome): -diarrhea -nausea -upset stomach -vomiting This list may not describe all possible side effects. Call your doctor for medical advice about side effects. You may report side effects to FDA at 1-800-FDA-1088. Where should I keep my medicine? Keep out of the reach of children. Store at room temperature between 15 and 30 degrees C (59 and 86 degrees F ). Keep bottle closed tightly to protect this medicine from light and moisture. Throw away any unused medicine after the expiration date. NOTE: This sheet is a summary. It may not cover all possible information. If you have questions about this medicine, talk to your doctor, pharmacist, or health care provider.  2018 Elsevier/Gold Standard (2014-11-26 08:55:21)  Managing Your Hypertension Hypertension is commonly called high blood pressure. This is when the force of your blood pressing against the walls of your arteries is too strong. Arteries are blood vessels that carry blood from your heart throughout your body. Hypertension forces the heart to work harder to pump blood, and may cause the arteries to become narrow or stiff. Having untreated or uncontrolled hypertension can cause heart attack, stroke, kidney disease, and other problems. What are blood pressure readings? A blood pressure reading consists of a higher number over a lower number. Ideally, your blood pressure should be below 120/80. The first ("top") number is called the systolic pressure. It is a measure of the pressure in your arteries as your heart beats. The second ("bottom") number is called the diastolic pressure. It is a measure of the pressure in your arteries as the heart relaxes. What does my blood pressure reading mean? Blood pressure is classified into four stages. Based on your blood pressure reading, your health care provider may use the following stages to determine what type of treatment you need, if any. Systolic pressure and diastolic pressure are measured in a  unit called mm Hg. Normal   Systolic pressure: below 120.  Diastolic pressure: below 80. Elevated   Systolic pressure: 120-129.  Diastolic pressure: below 80. Hypertension stage 1     Diastolic pressure: 80-89. Hypertension stage 2   Systolic pressure: 140 or above.  Diastolic pressure: 90 or above. What health risks are associated with hypertension? Managing your hypertension is an important responsibility. Uncontrolled hypertension can lead to:  A heart attack.  A stroke.  A weakened blood vessel (aneurysm).  Heart failure.  Kidney damage.  Eye damage.  Metabolic syndrome.  Memory and concentration problems. What changes can I make to manage my hypertension? Eating and drinking   Eat a diet that is high in fiber and potassium, and low in salt (sodium), added sugar, and fat. An example eating plan is called the DASH (Dietary Approaches to Stop Hypertension) diet. To eat this way:  Eat plenty of fresh fruits and vegetables. Try to fill half of your plate at each meal with fruits and vegetables.  Eat whole grains, such as whole wheat pasta, brown rice, or whole grain bread. Fill about one quarter of your plate with whole grains.  Eat low-fat diary products.  Avoid fatty cuts of meat, processed or cured meats, and poultry with skin. Fill about one quarter of your plate with lean proteins such as fish, chicken without skin, beans, eggs, and tofu.  Avoid premade and processed foods. These tend to be higher in sodium, added sugar, and fat.     Lifestyle   Work with your health care provider to maintain a healthy body weight, or  to lose weight. Ask what an ideal weight is for you.  Get at least 30 minutes of exercise that causes your heart to beat faster (aerobic exercise) most days of the week. Activities may include walking, swimming, or biking.       Monitoring   Monitor your blood pressure at home as told by your health care provider. Your personal  target blood pressure may vary depending on your medical conditions, your age, and other factors.  Have your blood pressure checked regularly, as often as told by your health care provider. Working with your health care provider   Review all the medicines you take with your health care provider because there may be side effects or interactions.  Talk with your health care provider about your diet, exercise habits, and other lifestyle factors that may be contributing to hypertension.  Visit your health care provider regularly. Your health care provider can help you create and adjust your plan for managing hypertension. Will I need medicine to control my blood pressure? Your health care provider may prescribe medicine if lifestyle changes are not enough to get your blood pressure under control, and if:  Your systolic blood pressure is 130 or higher.  Your diastolic blood pressure is 80 or higher. Take medicines only as told by your health care provider. Follow the directions carefully. Blood pressure medicines must be taken as prescribed. The medicine does not work as well when you skip doses. Skipping doses also puts you at risk for problems. Contact a health care provider if:  You think you are having a reaction to medicines you have taken.  You have repeated (recurrent) headaches.  You feel dizzy.  You have swelling in your ankles.  You have trouble with your vision. Get help right away if:  You develop a severe headache or confusion.  You have unusual weakness or numbness, or you feel faint.  You have severe pain in your chest or abdomen.  You vomit repeatedly.  You have trouble breathing. Summary  Hypertension is when the force of blood pumping through your arteries is too strong. If this condition is not controlled, it may put you at risk for serious complications.  Your personal target blood pressure may vary depending on your medical conditions, your age, and other  factors. For most people, a normal blood pressure is less than 120/80.  Hypertension is managed by lifestyle changes, medicines, or both. Lifestyle changes include weight loss, eating a healthy, low-sodium diet, exercising more, and limiting alcohol. This information is not intended to replace advice given to you by your health care provider. Make sure you discuss any questions you have with your health care provider. Document Released: 03/13/2012 Document Revised: 05/17/2016 Document Reviewed: 05/17/2016 Elsevier Interactive Patient Education  2017 [employer].

## 2016-10-30 NOTE — Progress Notes (Signed)
Patient is here for f/up  Patient has taking his current medication  Patient has eaten for today

## 2016-10-30 NOTE — Progress Notes (Signed)
Subjective:  Patient ID: [name], male    DOB: 20-Mar-1981  Age: 36 y.o. MRN: [id number]  CC: Establish Care   HPI [name] presents for    Follow up: Reports following up with his neuro rehab physician appointment. Reports another upcoming appointment in May. Currently receiving speech therapy twice a week. Reports Keppra was stopped and dosage of ritalin was decreased. Denies any seizures. Reports improved sleeping habits and relief of of muscle ache symptoms.   HTN: History of elevated BP's. Denies any CP, SOB, headaches, claudication symptoms, or BLE swelling. Denies smoking or drinking currently.    Outpatient Medications Prior to Visit  Medication Sig Dispense Refill  . albuterol (PROVENTIL HFA;VENTOLIN HFA) 108 (90 Base) MCG/ACT inhaler Inhale 2 puffs into the lungs every 4 (four) hours as needed for wheezing or shortness of breath. 1 Inhaler 2  . hydrochlorothiazide (HYDRODIURIL) 25 MG tablet Take 1 tablet (25 mg total) by mouth daily. 30 tablet 5  . ibuprofen (ADVIL,MOTRIN) 600 MG tablet Take 1 tablet (600 mg total) by mouth every 8 (eight) hours as needed for moderate pain or cramping. 30 tablet 0  . methylphenidate (RITALIN) 10 MG tablet Take 1 tablet (10 mg total) by mouth 2 (two) times daily with breakfast and lunch. 60 tablet 0  . metoprolol (LOPRESSOR) 100 MG tablet Take 1 tablet (100 mg total) by mouth 2 (two) times daily. 60 tablet 5  . polycarbophil (FIBERCON) 625 MG tablet Take 1 tablet (625 mg total) by mouth daily. 30 tablet 0  . traZODone (DESYREL) 100 MG tablet Take 1-2 tablets (100-200 mg total) by mouth at bedtime. 60 tablet 4  . levETIRAcetam (KEPPRA) 250 MG tablet Take 1 tablet (250 mg total) by mouth 2 (two) times daily. (Patient not taking: Reported on 10/30/2016) 60 tablet 5   No facility-administered medications prior to visit.     ROS Review of Systems  Constitutional: Negative.   Eyes: Negative.   Respiratory: Negative.     Cardiovascular: Negative.   Gastrointestinal: Negative.   Musculoskeletal: Negative.   Neurological: Positive for speech difficulty.  Psychiatric/Behavioral:       History of TBI.         Objective:  BP 115/75 (BP Location: Left Arm, Patient Position: Sitting, Cuff Size: Normal)   Pulse 60   Temp 97.9 F (36.6 C) (Oral)   Resp 18   Ht  (1.905 m)   Wt 225 lb 6.4 oz (102.2 kg)   SpO2 100%   BMI 28.17 kg/m   BP/Weight 10/30/2016 10/23/2016 10/10/2016  Systolic BP 115 139 128  Diastolic BP 75 87 78  Wt. (Lbs) 225.4 - -  BMI 28.17 - -     Physical Exam  Constitutional: He is oriented to person, place, and time.  HENT:  Head: Normocephalic and atraumatic.  Right Ear: External ear normal.  Left Ear: External ear normal.  Nose: Nose normal.  Mouth/Throat: Oropharynx is clear and moist.  Eyes: Conjunctivae and EOM are normal. Pupils are equal, round, and reactive to light.  Neck: Normal range of motion.  Cardiovascular: Normal rate, regular rhythm, normal heart sounds and intact distal pulses.   Pulmonary/Chest: Effort normal and breath sounds normal.  Neurological: He is alert and oriented to person, place, and time. He has normal reflexes.  Skin: Skin is warm and dry.  Psychiatric: He has a normal mood and affect. His affect is not inappropriate. His speech is delayed. He is attentive.  Nursing note  and vitals reviewed.  Assessment & Plan:   Problem List Items Addressed This Visit      Cardiovascular and Mediastinum   Essential hypertension - Primary   Patient has not fasted. Will place order for future lipid panel due to history of  Hypertension.   Continue current antihypertensive on current dose.   Relevant Orders   POCT UA - Microalbumin (Completed)   Lipid Panel    Other Visit Diagnoses    Hypokalemia       Most recent BMP showed hypokalemia. Will order supplement for replacement.    Recommend recheck of potassium level after supplementation is  complete.   Relevant Medications   potassium chloride (K-DUR) 10 MEQ tablet   Other Relevant Orders   Basic metabolic panel      Meds ordered this encounter  Medications  . DISCONTD: potassium chloride (K-DUR) 10 MEQ tablet    Sig: Take 1 tablet (10 mEq total) by mouth daily.    Dispense:  20 tablet    Refill:  0    Order Specific Question:   Supervising Provider    Answer:   Quentin Angst [id number]  . potassium chloride (K-DUR) 10 MEQ tablet    Sig: Take 1 tablet (10 mEq total) by mouth daily.    Dispense:  20 tablet    Refill:  0    Follow-up: Return in about 3 months (around 01/29/2017) for Hypertension.   Lizbeth Bark FNP

## 2016-10-31 ENCOUNTER — Ambulatory Visit: Payer: Medicaid Other | Attending: Physical Medicine & Rehabilitation | Admitting: *Deleted

## 2016-10-31 DIAGNOSIS — R41841 Cognitive communication deficit: Secondary | ICD-10-CM

## 2016-10-31 NOTE — Patient Instructions (Addendum)
   Strategies to help memory  1. Use your calendar to write down therapy schedule, appointments, events, activities, stuff going on with your kids  2. Work with Alice Rieger to fill pill organizer - together at first, then gradually increase your independence  3. Get in the habit of writing things down in a consistent place  4. Write down lists of things to do, or shopping needs
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
## 2016-11-02 ENCOUNTER — Ambulatory Visit: Payer: Medicaid Other | Admitting: Speech Pathology

## 2016-11-02 DIAGNOSIS — R41841 Cognitive communication deficit: Secondary | ICD-10-CM

## 2016-11-02 NOTE — Patient Instructions (Signed)
    Stop and think!!   Try to be aware when you have made a mistake in talking  We are working on Attention, problem solving, awareness  Do cognitive activities every day  Cross out days on the calendar (you can use your therapy calendar)  Be aware that loud, noisy, bright places, crowds, restaurants may be harder for your brain to process and you may find that you become frustrated or anxious - take a break - go sit down  Listen to your body - stop and think of why you are feeling that way - tired, over whelmed, hungry
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
## 2016-11-07 ENCOUNTER — Encounter: Payer: Self-pay | Admitting: *Deleted

## 2016-11-10 ENCOUNTER — Ambulatory Visit: Payer: Medicaid Other | Admitting: *Deleted

## 2016-11-10 DIAGNOSIS — R41841 Cognitive communication deficit: Secondary | ICD-10-CM

## 2016-11-10 MED FILL — traZODone HCL 100 MG TABS: 100 | 30 days supply | Qty: 60 | Fill #0
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
## 2016-11-14 ENCOUNTER — Ambulatory Visit: Payer: Medicaid Other | Admitting: Speech Pathology

## 2016-11-14 DIAGNOSIS — R41841 Cognitive communication deficit: Secondary | ICD-10-CM | POA: Diagnosis not present

## 2016-11-14 NOTE — Therapy (Signed)
[facility] [address] [place], [place], [address] Phone: [phone]   Fax:  [phone]  Speech Language Pathology Treatment  Patient Details  Name: [name] MRN: [id number] Date of Birth: 1980-10-19 Referring Provider: Dr. Faith RogueZachary Swartz  Encounter Date: 11/14/2016      End of Session - 11/14/16 1149    Visit Number 5   Number of Visits 17   Date for SLP Re-Evaluation 12/29/16   Authorization Type medicaid pending - receiving ST only, will request 16 visits   SLP Start Time [id number]   SLP Stop Time  0930   SLP Time Calculation (min) 46 min   Activity Tolerance Patient tolerated treatment well      Past Medical History:  Diagnosis Date  . Asthma   . Hypertension   . Smoker     Past Surgical History:  Procedure Laterality Date  . LAPAROTOMY N/A 08/02/2016   Procedure: EXPLORATORY LAPAROTOMY AND REPAIR OF GASTROSTOMY TUBE;  Surgeon: Jimmye NormanJames Wyatt, MD;  [facility];  Service: General;  Laterality: N/A;  . PEG PLACEMENT N/A 07/31/2016   Procedure: PERCUTANEOUS ENDOSCOPIC GASTROSTOMY (PEG) PLACEMENT;  Surgeon: Jimmye NormanJames Wyatt, MD;  [facility];  Service: General;  Laterality: N/A;  . PERCUTANEOUS TRACHEOSTOMY N/A 07/31/2016   Procedure: PERCUTANEOUS TRACHEOSTOMY AT BEDSIDE;  Surgeon: Jimmye NormanJames Wyatt, MD;  [facility];  Service: General;  Laterality: N/A;    There were no vitals filed for this visit.      Subjective Assessment - 11/14/16 0858    Subjective "It went well" re HW   Currently in Pain? No/denies               ADULT SLP TREATMENT - 11/14/16 0859      General Information   Behavior/Cognition Alert;Cooperative     Treatment Provided   Treatment provided Cognitive-Linquistic     Pain Assessment   Pain Assessment No/denies pain     Cognitive-Linquistic Treatment   Treatment focused on Cognition;Patient/family/caregiver education   Skilled Treatment Pt returned homework  with signficant attention to detail and math errors. Pt required max A to ID and correct errors - I asked him if he was surprised by his errors - he replied "I used to be real good at math" Simple functional math and attention to detail choosing food items with specific details and keeping within budget with consistent mod A to attend to details, figure out better deal. Consistent max A for error awareness and attention to details. Discussed with pt how this can affect his work in [employer] and the [employer]. He veralized agreement.      Assessment / Recommendations / Plan   Plan Continue with current plan of care     Progression Toward Goals   Progression toward goals Progressing toward goals          SLP Education - 11/14/16 1146    Education provided Yes   Education Details How attention and awareness of errors can affect his ability to return to work   Starwood HotelsPerson(s) Educated Patient   Methods Explanation;Demonstration   Comprehension Verbalized understanding;Verbal cues required;Need further instruction          SLP Short Term Goals - 11/14/16 1149      SLP SHORT TERM GOAL #1   Title Pt will verbalize 3 cognitive impairments with occasional min A   Time 2   Period Weeks   Status On-going     SLP SHORT TERM GOAL #  2   Title Pt will utilize calendar/external memory aids for orientation and schedule management   Time 2   Period Weeks   Status On-going     SLP SHORT TERM GOAL #3   Title Pt will solve simple functional math, reasoning, organization problems with 80% accuracy and occasional min A   Time 2   Period Weeks   Status On-going     SLP SHORT TERM GOAL #4   Title Pt will alternate attention between 2 simple cognitive linguistic/functional tasks with 80% accuracy on each and occasional min A   Time 2   Period Weeks   Status On-going          SLP Long Term Goals - 11/14/16 1149      SLP LONG TERM GOAL #1   Title Pt will ID and self correct  errors on cognitive linguistic tasks 80% of opportunities with occasional min A   Time 6   Period Weeks   Status On-going     SLP LONG TERM GOAL #2   Title Pt will solve mildly complex time, money, reasoning, organization problems with 80% accuracy and occasional min A   Time 6   Period Weeks   Status On-going     SLP LONG TERM GOAL #3   Title Pt will alternate attention between 2 functional/cognitive tasks with 90% accuracy and rare min A.   Time 6   Period Weeks   Status On-going          Plan - 11/14/16 1147    Clinical Impression Statement Pt distracted by phone 1x this session, when ST stepped out to make copy. Confrontation of errors today with mod A to ID how these errors and cognitive impairments can affect success at work. Max A for attention to details and error awareness. Continue skilled ST to maximize cognition for possible return to work.   Speech Therapy Frequency 2x / week   Treatment/Interventions Compensatory strategies;Functional tasks;Patient/family education;Cognitive reorganization;Internal/external aids;SLP instruction and feedback;Environmental controls   Potential to Achieve Goals Good   Potential Considerations Ability to learn/carryover information;Financial resources   Consulted and Agree with Plan of Care Patient      Patient will benefit from skilled therapeutic intervention in order to improve the following deficits and impairments:   Cognitive communication deficit    Problem List Patient Active Problem List   Diagnosis Date Noted  . Secondary hypertension   . Hyperglycemia   . PEG (percutaneous endoscopic gastrostomy) status (HCC)   . Agitation   . Sleep disturbance   . Other secondary hypertension   . Acute blood loss anemia   . Seizure prophylaxis   . Essential hypertension 08/26/2016  . 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 Urine retention 08/18/2016  . Tracheostomy status  (HCC) 08/18/2016  . Dysphagia 08/18/2016  . 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 Pressure injury of skin 07/22/2016  . TBI (traumatic brain injury) (HCC) 07/08/2016    [name], [name], [name] 11/14/2016, 11:50 AM  [facility] [address] [place], [place], [address] Phone: [phone]   Fax:  [phone]   Name: [name] MRN: [id number] Date of Birth: 1980/08/11

## 2016-11-16 ENCOUNTER — Ambulatory Visit: Payer: Medicaid Other | Admitting: Speech Pathology

## 2016-11-16 DIAGNOSIS — R41841 Cognitive communication deficit: Secondary | ICD-10-CM

## 2016-11-16 MED FILL — HYDROCHLOROTHIAZIDE 25 MG T: 25 | 30 days supply | Qty: 30 | Fill #2

## 2016-11-16 NOTE — Therapy (Signed)
[facility] [address] [place], [place], [address] Phone: [phone]   Fax:  [phone]  Speech Language Pathology Treatment  Patient Details  Name: [name] MRN: [id number] Date of Birth: 09-Aug-1980 Referring Provider: Dr. Faith Rogue  Encounter Date: 11/16/2016      End of Session - 11/16/16 0941    Visit Number 6   Number of Visits 17   Date for SLP Re-Evaluation 12/29/16   Authorization Type medicaid pending - receiving ST only, will request 16 visits   SLP Start Time 0846   SLP Stop Time  0932   SLP Time Calculation (min) 46 min      Past Medical History:  Diagnosis Date  . Asthma   . Hypertension   . Smoker     Past Surgical History:  Procedure Laterality Date  . LAPAROTOMY N/A 08/02/2016   Procedure: EXPLORATORY LAPAROTOMY AND REPAIR OF GASTROSTOMY TUBE;  Surgeon: Jimmye Norman, MD;  Location: [facility] OR;  Service: General;  Laterality: N/A;  . PEG PLACEMENT N/A 07/31/2016   Procedure: PERCUTANEOUS ENDOSCOPIC GASTROSTOMY (PEG) PLACEMENT;  Surgeon: Jimmye Norman, MD;  Location: [facility] ENDOSCOPY;  Service: General;  Laterality: N/A;  . PERCUTANEOUS TRACHEOSTOMY N/A 07/31/2016   Procedure: PERCUTANEOUS TRACHEOSTOMY AT BEDSIDE;  Surgeon: Jimmye Norman, MD;  [facility];  Service: General;  Laterality: N/A;    There were no vitals filed for this visit.      Subjective Assessment - 11/16/16 0852    Subjective "Like waking the kids up , sometimes I forget and wake them up late - one time it was too early and one time it was too late"               ADULT SLP TREATMENT - 11/16/16 0853      General Information   Behavior/Cognition Alert;Cooperative     Treatment Provided   Treatment provided Cognitive-Linquistic     Pain Assessment   Pain Assessment No/denies pain     Cognitive-Linquistic Treatment   Treatment focused on Cognition;Patient/family/caregiver education   Skilled Treatment Pt  had spouse check homework - pt reported he did make errorss and spouse helped him correct them. Alternating attention and recall of rules with visual cues in card sort with usual  min to mod A to reason and recall the pile where nothing can match - he required consistent mod A to processing and retain this rule. Simple attention to detail and reasoning (closet organization) with extended time and occasional min A for error awareness/correction. 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 Pt entered 2 physician appointments in his phone today with rare min A, as he had not yet followed up on using phone for scheule management.      Assessment / Recommendations / Plan   Plan Continue with current plan of care     Progression Toward Goals   Progression toward goals Progressing toward goals            SLP Short Term Goals - 11/16/16 0941      SLP SHORT TERM GOAL #1   Title Pt will verbalize 3 cognitive impairments with occasional min A   Time 2   Period Weeks   Status On-going     SLP SHORT TERM GOAL #2   Title Pt will utilize calendar/external memory aids for orientation and schedule management  Time 2   Period Weeks   Status On-going     SLP SHORT TERM GOAL #3   Title Pt will solve simple functional math, reasoning, organization problems with 80% accuracy and occasional min A   Time 2   Period Weeks   Status On-going     SLP SHORT TERM GOAL #4   Title Pt will alternate attention between 2 simple cognitive linguistic/functional tasks with 80% accuracy on each and occasional min A   Time 2   Period Weeks   Status On-going          SLP Long Term Goals - 11/16/16 0941      SLP LONG TERM GOAL #1   Title Pt will ID and self correct errors on cognitive linguistic tasks 80% of opportunities with occasional min A   Time 6   Period Weeks   Status  On-going     SLP LONG TERM GOAL #2   Title Pt will solve mildly complex time, money, reasoning, organization problems with 80% accuracy and occasional min A   Time 6   Period Weeks   Status On-going     SLP LONG TERM GOAL #3   Title Pt will alternate attention between 2 functional/cognitive tasks with 90% accuracy and rare min A.   Time 6   Period Weeks   Status On-going          Plan - 11/16/16 0936    Clinical Impression Statement Pt continues to require frequent mod A for simple attention to details in directions, organization, error awareness and emergent awareness of cognitive impairments. He has lost money several times and reports he has gotten his kids up too early or too late for school at various times. Instructed to use the alarm on his phone to wake up the kids and put a sign in his room to check his pockets for money when he takes them off. Continue skilled ST to maximize cognition for possible return to work . Continue to await medicaid card. When he gets this, please send information to Misty Stanley and consider making more appointments if cognitive dificts persist.   Duration 2 weeks   Treatment/Interventions Compensatory strategies;Functional tasks;Patient/family education;Cognitive reorganization;Internal/external aids;SLP instruction and feedback;Environmental controls   Potential to Achieve Goals Good   Potential Considerations Ability to learn/carryover information;Financial resources   Consulted and Agree with Plan of Care Patient      Patient will benefit from skilled therapeutic intervention in order to improve the following deficits and impairments:   Cognitive communication deficit    Problem List Patient Active Problem List   Diagnosis Date Noted  . Secondary hypertension   . Hyperglycemia   . PEG (percutaneous endoscopic gastrostomy) status (HCC)   . Agitation   . Sleep disturbance   . Other secondary hypertension   . Acute blood loss anemia   . Seizure  prophylaxis   . Essential hypertension 08/26/2016  . 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 Urine retention 08/18/2016  . 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 Dysphagia 08/18/2016  . 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 Pressure injury of skin 07/22/2016  . TBI (traumatic brain injury) (HCC) 07/08/2016    Valentino Saavedra, Radene Journey  MS, CCC-SLP 11/16/2016, 9:42 AM  [facility] [address] [place], [place], [address] Phone: [phone]   Fax:  [phone]   Name: Justin Regala  Chestine Page MRN: [id number] Date of Birth: 1981/04/12

## 2016-11-16 NOTE — Patient Instructions (Signed)
  Use your phone alarm to help you get the kids up on time, as back up to Shomika's call  Put any questions you have for your doctors in the note section of your phone  Use your calendar or note section to keep track of any phone calls, paperwork, insurance you need to follow up   Put up a sign to check your pockets where you take off your pants  Take a picture of your license and keep it on your phone

## 2016-11-17 MED FILL — METOPROLOL TARTRATE 100 MG: 100 | 30 days supply | Qty: 60 | Fill #1

## 2016-11-21 ENCOUNTER — Ambulatory Visit: Payer: Medicaid Other | Admitting: *Deleted

## 2016-11-21 DIAGNOSIS — R41841 Cognitive communication deficit: Secondary | ICD-10-CM | POA: Diagnosis not present

## 2016-11-21 NOTE — Therapy (Signed)
[facility] [address] [place], [place], [address] Phone: [phone]   Fax:  [phone]  Speech Language Pathology Treatment  Patient Details  Name: Justin ElseDarren T Fournet MRN: [id number] Date of Birth: [birth year] Referring Provider: Dr. Faith RogueZachary Swartz  Encounter Date: 11/21/2016      End of Session - 11/21/16 1057    Visit Number 7   Number of Visits 17   Date for SLP Re-Evaluation 12/29/16   Authorization Type medicaid pending - receiving ST only, will request 16 visits   SLP Start Time 1015   SLP Stop Time  1100   SLP Time Calculation (min) 45 min   Activity Tolerance Patient tolerated treatment well      Past Medical History:  Diagnosis Date  . Asthma   . Hypertension   . Smoker     Past Surgical History:  Procedure Laterality Date  . LAPAROTOMY N/A 08/02/2016   Procedure: EXPLORATORY LAPAROTOMY AND REPAIR OF GASTROSTOMY TUBE;  Surgeon: Jimmye NormanJames Wyatt, MD;  [facility];  Service: General;  Laterality: N/A;  . PEG PLACEMENT N/A 07/31/2016   Procedure: PERCUTANEOUS ENDOSCOPIC GASTROSTOMY (PEG) PLACEMENT;  Surgeon: Jimmye NormanJames Wyatt, MD;  [facility];  Service: General;  Laterality: N/A;  . PERCUTANEOUS TRACHEOSTOMY N/A 07/31/2016   Procedure: PERCUTANEOUS TRACHEOSTOMY AT BEDSIDE;  Surgeon: Jimmye NormanJames Wyatt, MD;  [facility];  Service: General;  Laterality: N/A;    There were no vitals filed for this visit.      Subjective Assessment - 11/21/16 1252    Subjective I got a wallet to help keep up with things. I lost money like twice. It's better now.    Currently in Pain? No/denies               ADULT SLP TREATMENT - 11/21/16 0001      General Information   Behavior/Cognition Alert;Cooperative;Pleasant mood     Treatment Provided   Treatment provided Cognitive-Linquistic     Pain Assessment   Pain Assessment No/denies pain     Cognitive-Linquistic Treatment   Treatment focused on  Cognition;Patient/family/caregiver education   Skilled Treatment Session targeted awareness of deficits, organization, and attention to detail. Pt indicates he does not think he will return to his job at [employer], due to difficulty dealing with authority/belligerence, preparing food - make it right, make it look nice - attention to detail, organize, check to make sure bottles are full, etc. 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 Pt reviewed daily schedule with SLP. According to pt report, he is working 7 days a week, 8am to 10pm. 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 During this discussion, pt worked on another organization/scheduling task. Upon review when pt indicated he was finished, SLP noted that several of the questions had not been answered appropriately. Encouraged pt to finish at home.     Assessment / Recommendations / Plan   Plan Continue with current plan of care     Progression Toward Goals   Progression toward goals Progressing toward goals          SLP Education - 11/21/16 1056    Education provided Yes   Education Details activities for attention/organiation.   Person(s) Educated Patient   Methods Explanation;Demonstration;Verbal cues;Handout   Comprehension Verbalized understanding;Verbal cues required          SLP Short  Term Goals - 11/21/16 1256      SLP SHORT TERM GOAL #1   Title Pt will verbalize 3 cognitive impairments with occasional min A   Time 1   Period Weeks   Status On-going     SLP SHORT TERM GOAL #2   Title Pt will utilize calendar/external memory aids for orientation and schedule management   Time 1   Period Weeks   Status On-going     SLP SHORT TERM GOAL #4   Title Pt will alternate attention between 2 simple cognitive linguistic/functional tasks with 80% accuracy on each and occasional min A   Time 1    Period Weeks   Status On-going          SLP Long Term Goals - 11/21/16 1256      SLP LONG TERM GOAL #1   Title Pt will ID and self correct errors on cognitive linguistic tasks 80% of opportunities with occasional min A   Time 5   Period Weeks   Status On-going     SLP LONG TERM GOAL #2   Title Pt will solve mildly complex time, money, reasoning, organization problems with 80% accuracy and occasional min A   Time 5   Period Weeks   Status On-going     SLP LONG TERM GOAL #3   Title Pt will alternate attention between 2 functional/cognitive tasks with 90% accuracy and rare min A.   Time 5   Period Weeks   Status On-going          Plan - 11/21/16 1253    Clinical Impression Statement Essentially absent eye contact noted this session, but no interaction with his phone. Pt reports acquiring a wallet to keep his money in, and has not lost money since he got it. Pt was encouraged to identify a consistent place to keep items so they would not get lost. 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  Pt continues to require mod cues to fully complete calendar task. Continued ST intervention is recommended to maximize cognitive linguistics for increased independence and possible return to work.   Speech Therapy Frequency 2x / week   Duration 2 weeks   Treatment/Interventions Compensatory strategies;Functional tasks;Patient/family education;Cognitive reorganization;Internal/external aids;SLP instruction and feedback;Environmental controls   Potential to Achieve Goals Good   Potential Considerations Ability to learn/carryover information;Financial resources   SLP Home Exercise Plan reviewed, written information provided   Consulted and Agree with Plan of Care Patient      Patient will benefit from skilled therapeutic intervention in order to improve the following deficits and impairments:   Cognitive communication  deficit    Problem List Patient Active Problem List   Diagnosis Date Noted  . Secondary hypertension   . Hyperglycemia   . PEG (percutaneous endoscopic gastrostomy) status (HCC)   . Agitation   . Sleep disturbance   . Other secondary hypertension   . Acute blood loss anemia   . Seizure prophylaxis   . Essential hypertension 08/26/2016  . 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 Urine retention 08/18/2016  . 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 Dysphagia 08/18/2016  . 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 Pressure injury of skin 07/22/2016  . TBI (traumatic brain injury) (HCC) 07/08/2016   Xian Apostol B. Newell, MSP, CCC-SLP  Leigh Aurora 11/21/2016, 12:57  PM  [facility] [address] [place], [place], [address] Phone: [phone]   Fax:  [phone]   Name: PILAR CORRALES MRN: [id number] Date of Birth: [birth year]

## 2016-11-22 ENCOUNTER — Ambulatory Visit: Payer: Medicaid Other | Attending: Family Medicine

## 2016-11-22 DIAGNOSIS — I10 Essential (primary) hypertension: Secondary | ICD-10-CM | POA: Insufficient documentation

## 2016-11-22 DIAGNOSIS — E876 Hypokalemia: Secondary | ICD-10-CM | POA: Insufficient documentation

## 2016-11-22 NOTE — Patient Instructions (Signed)
Patient is aware of lab results being provided through phone cal.

## 2016-11-23 LAB — BASIC METABOLIC PANEL
BUN / CREAT RATIO: 9 (ref 9–20)
BUN: 9 mg/dL (ref 6–20)
CHLORIDE: 100 mmol/L (ref 96–106)
CO2: 27 mmol/L (ref 18–29)
Calcium: 9.6 mg/dL (ref 8.7–10.2)
Creatinine, Ser: 1.05 mg/dL (ref 0.76–1.27)
GFR calc Af Amer: 107 mL/min/{1.73_m2} (ref >59)
GFR calc non Af Amer: 93 mL/min/{1.73_m2} (ref >59)
GLUCOSE: 118 mg/dL — AB (ref 65–99)
POTASSIUM: 4.2 mmol/L (ref 3.5–5.2)
SODIUM: 141 mmol/L (ref 134–144)

## 2016-11-23 LAB — LIPID PANEL
Chol/HDL Ratio: 4.4 ratio (ref 0.0–5.0)
Cholesterol, Total: 162 mg/dL (ref 100–199)
HDL: 37 mg/dL — ABNORMAL LOW (ref >39)
LDL Calculated: 84 mg/dL (ref 0–99)
TRIGLYCERIDES: 206 mg/dL — AB (ref 0–149)
VLDL Cholesterol Cal: 41 mg/dL — ABNORMAL HIGH (ref 5–40)

## 2016-11-24 ENCOUNTER — Ambulatory Visit: Payer: Medicaid Other | Admitting: *Deleted

## 2016-11-24 DIAGNOSIS — R41841 Cognitive communication deficit: Secondary | ICD-10-CM

## 2016-11-24 NOTE — Patient Instructions (Signed)
   Ways to help at home:  Set an alarm     Don't wait for [name] to remind you  Get up at 6:30 am    Get kids up, beginning at [employer]7am  Collect trash Wednesday   Take trash out Wednesday night  Take recycle out every other Wednesday night  Wash kitchen items as [employer] bakes   Wash dishes     Put dishes away       [employer]Wash clothes     Dry clothes  Fold clothes     Put clothes away  Sweep      Vacuum  Mop the floor     Help kids with homework  Make sure kids clean up their rooms Make a grocery list (pay attention to items running low)  Plan meals for the week   Wash/vacuum car  Mow grass      Clean up/organize bedroom or closet  Summer activities to do with the kids:  Exercise  Reading  Household chores  Help plan meals  Bring dirty clothes to washer  Help fold and put clothes away  Limited screen time
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
## 2016-11-28 ENCOUNTER — Ambulatory Visit: Payer: Medicaid Other | Admitting: *Deleted

## 2016-11-28 DIAGNOSIS — R41841 Cognitive communication deficit: Secondary | ICD-10-CM | POA: Diagnosis not present

## 2016-11-28 NOTE — Patient Instructions (Signed)
  Additional ways to help at home: [place] work with the kids   Begin cooking!!  What are your current concerns preventing you from getting in there??  Divided and alternating attention - doing 2 things at once and going back and forth between tasks.   MEALS TO MAKE Make a sandwich - toast bread, lettuce and tomatoes, meat, cheese, mustard, cut in half, potato chips on plates  Spaghetti - boil water, cook pasta, heat sauce, brown meat, drain meat, cut onions, green peppers, saute vegetables with meat, add to sauce, get pasta out of water, drain, add to sauce. Put noodles and sauce in pan, Put cheddar cheese on top and bake. More adventurous? Make garlic bread and/or a salad.
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
## 2016-11-29 ENCOUNTER — Encounter: Payer: Self-pay | Admitting: Physical Medicine & Rehabilitation

## 2016-11-29 ENCOUNTER — Encounter: Payer: Medicaid Other | Attending: Physical Medicine & Rehabilitation | Admitting: Physical Medicine & Rehabilitation

## 2016-11-29 VITALS — BP 122/74 | HR 55

## 2016-11-29 DIAGNOSIS — Z79899 Other long term (current) drug therapy: Secondary | ICD-10-CM

## 2016-11-29 DIAGNOSIS — S062X3S Diffuse traumatic brain injury with loss of consciousness of 1 hour to 5 hours 59 minutes, sequela: Secondary | ICD-10-CM

## 2016-11-29 DIAGNOSIS — S06309A Unspecified focal traumatic brain injury with loss of consciousness of unspecified duration, initial encounter: Secondary | ICD-10-CM | POA: Insufficient documentation

## 2016-11-29 DIAGNOSIS — F068 Other specified mental disorders due to known physiological condition: Secondary | ICD-10-CM

## 2016-11-29 DIAGNOSIS — F1010 Alcohol abuse, uncomplicated: Secondary | ICD-10-CM | POA: Diagnosis not present

## 2016-11-29 DIAGNOSIS — Z5181 Encounter for therapeutic drug level monitoring: Secondary | ICD-10-CM

## 2016-11-29 DIAGNOSIS — J45909 Unspecified asthma, uncomplicated: Secondary | ICD-10-CM | POA: Insufficient documentation

## 2016-11-29 DIAGNOSIS — F1210 Cannabis abuse, uncomplicated: Secondary | ICD-10-CM | POA: Insufficient documentation

## 2016-11-29 DIAGNOSIS — I10 Essential (primary) hypertension: Secondary | ICD-10-CM | POA: Insufficient documentation

## 2016-11-29 DIAGNOSIS — R451 Restlessness and agitation: Secondary | ICD-10-CM

## 2016-11-29 DIAGNOSIS — G4701 Insomnia due to medical condition: Secondary | ICD-10-CM

## 2016-11-29 DIAGNOSIS — G894 Chronic pain syndrome: Secondary | ICD-10-CM

## 2016-11-29 DIAGNOSIS — S069X0S Unspecified intracranial injury without loss of consciousness, sequela: Secondary | ICD-10-CM

## 2016-11-29 DIAGNOSIS — G479 Sleep disorder, unspecified: Secondary | ICD-10-CM

## 2016-11-29 MED ORDER — AMANTADINE HCL 100 MG PO CAPS: 100.0000 mg | ORAL_CAPSULE | Freq: Two times a day (BID) | ORAL | 3 refills | Status: DC

## 2016-11-29 NOTE — Patient Instructions (Signed)
2 MONTH FACEBOOK HOLIDAY!!!!!!!!!
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
## 2016-11-30 ENCOUNTER — Ambulatory Visit: Payer: Medicaid Other | Admitting: Speech Pathology

## 2016-11-30 DIAGNOSIS — R41841 Cognitive communication deficit: Secondary | ICD-10-CM | POA: Diagnosis not present

## 2016-11-30 NOTE — Therapy (Signed)
[facility] [address] [place], [place], [address] Phone: [phone]   Fax:  [phone]  Speech Language Pathology Treatment  Patient Details  Name: [name] MRN: [id number] Date of Birth: [birth year] Referring Provider: Dr. Alger Simons  Encounter Date: 11/30/2016      End of Session - 11/30/16 2321    Visit Number 10   Number of Visits 17   Date for SLP Re-Evaluation 12/29/16   Authorization Type medicaid pending - receiving ST only, will request 16 visits   SLP Start Time 0418   SLP Stop Time  0501   SLP Time Calculation (min) 43 min   Activity Tolerance Patient tolerated treatment well      Past Medical History:  Diagnosis Date  . Asthma   . Hypertension   . Smoker     Past Surgical History:  Procedure Laterality Date  . LAPAROTOMY N/A 08/02/2016   Procedure: EXPLORATORY LAPAROTOMY AND REPAIR OF GASTROSTOMY TUBE;  Surgeon: Judeth Horn, MD;  Location: [facility];  Service: General;  Laterality: N/A;  . PEG PLACEMENT N/A 07/31/2016   Procedure: PERCUTANEOUS ENDOSCOPIC GASTROSTOMY (PEG) PLACEMENT;  Surgeon: Judeth Horn, MD;  Location: [facility] ENDOSCOPY;  Service: General;  Laterality: N/A;  . PERCUTANEOUS TRACHEOSTOMY N/A 07/31/2016   Procedure: PERCUTANEOUS TRACHEOSTOMY AT BEDSIDE;  Surgeon: Judeth Horn, MD;  Location: [facility];  Service: General;  Laterality: N/A;    There were no vitals filed for this visit.      Subjective Assessment - 11/30/16 1626    Subjective "Paying attention to detail. Trying to get me back to work." re: what he's working on in [place]   Currently in Pain? No/denies               ADULT SLP TREATMENT - 11/30/16 1627      General Information   Behavior/Cognition Alert;Cooperative;Pleasant mood   Patient Positioning Upright in chair   Oral care provided N/A     Treatment Provided   Treatment provided Cognitive-Linquistic     Pain Assessment   Pain Assessment No/denies  pain     Cognitive-Linquistic Treatment   Treatment focused on Cognition;Patient/family/caregiver education   Skilled Treatment SLP facilitated awareness by requesting pt to explain deficits to this unfamiliar clinician. 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 SLP utilized menu from pt's previous place of employment; pt required mod-max cues to verbalize steps and ingredients to familiar cooking tasks. SLP educated pt re: deficits in attention, memory which would prevent him from being able to keep up with the pace of his work and perform required duties; pt acknowledged this with mod cues. Functional math word problems with 60% accuracy, mod-max cues for error awareness.     Assessment / Recommendations / Plan   Plan Continue with current plan of care     Progression Toward Goals   Progression toward goals Not progressing toward goals (comment)  reduced awareness of deficits          SLP Education - 11/30/16 2320    Education provided Yes   Education Details deficit areas   Person(s) Educated Patient   Methods Explanation;Demonstration   Comprehension Verbalized understanding;Verbal cues required          SLP Short Term Goals - 11/30/16 2327      SLP SHORT TERM GOAL #1  Title Pt will verbalize 3 cognitive impairments with occasional min A   Status Partially Met     SLP SHORT TERM GOAL #2   Title Pt will utilize calendar/external memory aids for orientation and schedule management   Status Partially Met     SLP SHORT TERM GOAL #3   Title Pt will solve simple functional math, reasoning, organization problems with 80% accuracy and occasional min A   Status Not Met     SLP SHORT TERM GOAL #4   Title Pt will alternate attention between 2 simple cognitive linguistic/functional tasks with 80% accuracy on each and  occasional min A   Status Not Met          SLP Long Term Goals - 11/30/16 2328      SLP LONG TERM GOAL #1   Title Pt will ID and self correct errors on cognitive linguistic tasks 80% of opportunities with occasional min A   Time 4   Period Weeks   Status On-going     SLP LONG TERM GOAL #2   Title Pt will solve mildly complex time, money, reasoning, organization problems with 80% accuracy and occasional min A   Time 4   Period Weeks   Status On-going     SLP LONG TERM GOAL #3   Title Pt will alternate attention between 2 functional/cognitive tasks with 90% accuracy and rare min A.   Time 4   Period Weeks   Status On-going          Plan - 11/30/16 2322    Clinical Impression Statement Pt states his home exercise was to "cook something." States he completed home assignment; it went fine. Pt continues to struggle with error awareness; requires cues to acknowledge impact of deficits. Continued ST intervention is recommended to maximize cognitive linguistic skills for increased independence and decreased caregiver burden.   Speech Therapy Frequency 2x / week   Treatment/Interventions Compensatory strategies;Functional tasks;Patient/family education;Cognitive reorganization;Internal/external aids;SLP instruction and feedback;Environmental controls   Potential to Achieve Goals Good   Potential Considerations Ability to learn/carryover information;Financial resources;Family/community support;Previous level of function;Cooperation/participation level   SLP Home Exercise Plan list steps to preparing menu items from his previous place of employment   Consulted and Agree with Plan of Care Patient      Patient will benefit from skilled therapeutic intervention in order to improve the following deficits and impairments:   Cognitive communication deficit    Problem List Patient Active Problem List   Diagnosis Date Noted  . Secondary hypertension   . Hyperglycemia   . PEG  (percutaneous endoscopic gastrostomy) status ([place])   . Agitation   . Sleep disturbance   . Other secondary hypertension   . Acute blood loss anemia   . Seizure prophylaxis   . Essential hypertension 08/26/2016  . 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 Urine retention 08/18/2016  . Tracheostomy status ([place]) 08/18/2016  . Dysphagia 08/18/2016  . Cognitive deficit as late effect of traumatic brain injury ([place]) 08/18/2016  . Pressure injury of skin 07/22/2016  . TBI (traumatic brain injury) ([place]) 07/08/2016   Deneise Lever, [place], [place] 11/30/2016, 11:29 PM  [facility] [address] [place], [place], [address] Phone: [phone]   Fax:  [phone]   Name: KEANE MARTELLI MRN: [id number] Date of Birth: Dec 27, 1980

## 2016-12-01 ENCOUNTER — Encounter: Payer: Self-pay | Admitting: *Deleted

## 2016-12-04 ENCOUNTER — Telehealth: Payer: Self-pay

## 2016-12-04 NOTE — Telephone Encounter (Signed)
CMA call regarding lab results  Patient did not answer grandmother answer phone left a message stating to call me back regarding his lab results

## 2016-12-04 NOTE — Telephone Encounter (Signed)
-----   Message from Lizbeth BarkMandesia R Hairston, FNP sent at 11/24/2016 11:53 AM EDT ----- Kidney function normal Lipid levels were elevated. This can increase your risk of heart disease overtime. Start eating a diet low in saturated fat. Limit your intake of fried foods, red meats, and whole milk. Increase physical activity. Recommend follow up in 6 months.

## 2016-12-04 NOTE — Addendum Note (Signed)
Addendum  created 12/04/16 1037 by Keidra Withers, MD   Sign clinical note    

## 2016-12-05 ENCOUNTER — Encounter: Payer: Self-pay | Admitting: *Deleted

## 2016-12-07 ENCOUNTER — Ambulatory Visit: Payer: Medicaid Other | Attending: Physical Medicine & Rehabilitation | Admitting: Speech Pathology

## 2016-12-07 DIAGNOSIS — R41841 Cognitive communication deficit: Secondary | ICD-10-CM | POA: Diagnosis present

## 2016-12-07 NOTE — Therapy (Signed)
[facility] [address] [place], [place], [address] Phone: [phone]   Fax:  [phone]  Speech Language Pathology Treatment  Patient Details  Name: [name] MRN: [id number] Date of Birth: 04-17-1981 Referring Provider: Dr. Alger Simons  Encounter Date: 12/07/2016      End of Session - 12/07/16 1715    Visit Number 11   Number of Visits 17   Date for SLP Re-Evaluation 12/29/16   Authorization Type medicaid pending - receiving ST only, will request 16 visits   SLP Start Time 3646   SLP Stop Time  1702   SLP Time Calculation (min) 45 min      Past Medical History:  Diagnosis Date  . Asthma   . Hypertension   . Smoker     Past Surgical History:  Procedure Laterality Date  . LAPAROTOMY N/A 08/02/2016   Procedure: EXPLORATORY LAPAROTOMY AND REPAIR OF GASTROSTOMY TUBE;  Surgeon: Judeth Horn, MD;  Location: [facility];  Service: General;  Laterality: N/A;  . PEG PLACEMENT N/A 07/31/2016   Procedure: PERCUTANEOUS ENDOSCOPIC GASTROSTOMY (PEG) PLACEMENT;  Surgeon: Judeth Horn, MD;  Location: [facility] ENDOSCOPY;  Service: General;  Laterality: N/A;  . PERCUTANEOUS TRACHEOSTOMY N/A 07/31/2016   Procedure: PERCUTANEOUS TRACHEOSTOMY AT BEDSIDE;  Surgeon: Judeth Horn, MD;  Location: [facility];  Service: General;  Laterality: N/A;    There were no vitals filed for this visit.      Subjective Assessment - 12/07/16 1621    Subjective "Nothing really, trying to get that homework done."   Patient is accompained by: Family member   Currently in Pain? No/denies               ADULT SLP TREATMENT - 12/07/16 1622      General Information   Behavior/Cognition Alert;Cooperative;Pleasant mood   Patient Positioning Upright in chair   Oral care provided N/A     Treatment Provided   Treatment provided Cognitive-Linquistic     Pain Assessment   Pain Assessment No/denies pain     Cognitive-Linquistic Treatment   Treatment  focused on Cognition;Patient/family/caregiver education   Skilled Treatment SLP reviewed pt's homework and facilitated error awareness. Pt required mod verbal cues to identify errors in calculations due to overlooking details in simple math word problems, 50% accuracy overall. 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  Simple mental manipulation of 3 words 75% accuracy, min A for error awareness. Alternating attention with 80% accuracy unscrambling sentences and 80% accuracy on verbal simple addition/subtraction, min A for identification, correction of errors.      Assessment / Recommendations / Plan   Plan Continue with current plan of care     Progression Toward Goals   Progression toward goals Not progressing toward goals (comment)          SLP Education - 12/07/16 1714    Education provided Yes   Education Details check work for errors   [employer]) Educated Patient   Methods Explanation;Demonstration   Comprehension Verbalized understanding;Verbal cues required          SLP Short Term Goals - 12/07/16 1717      SLP SHORT TERM GOAL #1   Title Pt will verbalize 3 cognitive impairments with occasional min A   Baseline Pt verbalizes awareness of no cognitive impairments on eval   Time 1   Period Weeks   Status Partially Met  SLP SHORT TERM GOAL #2   Title Pt will utilize calendar/external memory aids for orientation and schedule management   Baseline Pt not oriented to year or place, currently not using any external memory strategies on eval   Time 1   Period Weeks   Status Partially Met     SLP SHORT TERM GOAL #3   Title Pt will solve simple functional math, reasoning, organization problems with 80% accuracy and occasional min A   Baseline 50% accuracy on eval   Time 2   Period Weeks   Status Not Met     SLP SHORT TERM GOAL #4   Title Pt will alternate attention between 2 simple  cognitive linguistic/functional tasks with 80% accuracy on each and occasional min A   Baseline Pt 60% with selective attention on eval   Time 1   Period Weeks   Status Not Met          SLP Long Term Goals - 12/07/16 1717      SLP LONG TERM GOAL #1   Title Pt will ID and self correct errors on cognitive linguistic tasks 80% of opportunities with occasional min A   Baseline Pt ID'd/corrected no errors at evaluation   Time 3   Period Weeks   Status On-going     SLP LONG TERM GOAL #2   Title Pt will solve mildly complex time, money, reasoning, organization problems with 80% accuracy and occasional min A   Baseline Pt solved simple math, reasoning and organization tasks with 50% accuracy   Time 3   Period Weeks   Status On-going     SLP LONG TERM GOAL #3   Title Pt will alternate attention between 2 functional/cognitive tasks with 90% accuracy and rare min A.   Baseline Pt currently with selective attention with  60% accuracy   Time 3   Period Weeks   Status On-going          Plan - 12/07/16 1716    Clinical Impression Statement Pt with 50% accuracy on home assignment of simple math word problems. Pt continues to struggle with error awareness; requires cues to acknowledge impact of deficits. Continued ST intervention is recommended to maximize cognitive linguistic skills for increased independence and decreased caregiver burden.   Speech Therapy Frequency 2x / week   Treatment/Interventions Compensatory strategies;Functional tasks;Patient/family education;Cognitive reorganization;Internal/external aids;SLP instruction and feedback;Environmental controls   Potential to Achieve Goals Fair   Potential Considerations Ability to learn/carryover information;Financial resources;Family/community support;Previous level of function;Cooperation/participation level   SLP Home Exercise Plan math worksheets   Consulted and Agree with Plan of Care Patient      Patient will benefit from  skilled therapeutic intervention in order to improve the following deficits and impairments:   Cognitive communication deficit    Problem List Patient Active Problem List   Diagnosis Date Noted  . Secondary hypertension   . Hyperglycemia   . PEG (percutaneous endoscopic gastrostomy) status ([place])   . Agitation   . Sleep disturbance   . Other secondary hypertension   . Acute blood loss anemia   . Seizure prophylaxis   . Essential hypertension 08/26/2016  . 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 Urine retention 08/18/2016  . Tracheostomy status ([place]) 08/18/2016  . Dysphagia 08/18/2016  . Cognitive deficit as late effect of traumatic brain injury ([place]) 08/18/2016  . Pressure injury of skin 07/22/2016  . TBI (traumatic brain injury) (  [place]) 07/08/2016   Deneise Lever, [place], [place] 12/07/2016, 5:18 PM  [facility] [address] [place], [place], [address] Phone: [phone]   Fax:  [phone]   Name: LANELL DUBIE MRN: [id number] Date of Birth: 08-Aug-1980

## 2016-12-08 ENCOUNTER — Ambulatory Visit: Payer: Medicaid Other | Admitting: *Deleted

## 2016-12-08 DIAGNOSIS — R41841 Cognitive communication deficit: Secondary | ICD-10-CM

## 2016-12-08 NOTE — Therapy (Signed)
[facility] [address] [place], [place], [address] Phone: [phone]   Fax:  [phone]  Speech Language Pathology Treatment  Patient Details  Name: [name] MRN: [id number] Date of Birth: Oct 22, 1980 Referring Provider: Dr. Alger Simons  Encounter Date: 12/08/2016      End of Session - 12/08/16 1135    Visit Number 13   Number of Visits 17   Date for SLP Re-Evaluation 12/29/16   SLP Start Time 0930   SLP Stop Time  1020   SLP Time Calculation (min) 50 min   Activity Tolerance Patient tolerated treatment well      Past Medical History:  Diagnosis Date  . Asthma   . Hypertension   . Smoker     Past Surgical History:  Procedure Laterality Date  . LAPAROTOMY N/A 08/02/2016   Procedure: EXPLORATORY LAPAROTOMY AND REPAIR OF GASTROSTOMY TUBE;  Surgeon: Judeth Horn, MD;  Location: [facility];  Service: General;  Laterality: N/A;  . PEG PLACEMENT N/A 07/31/2016   Procedure: PERCUTANEOUS ENDOSCOPIC GASTROSTOMY (PEG) PLACEMENT;  Surgeon: Judeth Horn, MD;  Location: [facility] ENDOSCOPY;  Service: General;  Laterality: N/A;  . PERCUTANEOUS TRACHEOSTOMY N/A 07/31/2016   Procedure: PERCUTANEOUS TRACHEOSTOMY AT BEDSIDE;  Surgeon: Judeth Horn, MD;  Location: [facility];  Service: General;  Laterality: N/A;    There were no vitals filed for this visit.      Subjective Assessment - 12/08/16 0943    Subjective I'm alright   Currently in Pain? No/denies               ADULT SLP TREATMENT - 12/08/16 0001      General Information   Behavior/Cognition Alert;Cooperative;Pleasant mood     Treatment Provided   Treatment provided Cognitive-Linguistic     Pain Assessment   Pain Assessment No/denies pain     Cognitive-Linquistic Treatment   Treatment focused on Cognition;Patient/family/caregiver education   Skilled Treatment ST session targeted increasing error awareness. Pt 20% accurate for word problems sent for homework  which he classified as "Pretty easy". Pt had difficulty verbalizing to SLP how he arrived at his answers. Pt required max cueing to identify errors on a menu, locating only 2 (out of >20) independently.      Assessment / Recommendations / Plan   Plan Continue with current plan of care     Progression Toward Goals   Progression toward goals Not progressing toward goals. Pt may be at or near his baseline level of function.          SLP Education - 12/08/16 1134    Education provided Yes   Education Details math worksheets   Person(s) Educated Patient   Methods Explanation;Demonstration;Handout   Comprehension Verbalized understanding;Verbal cues required          SLP Short Term Goals - 12/08/16 1138      SLP SHORT TERM GOAL #1   Title Pt will verbalize 3 cognitive impairments with occasional min A   Status Partially Met     SLP SHORT TERM GOAL #2   Title Pt will utilize calendar/external memory aids for orientation and schedule management   Status Partially Met     SLP SHORT TERM GOAL #3   Title Pt will solve simple functional math, reasoning, organization problems with 80% accuracy and occasional min A   Status Not Met     SLP SHORT TERM GOAL #4   Title Pt will alternate attention between 2 simple cognitive linguistic/functional tasks with  80% accuracy on each and occasional min A   Status Not Met          SLP Long Term Goals - 12/08/16 1138      SLP LONG TERM GOAL #1   Title Pt will ID and self correct errors on cognitive linguistic tasks 80% of opportunities with occasional min A   Time 3   Status On-going     SLP LONG TERM GOAL #2   Title Pt will solve mildly complex time, money, reasoning, organization problems with 80% accuracy and occasional min A   Time 3   Status On-going     SLP LONG TERM GOAL #3   Title Pt will alternate attention between 2 functional/cognitive tasks with 90% accuracy and rare min A.   Time 3   Status On-going          Plan -  12/08/16 1135    Clinical Impression Statement Pt continues to struggle with error awareness; requires cues to acknowledge impact of deficits. Continued ST intervention is recommended to maximize cognitive linguistic skills for increased independence and decreased caregiver burden, however, pt may be close to or at his baseline level of function.   Speech Therapy Frequency 2x / week   Duration --  8 weeks   Treatment/Interventions Compensatory strategies;Functional tasks;Patient/family education;Cognitive reorganization;Internal/external aids;SLP instruction and feedback;Environmental controls   Potential to Achieve Goals Fair   Potential Considerations Ability to learn/carryover information;Financial resources;Family/community support;Previous level of function;Cooperation/participation level   SLP Home Exercise Plan math worksheets   Consulted and Agree with Plan of Care Patient      Patient will benefit from skilled therapeutic intervention in order to improve the following deficits and impairments:   Cognitive communication deficit    Problem List Patient Active Problem List   Diagnosis Date Noted  . Secondary hypertension   . Hyperglycemia   . PEG (percutaneous endoscopic gastrostomy) status ([facility])   . Agitation   . Sleep disturbance   . Other secondary hypertension   . Acute blood loss anemia   . Seizure prophylaxis   . Essential hypertension 08/26/2016  . 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 Urine retention 08/18/2016  . Tracheostomy status ([facility]) 08/18/2016  . Dysphagia 08/18/2016  . Cognitive deficit as late effect of traumatic brain injury ([facility]) 08/18/2016  . Pressure injury of skin 07/22/2016  . TBI (traumatic brain injury) ([place]) 07/08/2016   Cathi Hazan B. [place], MSP, CCC-SLP  [name] 12/08/2016, 11:39 AM  [facility] [address] [place], [place], [address] Phone: [phone]   Fax:  [phone]   Name: [name] MRN: [id number] Date of Birth: December 02, 1980

## 2016-12-11 MED FILL — traZODone HCL 100 MG TABS: 100 | 30 days supply | Qty: 60 | Fill #1

## 2016-12-12 ENCOUNTER — Ambulatory Visit: Payer: Medicaid Other | Admitting: Speech Pathology

## 2016-12-12 DIAGNOSIS — R41841 Cognitive communication deficit: Secondary | ICD-10-CM
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
## 2016-12-13 ENCOUNTER — Ambulatory Visit: Payer: Medicaid Other | Admitting: Speech Pathology

## 2016-12-13 DIAGNOSIS — R41841 Cognitive communication deficit: Secondary | ICD-10-CM | POA: Diagnosis not present
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
## 2016-12-13 NOTE — Patient Instructions (Signed)
  [employer]  [place] - Logic/IQ puzzle  You look really good - People will not know that you have had a brain injury, therefore, when you are having difficulty - Let others know - ask for help!   People will not understand or connect any changed social behavior with your brain injury - try to say something to excuse yourself or end  a conversation instead of just walking off.  It's OK to let people know that you are restless or having difficulty focusing/concentrating and need to leave a conversation or group.  Call and have friends over or go out to lunch with a good friend. You or Shomika can educate them that you can't drink or smoke while your brain in healing.  Do a cognitive/brain activity everyday - the list of games provided in prior session  Read - use bookmark or blank paper to stay on the right line  Let Dr. Riley KillSwartz know you are still restless and getting frustrated easily

## 2016-12-14 MED FILL — METOPROLOL TARTRATE 100 MG: 100 | 30 days supply | Qty: 60 | Fill #2

## 2016-12-14 MED FILL — HYDROCHLOROTHIAZIDE 25 MG T: 25 | 30 days supply | Qty: 30 | Fill #3

## 2016-12-25 ENCOUNTER — Ambulatory Visit: Payer: Medicaid Other | Admitting: Speech Pathology

## 2016-12-25 DIAGNOSIS — R41841 Cognitive communication deficit: Secondary | ICD-10-CM | POA: Diagnosis not present
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
## 2016-12-25 NOTE — Patient Instructions (Signed)
  Do something for your brain EVERYDAY  Cognitive activity day -   Be aware of lifting too heavy weights, bearing down and putting pressure on your eye - may want to ask the eye doctor about

## 2016-12-29 ENCOUNTER — Ambulatory Visit: Payer: Medicaid Other | Admitting: Speech Pathology

## 2016-12-29 DIAGNOSIS — R41841 Cognitive communication deficit: Secondary | ICD-10-CM | POA: Diagnosis not present
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
## 2017-01-02 ENCOUNTER — Ambulatory Visit: Payer: Medicaid Other | Admitting: *Deleted

## 2017-01-04 ENCOUNTER — Encounter: Payer: Self-pay | Admitting: Speech Pathology

## 2017-01-09 MED FILL — traZODone HCL 100 MG TABS: 100 | 30 days supply | Qty: 60 | Fill #2

## 2017-01-15 MED FILL — METOPROLOL TARTRATE 100 MG: 100 | 30 days supply | Qty: 60 | Fill #3

## 2017-01-15 MED FILL — HYDROCHLOROTHIAZIDE 25 MG T: 25 | 30 days supply | Qty: 30 | Fill #4

## 2017-01-29 ENCOUNTER — Ambulatory Visit: Payer: Self-pay | Admitting: Family Medicine

## 2017-01-30 ENCOUNTER — Encounter: Payer: Medicaid Other | Attending: Physical Medicine & Rehabilitation | Admitting: Physical Medicine & Rehabilitation

## 2017-01-30 ENCOUNTER — Encounter: Payer: Self-pay | Admitting: Physical Medicine & Rehabilitation

## 2017-01-30 VITALS — BP 131/83 | HR 99

## 2017-01-30 DIAGNOSIS — F1010 Alcohol abuse, uncomplicated: Secondary | ICD-10-CM | POA: Diagnosis not present

## 2017-01-30 DIAGNOSIS — J45909 Unspecified asthma, uncomplicated: Secondary | ICD-10-CM | POA: Diagnosis not present

## 2017-01-30 DIAGNOSIS — F068 Other specified mental disorders due to known physiological condition: Secondary | ICD-10-CM | POA: Diagnosis not present

## 2017-01-30 DIAGNOSIS — F1210 Cannabis abuse, uncomplicated: Secondary | ICD-10-CM | POA: Insufficient documentation

## 2017-01-30 DIAGNOSIS — I10 Essential (primary) hypertension: Secondary | ICD-10-CM | POA: Insufficient documentation

## 2017-01-30 DIAGNOSIS — S062X3S Diffuse traumatic brain injury with loss of consciousness of 1 hour to 5 hours 59 minutes, sequela: Secondary | ICD-10-CM | POA: Diagnosis not present

## 2017-01-30 DIAGNOSIS — S06309A Unspecified focal traumatic brain injury with loss of consciousness of unspecified duration, initial encounter: Secondary | ICD-10-CM | POA: Diagnosis not present

## 2017-01-30 DIAGNOSIS — S069X0S Unspecified intracranial injury without loss of consciousness, sequela: Secondary | ICD-10-CM | POA: Diagnosis not present

## 2017-01-30 MED ORDER — LISINOPRIL 10 MG PO TABS: 10.0000 mg | ORAL_TABLET | Freq: Every day | ORAL | 3 refills | Status: DC
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
## 2017-01-30 NOTE — Patient Instructions (Signed)
PLEASE FEEL FREE TO CALL OUR OFFICE WITH ANY PROBLEMS OR QUESTIONS ([phone])      

## 2017-02-05 MED FILL — traZODone HCL 100 MG TABS: 100 | 30 days supply | Qty: 60 | Fill #3

## 2017-02-07 ENCOUNTER — Ambulatory Visit: Payer: Medicaid Other | Attending: Family Medicine | Admitting: Family Medicine

## 2017-02-07 ENCOUNTER — Encounter: Payer: Self-pay | Admitting: Family Medicine

## 2017-02-07 VITALS — BP 127/81 | HR 62 | Temp 98.4°F | Resp 18 | Ht 74.0 in | Wt 248.8 lb

## 2017-02-07 DIAGNOSIS — I10 Essential (primary) hypertension: Secondary | ICD-10-CM | POA: Diagnosis present

## 2017-02-07 MED ORDER — METOPROLOL TARTRATE 100 MG PO TABS: 100.0000 mg | ORAL_TABLET | Freq: Two times a day (BID) | ORAL | 5 refills | Status: DC

## 2017-02-07 NOTE — Patient Instructions (Signed)
Managing Your Hypertension Hypertension is commonly called high blood pressure. This is when the force of your blood pressing against the walls of your arteries is too strong. Arteries are blood vessels that carry blood from your heart throughout your body. Hypertension forces the heart to work harder to pump blood, and may cause the arteries to become narrow or stiff. Having untreated or uncontrolled hypertension can cause heart attack, stroke, kidney disease, and other problems. What are blood pressure readings? A blood pressure reading consists of a higher number over a lower number. Ideally, your blood pressure should be below 120/80. The first ("top") number is called the systolic pressure. It is a measure of the pressure in your arteries as your heart beats. The second ("bottom") number is called the diastolic pressure. It is a measure of the pressure in your arteries as the heart relaxes. What does my blood pressure reading mean? Blood pressure is classified into four stages. Based on your blood pressure reading, your health care provider may use the following stages to determine what type of treatment you need, if any. Systolic pressure and diastolic pressure are measured in a unit called mm Hg. Normal  Systolic pressure: below 120.  Diastolic pressure: below 80. Elevated  Systolic pressure: 120-129.  Diastolic pressure: below 80. Hypertension stage 1  Systolic pressure: 130-139.  Diastolic pressure: 80-89. Hypertension stage 2  Systolic pressure: 140 or above.  Diastolic pressure: 90 or above. What health risks are associated with hypertension? Managing your hypertension is an important responsibility. Uncontrolled hypertension can lead to:  A heart attack.  A stroke.  A weakened blood vessel (aneurysm).  Heart failure.  Kidney damage.  Eye damage.  Metabolic syndrome.  Memory and concentration problems.  What changes can I make to manage my  hypertension? Hypertension can be managed by making lifestyle changes and possibly by taking medicines. Your health care provider will help you make a plan to bring your blood pressure within a normal range. Eating and drinking  Eat a diet that is high in fiber and potassium, and low in salt (sodium), added sugar, and fat. An example eating plan is called the DASH (Dietary Approaches to Stop Hypertension) diet. To eat this way: ? Eat plenty of fresh fruits and vegetables. Try to fill half of your plate at each meal with fruits and vegetables. ? Eat whole grains, such as whole wheat pasta, brown rice, or whole grain bread. Fill about one quarter of your plate with whole grains. ? Eat low-fat diary products. ? Avoid fatty cuts of meat, processed or cured meats, and poultry with skin. Fill about one quarter of your plate with lean proteins such as fish, chicken without skin, beans, eggs, and tofu. ? Avoid premade and processed foods. These tend to be higher in sodium, added sugar, and fat.  Reduce your daily sodium intake. Most people with hypertension should eat less than 1,500 mg of sodium a day.  Limit alcohol intake to no more than 1 drink a day for nonpregnant women and 2 drinks a day for men. One drink equals 12 oz of beer, 5 oz of wine, or 1 oz of hard liquor. Lifestyle  Work with your health care provider to maintain a healthy body weight, or to lose weight. Ask what an ideal weight is for you.  Get at least 30 minutes of exercise that causes your heart to beat faster (aerobic exercise) most days of the week. Activities may include walking, swimming, or biking.  Include exercise   to strengthen your muscles (resistance exercise), such as weight lifting, as part of your weekly exercise routine. Try to do these types of exercises for 30 minutes at least 3 days a week.  Do not use any products that contain nicotine or tobacco, such as cigarettes and e-cigarettes. If you need help quitting, ask  your health care provider.  Control any long-term (chronic) conditions you have, such as high cholesterol or diabetes. Monitoring  Monitor your blood pressure at home as told by your health care provider. Your personal target blood pressure may vary depending on your medical conditions, your age, and other factors.  Have your blood pressure checked regularly, as often as told by your health care provider. Working with your health care provider  Review all the medicines you take with your health care provider because there may be side effects or interactions.  Talk with your health care provider about your diet, exercise habits, and other lifestyle factors that may be contributing to hypertension.  Visit your health care provider regularly. Your health care provider can help you create and adjust your plan for managing hypertension. Will I need medicine to control my blood pressure? Your health care provider may prescribe medicine if lifestyle changes are not enough to get your blood pressure under control, and if:  Your systolic blood pressure is 130 or higher.  Your diastolic blood pressure is 80 or higher.  Take medicines only as told by your health care provider. Follow the directions carefully. Blood pressure medicines must be taken as prescribed. The medicine does not work as well when you skip doses. Skipping doses also puts you at risk for problems. Contact a health care provider if:  You think you are having a reaction to medicines you have taken.  You have repeated (recurrent) headaches.  You feel dizzy.  You have swelling in your ankles.  You have trouble with your vision. Get help right away if:  You develop a severe headache or confusion.  You have unusual weakness or numbness, or you feel faint.  You have severe pain in your chest or abdomen.  You vomit repeatedly.  You have trouble breathing. Summary  Hypertension is when the force of blood pumping through  your arteries is too strong. If this condition is not controlled, it may put you at risk for serious complications.  Your personal target blood pressure may vary depending on your medical conditions, your age, and other factors. For most people, a normal blood pressure is less than 120/80.  Hypertension is managed by lifestyle changes, medicines, or both. Lifestyle changes include weight loss, eating a healthy, low-sodium diet, exercising more, and limiting alcohol. This information is not intended to replace advice given to you by your health care provider. Make sure you discuss any questions you have with your health care provider. Document Released: 03/13/2012 Document Revised: 05/17/2016 Document Reviewed: 05/17/2016 Elsevier Interactive Patient Education  2018 Elsevier Inc.  

## 2017-02-07 NOTE — Progress Notes (Signed)
Patient is here for f/up   Patient has taking his med for today

## 2017-02-07 NOTE — Progress Notes (Signed)
Subjective:  Patient ID: [name], male    DOB: [birth year]  Age: 36 y.o. MRN: [id number]  CC: Hypertension   HPI [name] presents for hypertension follow up. Recent history of follow up visit with rehab physician were antihypertensive medications were adjusted. Patient had complaints of waking up to urinate at night with HTCZ use. He is exercising and is adherent to low salt diet. He does not check BP at home. Cardiac symptoms none. Patient denies chest pain, chest pressure/discomfort, claudication, dyspnea, lower extremity edema, near-syncope and palpitations.  Cardiovascular risk factors: hypertension and male gender. He denies being a current smoker. Use of agents associated with hypertension: none. History of target organ damage: none. He and his significant other reports patient sleep has improved. Reports getting around 7 to 8 hours per night of sleep. He is adherent with all current medication use. Reports recent history of opthalmology exam where he was told to follow up in October due to IOP behind the right eye.    Outpatient Medications Prior to Visit  Medication Sig Dispense Refill  . albuterol (PROVENTIL HFA;VENTOLIN HFA) 108 (90 Base) MCG/ACT inhaler Inhale 2 puffs into the lungs every 4 (four) hours as needed for wheezing or shortness of breath. 1 Inhaler 2  . ibuprofen (ADVIL,MOTRIN) 600 MG tablet Take 1 tablet (600 mg total) by mouth every 8 (eight) hours as needed for moderate pain or cramping. 30 tablet 0  . lisinopril (PRINIVIL) 10 MG tablet Take 1 tablet (10 mg total) by mouth daily. 30 tablet 3  . polycarbophil (FIBERCON) 625 MG tablet Take 1 tablet (625 mg total) by mouth daily. 30 tablet 0  . traZODone (DESYREL) 100 MG tablet Take 1-2 tablets (100-200 mg total) by mouth at bedtime. 60 tablet 4  . amantadine (SYMMETREL) 100 MG capsule Take 1 capsule (100 mg total) by mouth 2 (two) times daily. Take upon waking and then with lunch 60 capsule 3  . metoprolol  (LOPRESSOR) 100 MG tablet Take 1 tablet (100 mg total) by mouth 2 (two) times daily. 60 tablet 5   No facility-administered medications prior to visit.     ROS Review of Systems  Constitutional: Negative.   Eyes: Negative.   Respiratory: Negative.   Cardiovascular: Negative.   Gastrointestinal: Negative.   Skin: Negative.   Neurological: Negative.   Psychiatric/Behavioral: Negative.     Objective:  BP 127/81   Pulse 62   Temp 98.4 F (36.9 C) (Oral)   Resp 18   Ht 6\' 2"  (1.88 m)   Wt 248 lb 12.8 oz (112.9 kg)   SpO2 94%   BMI 31.94 kg/m   BP/Weight 02/14/2017 02/11/2017 02/07/2017  Systolic BP 141 146 127  Diastolic BP 85 77 81  Wt. (Lbs) - - 248.8  BMI - - 31.94     Physical Exam  Constitutional: He is oriented to person, place, and time. He appears well-developed and well-nourished.  Eyes: Pupils are equal, round, and reactive to light. Conjunctivae and EOM are normal.  Neck: No JVD present.  Cardiovascular: Normal rate, regular rhythm, normal heart sounds and intact distal pulses.   Pulmonary/Chest: Effort normal and breath sounds normal.  Abdominal: Soft. Bowel sounds are normal.  Neurological: He is alert and oriented to person, place, and time.  Skin: Skin is warm and dry.  Psychiatric: He has a normal mood and affect.  Nursing note and vitals reviewed.   Assessment & Plan:   Problem List Items Addressed This Visit  Cardiovascular and Mediastinum   Essential hypertension - Primary   BP well-controlled on current medication .   Relevant Medications   metoprolol tartrate (LOPRESSOR) 100 MG tablet      Meds ordered this encounter  Medications  . metoprolol tartrate (LOPRESSOR) 100 MG tablet    Sig: Take 1 tablet (100 mg total) by mouth 2 (two) times daily.    Dispense:  60 tablet    Refill:  5    Order Specific Question:   Supervising Provider    Answer:   Quentin AngstJEGEDE, OLUGBEMIGA E [id number]]    Follow-up: Return in about 3 months (around  05/10/2017) for HTN.   Lizbeth BarkMandesia R Zackory Pudlo FNP

## 2017-02-10 ENCOUNTER — Emergency Department (HOSPITAL_COMMUNITY): Payer: Medicaid Other

## 2017-02-10 ENCOUNTER — Emergency Department (HOSPITAL_COMMUNITY)
Admission: EM | Admit: 2017-02-10 | Discharge: 2017-02-11 | Disposition: A | Discharge status: Home / Self Care | Payer: Medicaid Other | Attending: Emergency Medicine | Admitting: Emergency Medicine

## 2017-02-10 DIAGNOSIS — Z79899 Other long term (current) drug therapy: Secondary | ICD-10-CM | POA: Diagnosis not present

## 2017-02-10 DIAGNOSIS — J45909 Unspecified asthma, uncomplicated: Secondary | ICD-10-CM | POA: Diagnosis not present

## 2017-02-10 DIAGNOSIS — Z87820 Personal history of traumatic brain injury: Secondary | ICD-10-CM | POA: Insufficient documentation

## 2017-02-10 DIAGNOSIS — I10 Essential (primary) hypertension: Secondary | ICD-10-CM | POA: Diagnosis not present

## 2017-02-10 DIAGNOSIS — R569 Unspecified convulsions: Secondary | ICD-10-CM | POA: Insufficient documentation
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
## 2017-02-10 NOTE — ED Triage Notes (Signed)
Patient arrives from home via EMS where he had a seizure tonight. Family stated that patient was in adjacent room watching TV. Family then heard noise and went to check on patient. Found patient stiffened and unresponsive. Lower extremities shaking at that time. History of TBI in January but never had a seizure, was on Keppra through Feb as precaution per EMS. EMS reported that HR for patient was initially 180 in [place]. Patient became combative for a short time as he began to wake. EMS then noted HR converted to NSR @ 80. Seizure lasted approximately 3-705mins. 10 Mins of unresponsiveness after.

## 2017-02-11 LAB — URINALYSIS, ROUTINE W REFLEX MICROSCOPIC
BACTERIA UA: NONE SEEN
BILIRUBIN URINE: NEGATIVE
Glucose, UA: NEGATIVE mg/dL
Ketones, ur: NEGATIVE mg/dL
LEUKOCYTES UA: NEGATIVE
Nitrite: NEGATIVE
Protein, ur: 100 mg/dL — AB
RBC / HPF: NONE SEEN RBC/hpf (ref 0–5)
SQUAMOUS EPITHELIAL / LPF: NONE SEEN
Specific Gravity, Urine: 1.018 (ref 1.005–1.030)
pH: 5 (ref 5.0–8.0)

## 2017-02-11 LAB — CBC WITH DIFFERENTIAL/PLATELET
BASOS ABS: 0 10*3/uL (ref 0.0–0.1)
Basophils Relative: 0 %
EOS PCT: 0 %
Eosinophils Absolute: 0 10*3/uL (ref 0.0–0.7)
HEMATOCRIT: 42.1 % (ref 39.0–52.0)
Hemoglobin: 14.4 g/dL (ref 13.0–17.0)
LYMPHS PCT: 13 %
Lymphs Abs: 1.3 10*3/uL (ref 0.7–4.0)
MCH: 32.1 pg (ref 26.0–34.0)
MCHC: 34.2 g/dL (ref 30.0–36.0)
MCV: 93.8 fL (ref 78.0–100.0)
MONOS PCT: 9 %
Monocytes Absolute: 0.8 10*3/uL (ref 0.1–1.0)
Neutro Abs: 7.6 10*3/uL (ref 1.7–7.7)
Neutrophils Relative %: 78 %
Platelets: 180 10*3/uL (ref 150–400)
RBC: 4.49 MIL/uL (ref 4.22–5.81)
RDW: 12 % (ref 11.5–15.5)
WBC: 9.8 10*3/uL (ref 4.0–10.5)

## 2017-02-11 LAB — RAPID URINE DRUG SCREEN, HOSP PERFORMED
Amphetamines: NOT DETECTED
Barbiturates: NOT DETECTED
Benzodiazepines: NOT DETECTED
COCAINE: NOT DETECTED
OPIATES: NOT DETECTED
TETRAHYDROCANNABINOL: POSITIVE — AB

## 2017-02-11 LAB — COMPREHENSIVE METABOLIC PANEL
ALBUMIN: 3.7 g/dL (ref 3.5–5.0)
ALT: 28 U/L (ref 17–63)
AST: 22 U/L (ref 15–41)
Alkaline Phosphatase: 73 U/L (ref 38–126)
Anion gap: 8 (ref 5–15)
BILIRUBIN TOTAL: 0.6 mg/dL (ref 0.3–1.2)
BUN: 9 mg/dL (ref 6–20)
CALCIUM: 9.1 mg/dL (ref 8.9–10.3)
CO2: 24 mmol/L (ref 22–32)
Chloride: 106 mmol/L (ref 101–111)
Creatinine, Ser: 1.32 mg/dL — ABNORMAL HIGH (ref 0.61–1.24)
GFR calc Af Amer: >60 mL/min (ref >60)
GFR calc non Af Amer: >60 mL/min (ref >60)
GLUCOSE: 135 mg/dL — AB (ref 65–99)
POTASSIUM: 3.5 mmol/L (ref 3.5–5.1)
Sodium: 138 mmol/L (ref 135–145)
TOTAL PROTEIN: 6.9 g/dL (ref 6.5–8.1)

## 2017-02-11 MED ORDER — LEVETIRACETAM 500 MG PO TABS: 500.0000 mg | ORAL_TABLET | Freq: Two times a day (BID) | ORAL | 0 refills | Status: DC

## 2017-02-11 NOTE — Discharge Instructions (Signed)
Start Keppra twice daily. Call [facility] to schedule a time to be seen for further outpatient management.

## 2017-02-14 ENCOUNTER — Encounter: Payer: Medicaid Other | Attending: Physical Medicine & Rehabilitation | Admitting: Physical Medicine & Rehabilitation

## 2017-02-14 ENCOUNTER — Encounter: Payer: Self-pay | Admitting: Physical Medicine & Rehabilitation

## 2017-02-14 VITALS — BP 141/85 | HR 57

## 2017-02-14 DIAGNOSIS — F1210 Cannabis abuse, uncomplicated: Secondary | ICD-10-CM | POA: Insufficient documentation

## 2017-02-14 DIAGNOSIS — F1010 Alcohol abuse, uncomplicated: Secondary | ICD-10-CM | POA: Diagnosis not present

## 2017-02-14 DIAGNOSIS — R451 Restlessness and agitation: Secondary | ICD-10-CM | POA: Diagnosis not present

## 2017-02-14 DIAGNOSIS — S062X3S Diffuse traumatic brain injury with loss of consciousness of 1 hour to 5 hours 59 minutes, sequela: Secondary | ICD-10-CM | POA: Diagnosis not present

## 2017-02-14 DIAGNOSIS — S06309A Unspecified focal traumatic brain injury with loss of consciousness of unspecified duration, initial encounter: Secondary | ICD-10-CM | POA: Diagnosis not present

## 2017-02-14 DIAGNOSIS — Z298 Encounter for other specified prophylactic measures: Secondary | ICD-10-CM

## 2017-02-14 DIAGNOSIS — J45909 Unspecified asthma, uncomplicated: Secondary | ICD-10-CM | POA: Insufficient documentation

## 2017-02-14 DIAGNOSIS — I10 Essential (primary) hypertension: Secondary | ICD-10-CM | POA: Diagnosis not present

## 2017-02-14 MED ORDER — AMANTADINE HCL 100 MG PO CAPS: 100.0000 mg | ORAL_CAPSULE | Freq: Every day | ORAL | 3 refills | Status: DC

## 2017-02-14 NOTE — Patient Instructions (Signed)
PLEASE FEEL FREE TO CALL OUR OFFICE WITH ANY PROBLEMS OR QUESTIONS ([phone])      
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
## 2017-03-09 MED FILL — traZODone HCL 100 MG TABS: 100 | 30 days supply | Qty: 60 | Fill #4

## 2017-03-13 ENCOUNTER — Telehealth: Payer: Self-pay | Admitting: *Deleted

## 2017-03-13 MED ORDER — LEVETIRACETAM 500 MG PO TABS: 500.0000 mg | ORAL_TABLET | Freq: Two times a day (BID) | ORAL | 4 refills | Status: DC

## 2017-03-13 NOTE — Telephone Encounter (Signed)
keppra refilled

## 2017-03-13 NOTE — Telephone Encounter (Signed)
[name] called for a refill on keppra.  We did not fill this. Do you want to refill? . His appt is 03/20/17 @ 9:40

## 2017-03-20 ENCOUNTER — Encounter: Payer: Medicaid Other | Attending: Physical Medicine & Rehabilitation | Admitting: Physical Medicine & Rehabilitation

## 2017-03-20 ENCOUNTER — Encounter: Payer: Self-pay | Admitting: Physical Medicine & Rehabilitation

## 2017-03-20 VITALS — BP 138/92 | HR 78 | Resp 14

## 2017-03-20 DIAGNOSIS — I10 Essential (primary) hypertension: Secondary | ICD-10-CM | POA: Insufficient documentation

## 2017-03-20 DIAGNOSIS — F1010 Alcohol abuse, uncomplicated: Secondary | ICD-10-CM | POA: Insufficient documentation

## 2017-03-20 DIAGNOSIS — F068 Other specified mental disorders due to known physiological condition: Secondary | ICD-10-CM | POA: Diagnosis not present

## 2017-03-20 DIAGNOSIS — S062X3S Diffuse traumatic brain injury with loss of consciousness of 1 hour to 5 hours 59 minutes, sequela: Secondary | ICD-10-CM | POA: Diagnosis not present

## 2017-03-20 DIAGNOSIS — S06309A Unspecified focal traumatic brain injury with loss of consciousness of unspecified duration, initial encounter: Secondary | ICD-10-CM | POA: Insufficient documentation

## 2017-03-20 DIAGNOSIS — J45909 Unspecified asthma, uncomplicated: Secondary | ICD-10-CM | POA: Diagnosis not present

## 2017-03-20 DIAGNOSIS — F1210 Cannabis abuse, uncomplicated: Secondary | ICD-10-CM | POA: Insufficient documentation

## 2017-03-20 DIAGNOSIS — S069X0S Unspecified intracranial injury without loss of consciousness, sequela: Secondary | ICD-10-CM
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
## 2017-03-20 NOTE — Patient Instructions (Signed)
STOP AMANTADINE!!!  

## 2017-03-22 ENCOUNTER — Encounter (HOSPITAL_BASED_OUTPATIENT_CLINIC_OR_DEPARTMENT_OTHER): Payer: Medicaid Other | Admitting: Psychology

## 2017-03-22 ENCOUNTER — Encounter: Payer: Self-pay | Admitting: Psychology

## 2017-03-22 DIAGNOSIS — S069X0S Unspecified intracranial injury without loss of consciousness, sequela: Secondary | ICD-10-CM

## 2017-03-22 DIAGNOSIS — S06309A Unspecified focal traumatic brain injury with loss of consciousness of unspecified duration, initial encounter: Secondary | ICD-10-CM | POA: Diagnosis not present

## 2017-03-22 DIAGNOSIS — Z298 Encounter for other specified prophylactic measures: Secondary | ICD-10-CM

## 2017-03-22 DIAGNOSIS — S062X3S Diffuse traumatic brain injury with loss of consciousness of 1 hour to 5 hours 59 minutes, sequela: Secondary | ICD-10-CM | POA: Diagnosis not present

## 2017-03-22 DIAGNOSIS — R4189 Other symptoms and signs involving cognitive functions and awareness: Secondary | ICD-10-CM

## 2017-03-22 DIAGNOSIS — F068 Other specified mental disorders due to known physiological condition: Secondary | ICD-10-CM | POA: Diagnosis not present

## 2017-03-22 DIAGNOSIS — R451 Restlessness and agitation: Secondary | ICD-10-CM | POA: Diagnosis not present

## 2017-03-22 DIAGNOSIS — Z2989 Encounter for other specified prophylactic measures: Secondary | ICD-10-CM
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
## 2017-03-27 ENCOUNTER — Encounter: Payer: Self-pay | Admitting: Neurology

## 2017-03-27 ENCOUNTER — Ambulatory Visit (INDEPENDENT_AMBULATORY_CARE_PROVIDER_SITE_OTHER): Payer: Medicaid Other | Admitting: Neurology

## 2017-03-27 VITALS — BP 140/86 | HR 55 | Ht 74.0 in | Wt 247.5 lb

## 2017-03-27 DIAGNOSIS — F068 Other specified mental disorders due to known physiological condition: Secondary | ICD-10-CM

## 2017-03-27 DIAGNOSIS — S069X0S Unspecified intracranial injury without loss of consciousness, sequela: Secondary | ICD-10-CM

## 2017-03-27 DIAGNOSIS — R569 Unspecified convulsions: Secondary | ICD-10-CM | POA: Insufficient documentation

## 2017-03-27 DIAGNOSIS — S062X3S Diffuse traumatic brain injury with loss of consciousness of 1 hour to 5 hours 59 minutes, sequela: Secondary | ICD-10-CM

## 2017-03-27 MED ORDER — LAMOTRIGINE 100 MG PO TABS: 100.0000 mg | ORAL_TABLET | Freq: Two times a day (BID) | ORAL | 4 refills | Status: DC

## 2017-03-27 MED ORDER — LAMOTRIGINE 25 MG PO TABS: ORAL_TABLET | ORAL | 0 refills | Status: DC
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
## 2017-03-27 NOTE — Patient Instructions (Signed)
No driving until seizure-free for 6 months 

## 2017-04-04 ENCOUNTER — Ambulatory Visit (INDEPENDENT_AMBULATORY_CARE_PROVIDER_SITE_OTHER): Payer: Medicaid Other | Admitting: Neurology

## 2017-04-04 DIAGNOSIS — F068 Other specified mental disorders due to known physiological condition: Secondary | ICD-10-CM

## 2017-04-04 DIAGNOSIS — S069X0S Unspecified intracranial injury without loss of consciousness, sequela: Secondary | ICD-10-CM

## 2017-04-04 DIAGNOSIS — S062X3S Diffuse traumatic brain injury with loss of consciousness of 1 hour to 5 hours 59 minutes, sequela: Secondary | ICD-10-CM

## 2017-04-04 DIAGNOSIS — R569 Unspecified convulsions: Secondary | ICD-10-CM | POA: Diagnosis not present

## 2017-04-06 ENCOUNTER — Telehealth: Payer: Self-pay

## 2017-04-06 NOTE — Telephone Encounter (Signed)
Great, thanks

## 2017-04-06 NOTE — Procedures (Signed)
   HISTORY: 36 years old male, with history of seizure  TECHNIQUE:  16 channel EEG was performed based on standard 10-16 international system. One channel was dedicated to EKG, which has demonstrates normal sinus rhythm of 66 beats per minutes.  Upon awakening, the posterior background activity was well-developed, in alpha range, reactive to eye opening and closure.  There was no evidence of epileptiform discharge.  Photic stimulation was performed, which induced a symmetric photic driving.  Hyperventilation was performed, there was no abnormality elicit.  No sleep was achieved.  CONCLUSION: This is a  normal awake EEG.  There is no electrodiagnostic evidence of epileptiform discharge.  Levert Feinstein, M.D. Ph.D.  [facility] [address] [place], [place] [address] Phone: [phone] Fax:      [phone]

## 2017-04-06 NOTE — Telephone Encounter (Signed)
Pt returned CMA's call. Msg relayed EEG was normal. Pt did not have any questions.

## 2017-04-06 NOTE — Telephone Encounter (Signed)
-----   Message from Levert Feinstein, MD sent at 04/06/2017  9:06 AM EDT ----- Please call patient for normal eeg

## 2017-04-06 NOTE — Telephone Encounter (Signed)
I called and left a message for patient to call back. His EEG results are normal.

## 2017-04-12 ENCOUNTER — Other Ambulatory Visit: Payer: Self-pay | Admitting: Physical Medicine & Rehabilitation

## 2017-04-12 DIAGNOSIS — G4701 Insomnia due to medical condition: Secondary | ICD-10-CM

## 2017-04-12 DIAGNOSIS — S069XAS Unspecified intracranial injury with loss of consciousness status unknown, sequela: Secondary | ICD-10-CM

## 2017-04-12 DIAGNOSIS — G479 Sleep disorder, unspecified: Secondary | ICD-10-CM

## 2017-04-12 DIAGNOSIS — S062X3S Diffuse traumatic brain injury with loss of consciousness of 1 hour to 5 hours 59 minutes, sequela: Secondary | ICD-10-CM

## 2017-04-12 DIAGNOSIS — G894 Chronic pain syndrome: Secondary | ICD-10-CM

## 2017-04-12 DIAGNOSIS — S069X0S Unspecified intracranial injury without loss of consciousness, sequela: Secondary | ICD-10-CM

## 2017-04-12 DIAGNOSIS — Z5181 Encounter for therapeutic drug level monitoring: Secondary | ICD-10-CM

## 2017-04-12 DIAGNOSIS — Z79899 Other long term (current) drug therapy: Secondary | ICD-10-CM

## 2017-04-12 DIAGNOSIS — F068 Other specified mental disorders due to known physiological condition: Secondary | ICD-10-CM

## 2017-04-12 NOTE — Telephone Encounter (Signed)
Recieved electronic medication refill request for trazadone according to last note:  Neuropsych:  - improving behavior but still a little disinhibited at times with girlfriend. There are pre-morbid behaviors also -amantadine for arousal/energy during the day--discontinue -continue with schedule/structure -continue with environmental cues/mod -await neuro-psych testing to help [place] determine vocational abilities/cognitive level   Unsure if ok to refill this medication as it is an antidepressant   Please advise

## 2017-04-16 ENCOUNTER — Telehealth: Payer: Self-pay

## 2017-04-16 ENCOUNTER — Ambulatory Visit
Admission: RE | Admit: 2017-04-16 | Discharge: 2017-04-16 | Disposition: A | Discharge status: Home / Self Care | Payer: Medicaid Other | Source: Ambulatory Visit | Attending: Neurology | Admitting: Neurology

## 2017-04-16 DIAGNOSIS — G894 Chronic pain syndrome: Secondary | ICD-10-CM

## 2017-04-16 DIAGNOSIS — Z79899 Other long term (current) drug therapy: Secondary | ICD-10-CM

## 2017-04-16 DIAGNOSIS — S069X0S Unspecified intracranial injury without loss of consciousness, sequela: Secondary | ICD-10-CM

## 2017-04-16 DIAGNOSIS — F068 Other specified mental disorders due to known physiological condition: Secondary | ICD-10-CM

## 2017-04-16 DIAGNOSIS — R4189 Other symptoms and signs involving cognitive functions and awareness: Secondary | ICD-10-CM

## 2017-04-16 DIAGNOSIS — S062X3S Diffuse traumatic brain injury with loss of consciousness of 1 hour to 5 hours 59 minutes, sequela: Secondary | ICD-10-CM

## 2017-04-16 DIAGNOSIS — G479 Sleep disorder, unspecified: Secondary | ICD-10-CM

## 2017-04-16 DIAGNOSIS — R569 Unspecified convulsions: Secondary | ICD-10-CM

## 2017-04-16 DIAGNOSIS — Z5181 Encounter for therapeutic drug level monitoring: Secondary | ICD-10-CM

## 2017-04-16 DIAGNOSIS — G4701 Insomnia due to medical condition: Secondary | ICD-10-CM

## 2017-04-16 MED ORDER — TRAZODONE HCL 100 MG PO TABS: 100.0000 mg | ORAL_TABLET | Freq: Every day | ORAL | 4 refills | Status: DC

## 2017-04-16 NOTE — Telephone Encounter (Signed)
Notified.  [facility].

## 2017-04-16 NOTE — Telephone Encounter (Signed)
I refilled the medication

## 2017-04-16 NOTE — Telephone Encounter (Addendum)
Patient called requesting refill for trazodone.  Last filled April with 4 refills, Do you want to continue this medication?

## 2017-05-01 ENCOUNTER — Other Ambulatory Visit: Payer: Self-pay | Admitting: Family Medicine

## 2017-05-01 ENCOUNTER — Encounter: Payer: Medicaid Other | Attending: Physical Medicine & Rehabilitation | Admitting: Physical Medicine & Rehabilitation

## 2017-05-01 ENCOUNTER — Encounter: Payer: Self-pay | Admitting: Physical Medicine & Rehabilitation

## 2017-05-01 VITALS — BP 147/91 | HR 65

## 2017-05-01 DIAGNOSIS — S06309A Unspecified focal traumatic brain injury with loss of consciousness of unspecified duration, initial encounter: Secondary | ICD-10-CM | POA: Insufficient documentation

## 2017-05-01 DIAGNOSIS — J45909 Unspecified asthma, uncomplicated: Secondary | ICD-10-CM | POA: Insufficient documentation

## 2017-05-01 DIAGNOSIS — S069X0S Unspecified intracranial injury without loss of consciousness, sequela: Secondary | ICD-10-CM | POA: Diagnosis not present

## 2017-05-01 DIAGNOSIS — F1210 Cannabis abuse, uncomplicated: Secondary | ICD-10-CM | POA: Diagnosis not present

## 2017-05-01 DIAGNOSIS — I10 Essential (primary) hypertension: Secondary | ICD-10-CM | POA: Insufficient documentation

## 2017-05-01 DIAGNOSIS — R569 Unspecified convulsions: Secondary | ICD-10-CM

## 2017-05-01 DIAGNOSIS — F1010 Alcohol abuse, uncomplicated: Secondary | ICD-10-CM | POA: Insufficient documentation

## 2017-05-01 DIAGNOSIS — M791 Myalgia, unspecified site: Secondary | ICD-10-CM

## 2017-05-01 DIAGNOSIS — F068 Other specified mental disorders due to known physiological condition: Secondary | ICD-10-CM | POA: Diagnosis not present

## 2017-05-01 DIAGNOSIS — G479 Sleep disorder, unspecified: Secondary | ICD-10-CM

## 2017-05-01 MED ORDER — AMANTADINE HCL 100 MG PO CAPS: 100.0000 mg | ORAL_CAPSULE | Freq: Every day | ORAL | 5 refills | Status: DC

## 2017-05-01 NOTE — Patient Instructions (Signed)
CONTINUE TO USE ICE AND IBUPROFEN FOR YOUR LEFT KNEE. BACK OFF YOUR WALKING A BIT IF YOUR KNEE CONTINUES TO ACT UP
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
## 2017-05-03 ENCOUNTER — Other Ambulatory Visit: Payer: Self-pay

## 2017-05-03 NOTE — Telephone Encounter (Signed)
According to last note:  6. Left knee pain: likely mild LCL sprain           -recommend decreased walking for the next couple weeks           -conitnue ice, more liberal ibuprofen for now .  Motrin never ordered before from this clinic, should we order this for him, also last refill for him occurred on 05/01/2017 by a Dr. Jenelle MagesHairston.  Please advise

## 2017-05-03 NOTE — Telephone Encounter (Signed)
Patient called wanting refill on his ibuprofen and stated that his knee is hurting.

## 2017-05-04 NOTE — Telephone Encounter (Signed)
He can purchase this OTC as he was before. 400-600 q8 prn

## 2017-05-07 NOTE — Telephone Encounter (Signed)
Notified. 

## 2017-05-10 ENCOUNTER — Encounter: Payer: Self-pay | Admitting: Family Medicine

## 2017-05-10 ENCOUNTER — Ambulatory Visit: Payer: Medicaid Other | Attending: Family Medicine | Admitting: Family Medicine

## 2017-05-10 VITALS — BP 129/78 | HR 54 | Temp 98.7°F | Resp 18 | Ht 74.0 in | Wt 241.8 lb

## 2017-05-10 DIAGNOSIS — M25562 Pain in left knee: Secondary | ICD-10-CM | POA: Insufficient documentation

## 2017-05-10 DIAGNOSIS — Z09 Encounter for follow-up examination after completed treatment for conditions other than malignant neoplasm: Secondary | ICD-10-CM

## 2017-05-10 DIAGNOSIS — Z79899 Other long term (current) drug therapy: Secondary | ICD-10-CM | POA: Insufficient documentation

## 2017-05-10 DIAGNOSIS — I10 Essential (primary) hypertension: Secondary | ICD-10-CM

## 2017-05-10 DIAGNOSIS — E785 Hyperlipidemia, unspecified: Secondary | ICD-10-CM | POA: Diagnosis not present

## 2017-05-10 MED ORDER — LISINOPRIL 10 MG PO TABS: 10.0000 mg | ORAL_TABLET | Freq: Every day | ORAL | 3 refills | Status: DC

## 2017-05-10 MED ORDER — MELATONIN 2.5 MG PO CHEW: CHEWABLE_TABLET | ORAL | Status: AC

## 2017-05-10 MED ORDER — METOPROLOL TARTRATE 100 MG PO TABS: 100.0000 mg | ORAL_TABLET | Freq: Two times a day (BID) | ORAL | 3 refills | Status: DC

## 2017-05-10 NOTE — Progress Notes (Deleted)
Subjective:  Patient ID: [name], male    DOB: 1981-04-29  Age: 36 y.o. MRN: [id number]  CC: Hypertension   HPI [name] presents for     Outpatient Medications Prior to Visit  Medication Sig Dispense Refill  . albuterol (PROVENTIL HFA;VENTOLIN HFA) 108 (90 Base) MCG/ACT inhaler Inhale 2 puffs into the lungs every 4 (four) hours as needed for wheezing or shortness of breath. 1 Inhaler 2  . amantadine (SYMMETREL) 100 MG capsule Take 1 capsule (100 mg total) by mouth daily. 30 capsule 5  . ibuprofen (ADVIL,MOTRIN) 600 MG tablet TAKE 1 TABLET BY MOUTH EVERY 8 HOURS AS NEEDED FOR MODERATE PAIN OR CRAMPING. 30 tablet 0  . lamoTRIgine (LAMICTAL) 100 MG tablet Take 1 tablet (100 mg total) by mouth 2 (two) times daily. 180 tablet 4  . polycarbophil (FIBERCON) 625 MG tablet Take 1 tablet (625 mg total) by mouth daily. 30 tablet 0  . traZODone (DESYREL) 100 MG tablet Take 1-2 tablets (100-200 mg total) by mouth at bedtime. 60 tablet 4  . lisinopril (PRINIVIL) 10 MG tablet Take 1 tablet (10 mg total) by mouth daily. 30 tablet 3  . metoprolol tartrate (LOPRESSOR) 100 MG tablet Take 1 tablet (100 mg total) by mouth 2 (two) times daily. 60 tablet 5   No facility-administered medications prior to visit.     ROS Review of Systems  Constitutional: Negative.   Eyes: Negative.   Respiratory: Negative.   Cardiovascular: Negative.   Gastrointestinal: Negative.   Skin: Negative.   Neurological: Negative.   Psychiatric/Behavioral: Negative.     Objective:  BP 129/78   Pulse (!) 54   Temp 98.7 F (37.1 C) (Oral)   Resp 18   Ht 6\' 2"  (1.88 m)   Wt 241 lb 12.8 oz (109.7 kg)   SpO2 96%   BMI 31.05 kg/m   BP/Weight 05/10/2017 05/01/2017 03/27/2017  Systolic BP 129 147 140  Diastolic BP 78 91 86  Wt. (Lbs) 241.8 - 247.5  BMI 31.05 - 31.78     Physical Exam  Constitutional: He is oriented to person, place, and time. He appears well-developed and well-nourished.  Eyes:  Pupils are equal, round, and reactive to light. Conjunctivae and EOM are normal.  Neck: No JVD present.  Cardiovascular: Normal rate, regular rhythm, normal heart sounds and intact distal pulses.   Pulmonary/Chest: Effort normal and breath sounds normal.  Abdominal: Soft. Bowel sounds are normal.  Neurological: He is alert and oriented to person, place, and time.  Skin: Skin is warm and dry.  Psychiatric: He has a normal mood and affect.  Nursing note and vitals reviewed.   Assessment & Plan:   Problem List Items Addressed This Visit      Cardiovascular and Mediastinum   Essential hypertension - Primary   BP well-controlled on current medication .   Relevant Medications   metoprolol tartrate (LOPRESSOR) 100 MG tablet      Meds ordered this encounter  Medications  . Melatonin 2.5 MG CHEW    Sig: Use as directed for as needed for sleep.    Order Specific Question:   Supervising Provider    Answer:   Quentin AngstJEGEDE, OLUGBEMIGA E [id number]]  . lisinopril (PRINIVIL) 10 MG tablet    Sig: Take 1 tablet (10 mg total) daily by mouth.    Dispense:  90 tablet    Refill:  3    Order Specific Question:   Supervising Provider    Answer:  JEGEDE, OLUGBEMIGA E [id number]]  . metoprolol tartrate (LOPRESSOR) 100 MG tablet    Sig: Take 1 tablet (100 mg total) 2 (two) times daily by mouth.    Dispense:  90 tablet    Refill:  3    Order Specific Question:   Supervising Provider    Answer:   Quentin AngstJEGEDE, OLUGBEMIGA E [id number]]    Follow-up: Return in about 6 months (around 11/07/2017), or if symptoms worsen or fail to improve.   Lizbeth BarkMandesia R Tinsley Everman FNP

## 2017-05-10 NOTE — Progress Notes (Signed)
Patient is here for f/up HTN   Patient complains left knee pain that comes & goes

## 2017-05-10 NOTE — Patient Instructions (Signed)

## 2017-05-11 LAB — LIPID PANEL
Chol/HDL Ratio: 4.2 ratio (ref 0.0–5.0)
Cholesterol, Total: 159 mg/dL (ref 100–199)
HDL: 38 mg/dL — AB (ref >39)
LDL CALC: 98 mg/dL (ref 0–99)
Triglycerides: 117 mg/dL (ref 0–149)
VLDL CHOLESTEROL CAL: 23 mg/dL (ref 5–40)

## 2017-05-15 ENCOUNTER — Encounter: Payer: Medicaid Other | Attending: Physical Medicine & Rehabilitation | Admitting: Psychology

## 2017-05-15 DIAGNOSIS — R451 Restlessness and agitation: Secondary | ICD-10-CM | POA: Diagnosis not present

## 2017-05-15 DIAGNOSIS — S06309A Unspecified focal traumatic brain injury with loss of consciousness of unspecified duration, initial encounter: Secondary | ICD-10-CM | POA: Insufficient documentation

## 2017-05-15 DIAGNOSIS — F068 Other specified mental disorders due to known physiological condition: Secondary | ICD-10-CM | POA: Diagnosis not present

## 2017-05-15 DIAGNOSIS — F1210 Cannabis abuse, uncomplicated: Secondary | ICD-10-CM | POA: Diagnosis not present

## 2017-05-15 DIAGNOSIS — J45909 Unspecified asthma, uncomplicated: Secondary | ICD-10-CM | POA: Diagnosis not present

## 2017-05-15 DIAGNOSIS — F1010 Alcohol abuse, uncomplicated: Secondary | ICD-10-CM | POA: Diagnosis not present

## 2017-05-15 DIAGNOSIS — G894 Chronic pain syndrome: Secondary | ICD-10-CM

## 2017-05-15 DIAGNOSIS — R4189 Other symptoms and signs involving cognitive functions and awareness: Secondary | ICD-10-CM

## 2017-05-15 DIAGNOSIS — S069X0S Unspecified intracranial injury without loss of consciousness, sequela: Secondary | ICD-10-CM | POA: Diagnosis not present

## 2017-05-15 DIAGNOSIS — G479 Sleep disorder, unspecified: Secondary | ICD-10-CM

## 2017-05-15 DIAGNOSIS — I10 Essential (primary) hypertension: Secondary | ICD-10-CM | POA: Diagnosis not present

## 2017-05-20 NOTE — Progress Notes (Signed)
Subjective:  Patient ID: [name], male    DOB: [birth year]  Age: 36 y.o. MRN: [id number]  CC: Hypertension   HPI [name] presents for  hypertension follow up. PMH of htn and diffuse TBI.  He continues follow-up with neurology and neuro rehab specialists.  He is exercising and is adherent to low salt diet. He does not check BP at home. Cardiac symptoms none. Patient denies chest pain, chest pressure/discomfort, claudication, dyspnea, lower extremity edema, near-syncope and palpitations.  Cardiovascular risk factors: hypertension and male gender. He denies being a current smoker. Use of agents associated with hypertension: NSAIDS. History of target organ damage: none. He and his significant other reports patient sleep has improved. He reports difficulty falling asleep but reports getting around 6 to 7 hours per night of sleep. He is adherent with all current medication use.  Left knee pain: Onset 2 weeks ago.  He reports walking outside and tripping over a tree trunk.  He reports falling on his right side but his left knee hurts.  He denies any difficulty bearing weight,walking, decreased range of motion, swelling, or paresthesias.  He reports history of walking about 7 miles daily.  He reports taking ibuprofen and cold therapy for pain with some symptom improvement.   Outpatient Medications Prior to Visit  Medication Sig Dispense Refill  . albuterol (PROVENTIL HFA;VENTOLIN HFA) 108 (90 Base) MCG/ACT inhaler Inhale 2 puffs into the lungs every 4 (four) hours as needed for wheezing or shortness of breath. 1 Inhaler 2  . amantadine (SYMMETREL) 100 MG capsule Take 1 capsule (100 mg total) by mouth daily. 30 capsule 5  . ibuprofen (ADVIL,MOTRIN) 600 MG tablet TAKE 1 TABLET BY MOUTH EVERY 8 HOURS AS NEEDED FOR MODERATE PAIN OR CRAMPING. 30 tablet 0  . lamoTRIgine (LAMICTAL) 100 MG tablet Take 1 tablet (100 mg total) by mouth 2 (two) times daily. 180 tablet 4  . polycarbophil (FIBERCON) 625 MG  tablet Take 1 tablet (625 mg total) by mouth daily. 30 tablet 0  . traZODone (DESYREL) 100 MG tablet Take 1-2 tablets (100-200 mg total) by mouth at bedtime. 60 tablet 4  . lisinopril (PRINIVIL) 10 MG tablet Take 1 tablet (10 mg total) by mouth daily. 30 tablet 3  . metoprolol tartrate (LOPRESSOR) 100 MG tablet Take 1 tablet (100 mg total) by mouth 2 (two) times daily. 60 tablet 5   No facility-administered medications prior to visit.     ROS Review of Systems  Constitutional: Negative.   Eyes: Negative.   Respiratory: Negative.   Cardiovascular: Negative.   Gastrointestinal: Negative.   Musculoskeletal: Positive for arthralgias.  Skin: Negative.   Neurological: Negative.   Psychiatric/Behavioral: Positive for sleep disturbance. Negative for suicidal ideas.    Objective:  BP 129/78   Pulse (!) 54   Temp 98.7 F (37.1 C) (Oral)   Resp 18   Ht 6\' 2"  (1.88 m)   Wt 241 lb 12.8 oz (109.7 kg)   SpO2 96%   BMI 31.05 kg/m   BP/Weight 05/10/2017 05/01/2017 03/27/2017  Systolic BP 129 147 140  Diastolic BP 78 91 86  Wt. (Lbs) 241.8 - 247.5  BMI 31.05 - 31.78     Physical Exam  Constitutional: He is oriented to person, place, and time. He appears well-developed and well-nourished.  Eyes: Conjunctivae and EOM are normal. Pupils are equal, round, and reactive to light.  Neck: No JVD present.  Cardiovascular: Normal rate, regular rhythm, normal heart sounds and intact distal  pulses.  Pulmonary/Chest: Effort normal and breath sounds normal.  Abdominal: Soft. Bowel sounds are normal.  Musculoskeletal:       Left knee: He exhibits normal range of motion, no swelling and no effusion. No tenderness found.  Neurological: He is alert and oriented to person, place, and time.  Skin: Skin is warm and dry.  Psychiatric: He has a normal mood and affect. He expresses no homicidal and no suicidal ideation. He expresses no suicidal plans and no homicidal plans.  Nursing note and vitals  reviewed.   Assessment & Plan:   1. Dyslipidemia  - Lipid Panel  2. Essential hypertension  - lisinopril (PRINIVIL) 10 MG tablet; Take 1 tablet (10 mg total) daily by mouth.  Dispense: 90 tablet; Refill: 3 - metoprolol tartrate (LOPRESSOR) 100 MG tablet; Take 1 tablet (100 mg total) 2 (two) times daily by mouth.  Dispense: 90 tablet; Refill: 3  3. Follow up Add prn melatonin in addition to trazadone for sleep. - Melatonin 2.5 MG CHEW; Use as directed for as needed for sleep.      Follow-up: Return in about 6 months (around 11/07/2017), or if symptoms worsen or fail to improve.   Lizbeth BarkMandesia R Talley Kreiser FNP
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
## 2017-05-24 IMAGING — CT CT ABD-PELV W/ CM
2 of 4 series · 16 of 46 positions shown, 18 images · IV contrast (APPLIED)
Comparison: CT abdomen pelvis of 08/02/2016

CLINICAL DATA: Possible leakage impacted, evaluate for abscess or
infection, elevated white blood cell count

EXAM:
CT ABDOMEN AND PELVIS WITH CONTRAST
TECHNIQUE: Multidetector CT imaging of the abdomen and pelvis was performed
using the standard protocol following bolus administration of
intravenous contrast.
CONTRAST:  100 cc of [id number]

[Series 2: abd/ pelvis 5.0 i30f 1 · axial · 0.84mm/px · z∈[+832,+1338]mm · 13 of 111 slices shown, 15 images]
[im 5/111  soft-tissue]
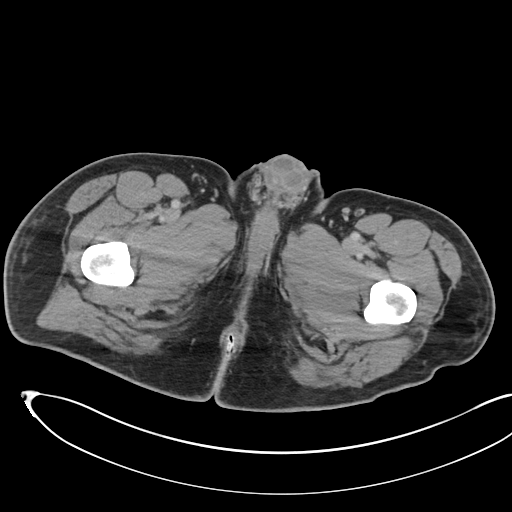
[im 5/111  bone]
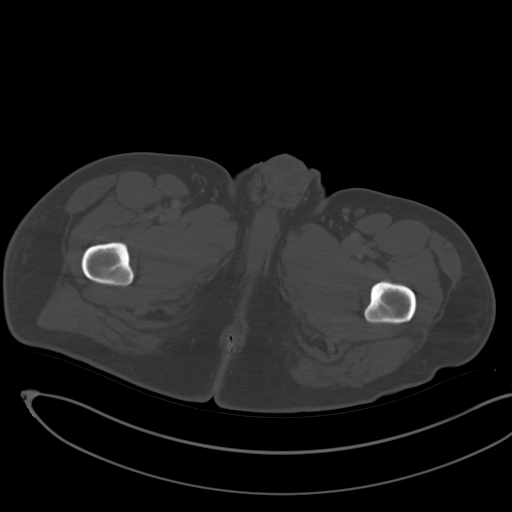
[im 13/111  soft-tissue]
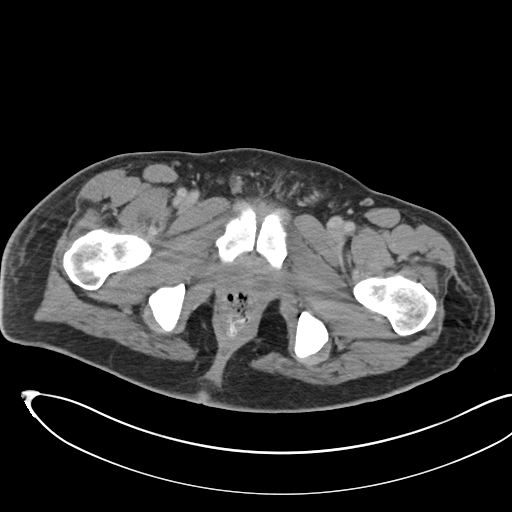
[im 22/111  soft-tissue]
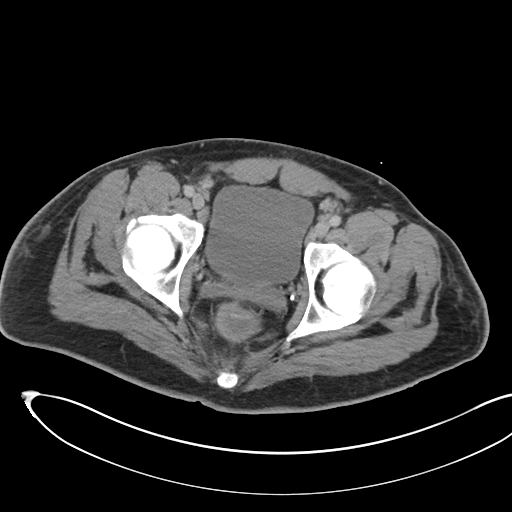
[im 30/111  soft-tissue]
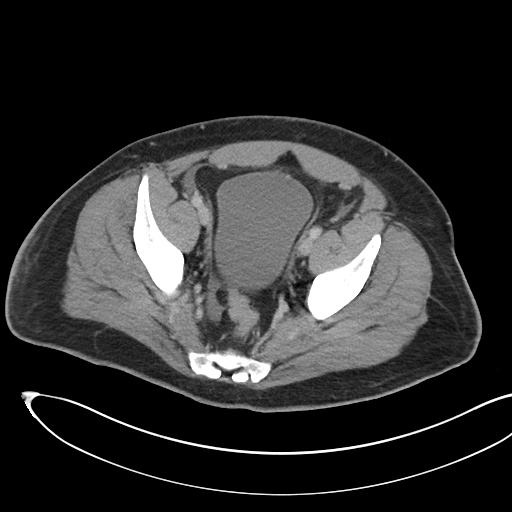
[im 39/111  soft-tissue]
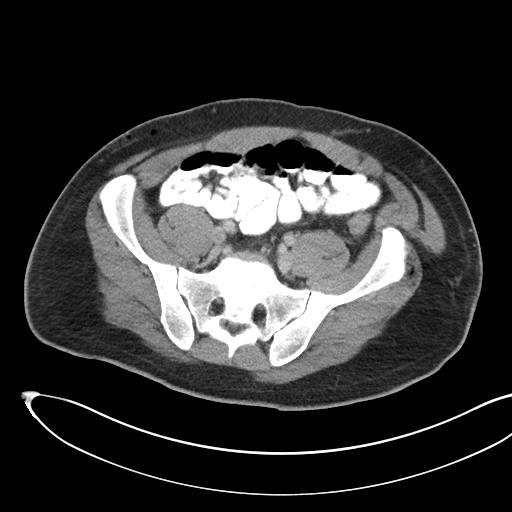
[im 47/111  soft-tissue]
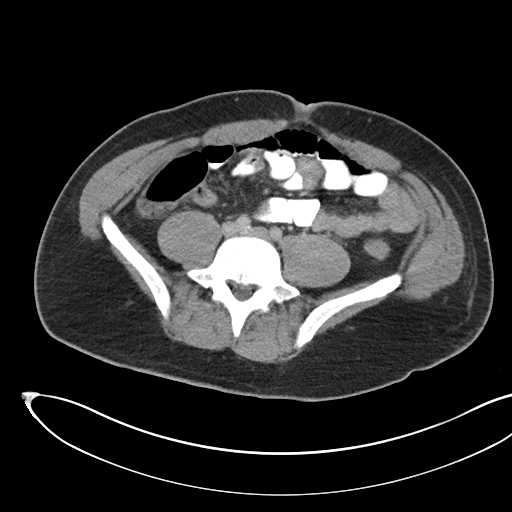
[im 56/111  soft-tissue]
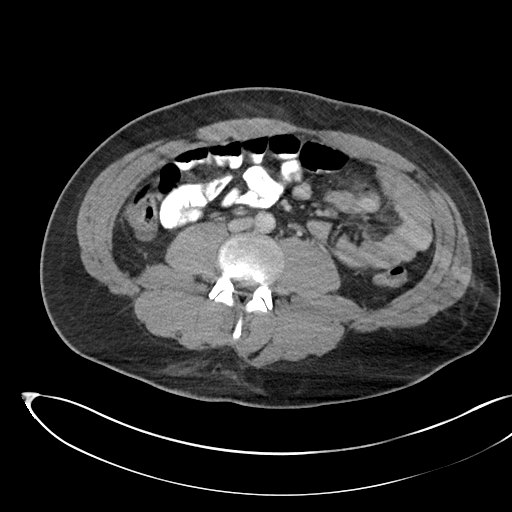
[im 64/111  soft-tissue]
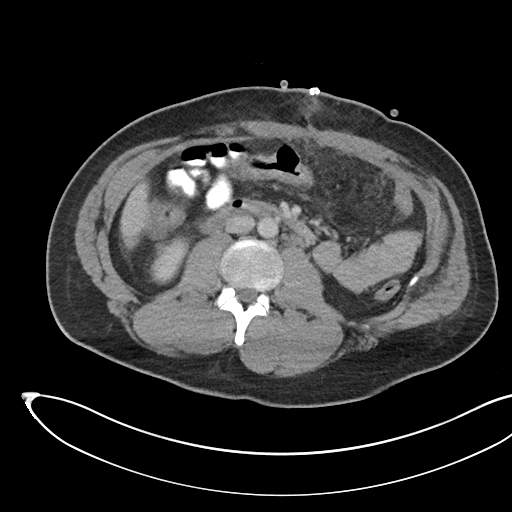
[im 72/111  soft-tissue]
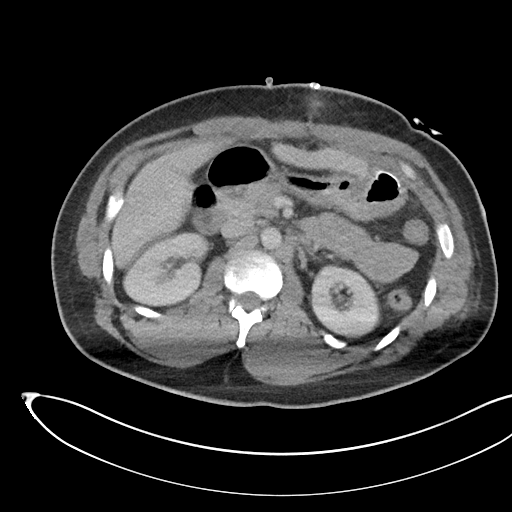
[im 72/111  bone]
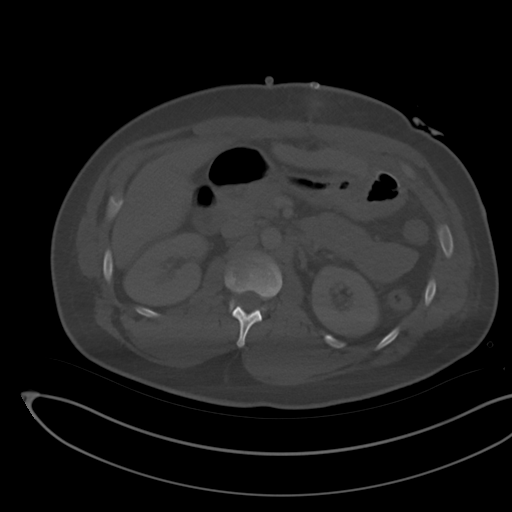
[im 81/111  soft-tissue]
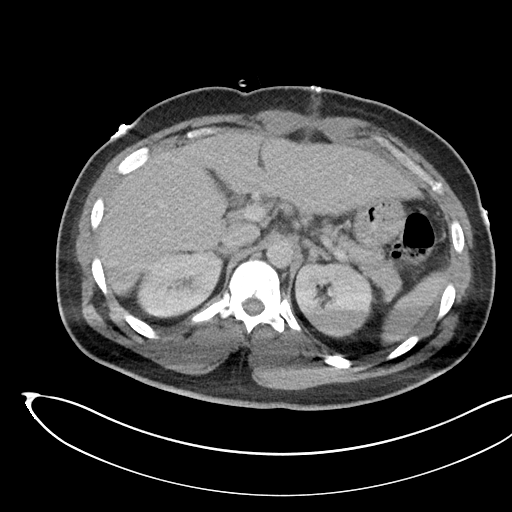
[im 89/111  soft-tissue]
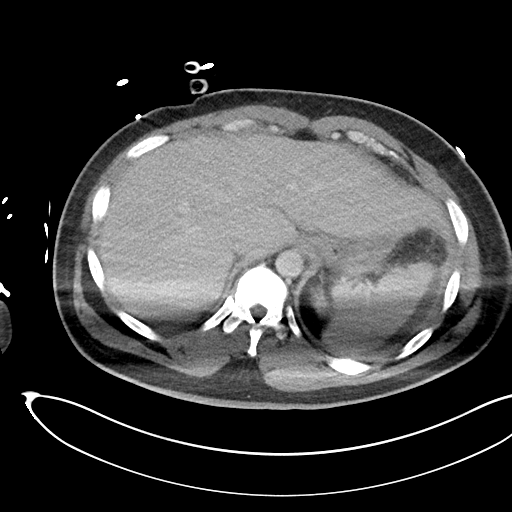
[im 98/111  soft-tissue]
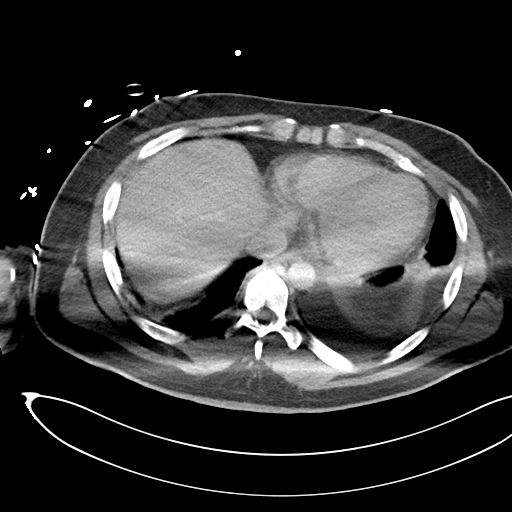
[im 106/111  soft-tissue]
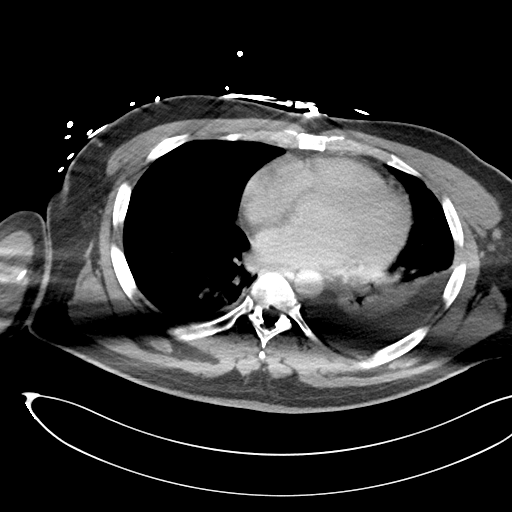

[Series 5: coronal soft tissue · coronal · 1.01mm/px · 3 of 101 slices shown]
[im 34/101  soft-tissue]
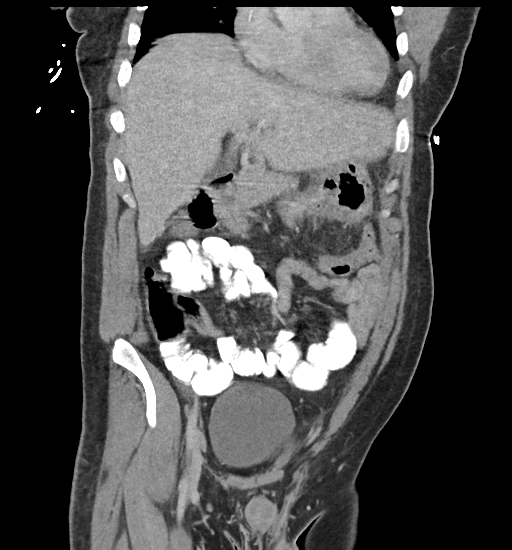
[im 45/101  soft-tissue]
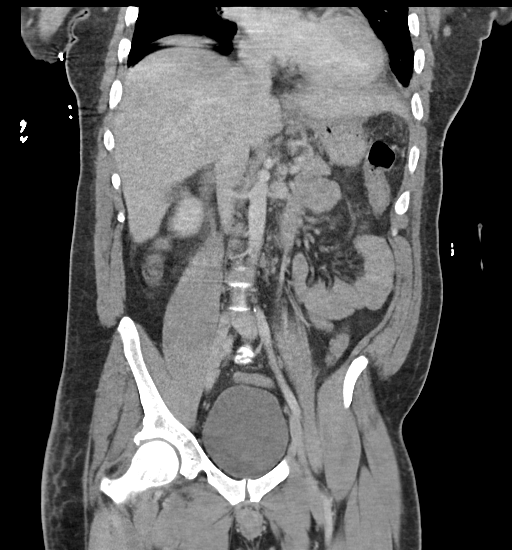
[im 56/101  soft-tissue]
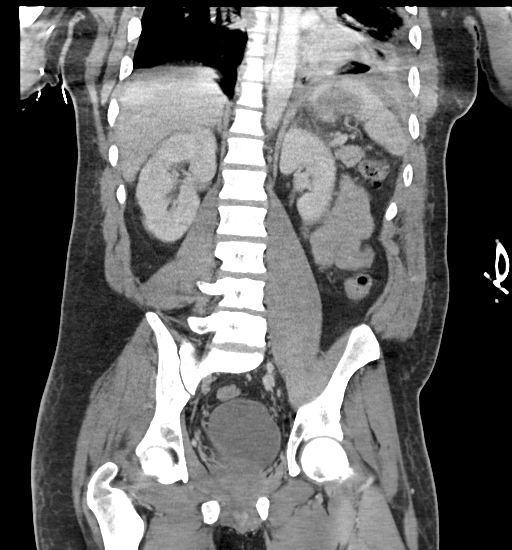

[16 of 46 positions shown; findings below may reference images not displayed]

FINDINGS: Lower chest: There is a moderate size left pleural effusion present
with atelectasis of a portion of the left lower lobe. Moderate
cardiomegaly is stable.

Hepatobiliary: The liver enhances with no focal abnormality and no
ductal dilatation is seen. No calcified gallstones are noted within
the slightly contracted gallbladder.

Pancreas: The pancreas is normal in size in the pancreatic duct is
not dilated.

Spleen: The spleen is unremarkable although there is a small amount
of fluid adjacent to the spleen.

Adrenals/Urinary Tract: The adrenal glands appear normal. The
kidneys enhance with no calculus or mass and no hydronephrosis is
seen.

Stomach/Bowel: The gastrostomy tube is noted within the stomach with
tenting of the stomach against the anterior abdominal wall. No
abscess is seen around the gastrostomy tube. No small bowel
distention is seen. The colon is largely decompressed. The terminal
ileum and appendix are unremarkable.

Vascular/Lymphatic: The abdominal aorta is normal in caliber. No
adenopathy is seen.

Reproductive: The prostate gland is normal in size.

Other: There is some free fluid in the pelvis. With free fluid
around the spleen as well, either leakage from the tube, or possibly
peritonitis would be considerations. However, no discrete abscess is
seen.

Musculoskeletal: The lumbar vertebrae are in normal alignment with
normal intervertebral disc spaces. The SI joints are corticated.
IMPRESSION: 1. There is some free fluid within the abdomen and pelvis as noted
above. This could be due to leakage from the tube although
peritonitis would be a consideration as well.
2. Moderate size left pleural effusion with left basilar
atelectasis.
3. Gastrostomy tube appears to be well-positioned within the stomach
with no surrounding abscess.

## 2017-05-28 ENCOUNTER — Telehealth: Payer: Self-pay

## 2017-05-28 NOTE — Telephone Encounter (Signed)
-----   Message from [name] Hairston, FNP sent at 05/22/2017  1:16 PM EST ----- Cholesterol levels show improvement.  Eating a diet lower in saturated fat. Limit your intake of fried foods, red meats, and whole milk.Continue to exercise. 

## 2017-05-28 NOTE — Telephone Encounter (Signed)
CMA call regarding lab results   Patient mother answer & verify DOB   Patient mother was aware and understood lab results

## 2017-05-29 ENCOUNTER — Encounter: Payer: Medicaid Other | Admitting: Psychology

## 2017-05-29 ENCOUNTER — Encounter: Payer: Self-pay | Admitting: Psychology

## 2017-05-29 ENCOUNTER — Telehealth: Payer: Self-pay

## 2017-05-29 DIAGNOSIS — R451 Restlessness and agitation: Secondary | ICD-10-CM | POA: Diagnosis not present

## 2017-05-29 DIAGNOSIS — G894 Chronic pain syndrome: Secondary | ICD-10-CM

## 2017-05-29 DIAGNOSIS — G479 Sleep disorder, unspecified: Secondary | ICD-10-CM | POA: Diagnosis not present

## 2017-05-29 DIAGNOSIS — F068 Other specified mental disorders due to known physiological condition: Secondary | ICD-10-CM | POA: Diagnosis not present

## 2017-05-29 DIAGNOSIS — S069X0S Unspecified intracranial injury without loss of consciousness, sequela: Secondary | ICD-10-CM | POA: Diagnosis not present

## 2017-05-29 DIAGNOSIS — R4189 Other symptoms and signs involving cognitive functions and awareness: Secondary | ICD-10-CM

## 2017-05-29 DIAGNOSIS — S06309A Unspecified focal traumatic brain injury with loss of consciousness of unspecified duration, initial encounter: Secondary | ICD-10-CM | POA: Diagnosis not present
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
## 2017-05-29 NOTE — Telephone Encounter (Signed)
CMA call regarding lab results   Patient mother answer & verify DOB   Patient mother was aware an understood

## 2017-05-29 NOTE — Telephone Encounter (Signed)
-----   Message from Lizbeth BarkMandesia R Hairston, FNP sent at 05/22/2017  1:16 PM EST ----- Cholesterol levels show improvement.  Eating a diet lower in saturated fat. Limit your intake of fried foods, red meats, and whole milk.Continue to exercise.

## 2017-06-12 ENCOUNTER — Encounter: Payer: Medicaid Other | Attending: Physical Medicine & Rehabilitation | Admitting: Psychology

## 2017-06-12 ENCOUNTER — Encounter: Payer: Self-pay | Admitting: Psychology

## 2017-06-12 DIAGNOSIS — G479 Sleep disorder, unspecified: Secondary | ICD-10-CM

## 2017-06-12 DIAGNOSIS — J45909 Unspecified asthma, uncomplicated: Secondary | ICD-10-CM | POA: Diagnosis not present

## 2017-06-12 DIAGNOSIS — F1210 Cannabis abuse, uncomplicated: Secondary | ICD-10-CM | POA: Insufficient documentation

## 2017-06-12 DIAGNOSIS — F068 Other specified mental disorders due to known physiological condition: Secondary | ICD-10-CM | POA: Diagnosis not present

## 2017-06-12 DIAGNOSIS — S069X0S Unspecified intracranial injury without loss of consciousness, sequela: Secondary | ICD-10-CM | POA: Diagnosis not present

## 2017-06-12 DIAGNOSIS — S06309A Unspecified focal traumatic brain injury with loss of consciousness of unspecified duration, initial encounter: Secondary | ICD-10-CM | POA: Diagnosis not present

## 2017-06-12 DIAGNOSIS — F1010 Alcohol abuse, uncomplicated: Secondary | ICD-10-CM | POA: Diagnosis not present

## 2017-06-12 DIAGNOSIS — R451 Restlessness and agitation: Secondary | ICD-10-CM

## 2017-06-12 DIAGNOSIS — G894 Chronic pain syndrome: Secondary | ICD-10-CM

## 2017-06-12 DIAGNOSIS — I10 Essential (primary) hypertension: Secondary | ICD-10-CM | POA: Insufficient documentation

## 2017-06-12 NOTE — Progress Notes (Signed)
Today we administered parts of the comprehensive attention battery.  The hope was that we would be able to complete this battery even though we had not scheduled it for today but whether suggested other patients would not show up.  However, when the next patient showed up we were not able to complete the testing.  Will complete the testing on next appointment.

## 2017-07-04 ENCOUNTER — Encounter: Payer: Medicaid Other | Attending: Physical Medicine & Rehabilitation | Admitting: Physical Medicine & Rehabilitation

## 2017-07-04 ENCOUNTER — Encounter: Payer: Self-pay | Admitting: Physical Medicine & Rehabilitation

## 2017-07-04 VITALS — BP 120/73 | HR 54

## 2017-07-04 DIAGNOSIS — I10 Essential (primary) hypertension: Secondary | ICD-10-CM | POA: Diagnosis not present

## 2017-07-04 DIAGNOSIS — F1210 Cannabis abuse, uncomplicated: Secondary | ICD-10-CM | POA: Insufficient documentation

## 2017-07-04 DIAGNOSIS — J45909 Unspecified asthma, uncomplicated: Secondary | ICD-10-CM | POA: Diagnosis not present

## 2017-07-04 DIAGNOSIS — F068 Other specified mental disorders due to known physiological condition: Secondary | ICD-10-CM

## 2017-07-04 DIAGNOSIS — F1010 Alcohol abuse, uncomplicated: Secondary | ICD-10-CM | POA: Diagnosis not present

## 2017-07-04 DIAGNOSIS — S06309A Unspecified focal traumatic brain injury with loss of consciousness of unspecified duration, initial encounter: Secondary | ICD-10-CM | POA: Diagnosis not present

## 2017-07-04 DIAGNOSIS — S062X3S Diffuse traumatic brain injury with loss of consciousness of 1 hour to 5 hours 59 minutes, sequela: Secondary | ICD-10-CM

## 2017-07-04 DIAGNOSIS — S069X0S Unspecified intracranial injury without loss of consciousness, sequela: Secondary | ICD-10-CM | POA: Diagnosis not present

## 2017-07-04 NOTE — Patient Instructions (Signed)
PLEASE FEEL FREE TO CALL OUR OFFICE WITH ANY PROBLEMS OR QUESTIONS [phone])    DECREASE METOPROLOL TO 50MG  (HALF TABLET) TWICE DAILY.
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
## 2017-07-09 DIAGNOSIS — I10 Essential (primary) hypertension: Secondary | ICD-10-CM

## 2017-07-10 ENCOUNTER — Ambulatory Visit: Payer: Medicaid Other | Admitting: Neurology

## 2017-07-10 ENCOUNTER — Encounter: Payer: Self-pay | Admitting: Neurology

## 2017-07-10 VITALS — BP 138/73 | HR 55 | Ht 74.0 in | Wt 237.0 lb

## 2017-07-10 DIAGNOSIS — S069X3S Unspecified intracranial injury with loss of consciousness of 1 hour to 5 hours 59 minutes, sequela: Secondary | ICD-10-CM

## 2017-07-10 DIAGNOSIS — R569 Unspecified convulsions: Secondary | ICD-10-CM | POA: Diagnosis not present
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
## 2017-07-12 ENCOUNTER — Encounter: Payer: Medicaid Other | Admitting: Psychology

## 2017-07-12 ENCOUNTER — Encounter: Payer: Self-pay | Admitting: Psychology

## 2017-07-12 DIAGNOSIS — S069X0S Unspecified intracranial injury without loss of consciousness, sequela: Secondary | ICD-10-CM

## 2017-07-12 DIAGNOSIS — G894 Chronic pain syndrome: Secondary | ICD-10-CM

## 2017-07-12 DIAGNOSIS — G479 Sleep disorder, unspecified: Secondary | ICD-10-CM | POA: Diagnosis not present

## 2017-07-12 DIAGNOSIS — F068 Other specified mental disorders due to known physiological condition: Secondary | ICD-10-CM | POA: Diagnosis not present

## 2017-07-12 DIAGNOSIS — S06309A Unspecified focal traumatic brain injury with loss of consciousness of unspecified duration, initial encounter: Secondary | ICD-10-CM | POA: Diagnosis not present

## 2017-07-12 NOTE — Progress Notes (Signed)
Today we completed administration of the comprehensive attention battery as well as the CPT.  The administration took 1-1/2 hours and then scoring was completed.  Integration of patient data, interpretation of test results, clinical decision making and report will be conducted on 07/19/2017.  The test administration was conducted by myself.

## 2017-07-19 ENCOUNTER — Encounter (HOSPITAL_BASED_OUTPATIENT_CLINIC_OR_DEPARTMENT_OTHER): Payer: Medicaid Other | Admitting: Psychology

## 2017-07-19 DIAGNOSIS — G894 Chronic pain syndrome: Secondary | ICD-10-CM

## 2017-07-19 DIAGNOSIS — F068 Other specified mental disorders due to known physiological condition: Secondary | ICD-10-CM

## 2017-07-19 DIAGNOSIS — S069X0S Unspecified intracranial injury without loss of consciousness, sequela: Secondary | ICD-10-CM

## 2017-07-19 DIAGNOSIS — G479 Sleep disorder, unspecified: Secondary | ICD-10-CM | POA: Diagnosis not present

## 2017-07-19 DIAGNOSIS — S06309A Unspecified focal traumatic brain injury with loss of consciousness of unspecified duration, initial encounter: Secondary | ICD-10-CM | POA: Diagnosis not present

## 2017-07-24 ENCOUNTER — Encounter: Payer: Medicaid Other | Admitting: Psychology

## 2017-07-24 ENCOUNTER — Encounter: Payer: Self-pay | Admitting: Psychology

## 2017-07-24 DIAGNOSIS — G479 Sleep disorder, unspecified: Secondary | ICD-10-CM | POA: Diagnosis not present

## 2017-07-24 DIAGNOSIS — S06309A Unspecified focal traumatic brain injury with loss of consciousness of unspecified duration, initial encounter: Secondary | ICD-10-CM | POA: Diagnosis not present

## 2017-07-24 DIAGNOSIS — S069X0S Unspecified intracranial injury without loss of consciousness, sequela: Secondary | ICD-10-CM | POA: Diagnosis not present

## 2017-07-24 DIAGNOSIS — F068 Other specified mental disorders due to known physiological condition: Secondary | ICD-10-CM

## 2017-07-24 NOTE — Progress Notes (Signed)
Provided feedback regarding the results of the recent neuropsychological evaluation.  The patient's performance suggested that he is basically returned back to baseline with the exception of issues related to some mild slowing of overall verbal fluency and mild slowing in overall information processing speed for more complex information.  The patient sustained attention was good and overall he is doing fine with regard to cognitive functioning.  The patient is cleared from a neuropsychological standpoint to return to drive or work however his seizure this past August means that he should wait a couple more months before returning to drive.  He is only had one seizure but it was of a grand mal in nature in August.

## 2017-07-31 ENCOUNTER — Telehealth: Payer: Self-pay | Admitting: *Deleted

## 2017-07-31 DIAGNOSIS — M25562 Pain in left knee: Principal | ICD-10-CM

## 2017-07-31 DIAGNOSIS — G8929 Other chronic pain: Secondary | ICD-10-CM

## 2017-07-31 NOTE — Telephone Encounter (Signed)
Patient left a messages asking for Dr. Riley KillSwartz to call him back.  I contacted the patient and he states that he wen tback to work.  He says he got fired 2 days later for giving 'attitude' to a customer. I asked what his expectations were.  He said he would appreciate a call from Dr. Riley KillSwartz.

## 2017-08-01 NOTE — Telephone Encounter (Signed)
Spoke with [name] at [employer] regarding his vocational issues and disability application.  Our concerns regarding his psychosocial interactions and ongoing cognitive deficits ultimately led to his dismissal from the work he attempted.  [name] also talked to me about his left knee which is becoming increasingly painful.  He has documented tricompartmental arthritis as well as meniscal tears in the left knee amongst other soft tissue injuries.  We will make a referral to orthopedic surgery for surgical assessment.

## 2017-08-15 ENCOUNTER — Telehealth: Payer: Self-pay | Admitting: *Deleted

## 2017-08-15 NOTE — Telephone Encounter (Signed)
Patient called and I spoke to him directly.  He is asking for a letter from Dr. Riley KillSwartz outlining why he is unable to work.  He says this letter will be given to his lawyer who is helping him with his disability application. Please advise

## 2017-08-19 ENCOUNTER — Encounter: Payer: Self-pay | Admitting: Psychology
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
## 2017-09-03 ENCOUNTER — Encounter: Payer: Self-pay | Admitting: Physical Medicine & Rehabilitation

## 2017-09-03 ENCOUNTER — Encounter: Payer: Medicaid Other | Attending: Physical Medicine & Rehabilitation | Admitting: Physical Medicine & Rehabilitation

## 2017-09-03 VITALS — BP 143/85 | HR 58

## 2017-09-03 DIAGNOSIS — F068 Other specified mental disorders due to known physiological condition: Secondary | ICD-10-CM

## 2017-09-03 DIAGNOSIS — M25562 Pain in left knee: Secondary | ICD-10-CM | POA: Diagnosis not present

## 2017-09-03 DIAGNOSIS — S062X3S Diffuse traumatic brain injury with loss of consciousness of 1 hour to 5 hours 59 minutes, sequela: Secondary | ICD-10-CM | POA: Diagnosis not present

## 2017-09-03 DIAGNOSIS — S06309A Unspecified focal traumatic brain injury with loss of consciousness of unspecified duration, initial encounter: Secondary | ICD-10-CM | POA: Insufficient documentation

## 2017-09-03 DIAGNOSIS — S069X0S Unspecified intracranial injury without loss of consciousness, sequela: Secondary | ICD-10-CM | POA: Insufficient documentation

## 2017-09-03 DIAGNOSIS — R4689 Other symptoms and signs involving appearance and behavior: Secondary | ICD-10-CM | POA: Diagnosis not present

## 2017-09-03 DIAGNOSIS — G8929 Other chronic pain: Secondary | ICD-10-CM | POA: Diagnosis not present

## 2017-09-03 DIAGNOSIS — J45909 Unspecified asthma, uncomplicated: Secondary | ICD-10-CM | POA: Insufficient documentation

## 2017-09-03 DIAGNOSIS — F1010 Alcohol abuse, uncomplicated: Secondary | ICD-10-CM | POA: Insufficient documentation

## 2017-09-03 DIAGNOSIS — F1210 Cannabis abuse, uncomplicated: Secondary | ICD-10-CM | POA: Diagnosis not present

## 2017-09-03 DIAGNOSIS — I10 Essential (primary) hypertension: Secondary | ICD-10-CM | POA: Insufficient documentation

## 2017-09-03 MED ORDER — DIVALPROEX SODIUM 250 MG PO DR TAB: 250.0000 mg | DELAYED_RELEASE_TABLET | Freq: Two times a day (BID) | ORAL | 5 refills | Status: DC

## 2017-09-03 NOTE — Patient Instructions (Signed)
PLEASE FEEL FREE TO CALL OUR OFFICE WITH ANY PROBLEMS OR QUESTIONS ([phone])      
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
## 2017-09-14 ENCOUNTER — Other Ambulatory Visit: Payer: Self-pay | Admitting: Physical Medicine & Rehabilitation

## 2017-09-14 DIAGNOSIS — G479 Sleep disorder, unspecified: Secondary | ICD-10-CM

## 2017-09-14 DIAGNOSIS — G894 Chronic pain syndrome: Secondary | ICD-10-CM

## 2017-09-14 DIAGNOSIS — F068 Other specified mental disorders due to known physiological condition: Secondary | ICD-10-CM

## 2017-09-14 DIAGNOSIS — Z5181 Encounter for therapeutic drug level monitoring: Secondary | ICD-10-CM

## 2017-09-14 DIAGNOSIS — S069X0S Unspecified intracranial injury without loss of consciousness, sequela: Secondary | ICD-10-CM

## 2017-09-14 DIAGNOSIS — S062X3S Diffuse traumatic brain injury with loss of consciousness of 1 hour to 5 hours 59 minutes, sequela: Secondary | ICD-10-CM

## 2017-09-14 DIAGNOSIS — Z79899 Other long term (current) drug therapy: Secondary | ICD-10-CM

## 2017-09-14 DIAGNOSIS — G4701 Insomnia due to medical condition: Secondary | ICD-10-CM

## 2017-09-17 ENCOUNTER — Other Ambulatory Visit: Payer: Self-pay

## 2017-09-17 DIAGNOSIS — S062X3S Diffuse traumatic brain injury with loss of consciousness of 1 hour to 5 hours 59 minutes, sequela: Secondary | ICD-10-CM

## 2017-09-17 DIAGNOSIS — G4701 Insomnia due to medical condition: Secondary | ICD-10-CM

## 2017-09-17 DIAGNOSIS — G894 Chronic pain syndrome: Secondary | ICD-10-CM

## 2017-09-17 DIAGNOSIS — F068 Other specified mental disorders due to known physiological condition: Secondary | ICD-10-CM

## 2017-09-17 DIAGNOSIS — R4189 Other symptoms and signs involving cognitive functions and awareness: Secondary | ICD-10-CM

## 2017-09-17 DIAGNOSIS — S069X0S Unspecified intracranial injury without loss of consciousness, sequela: Secondary | ICD-10-CM

## 2017-09-17 DIAGNOSIS — Z79899 Other long term (current) drug therapy: Secondary | ICD-10-CM

## 2017-09-17 DIAGNOSIS — G479 Sleep disorder, unspecified: Secondary | ICD-10-CM

## 2017-09-17 DIAGNOSIS — Z5181 Encounter for therapeutic drug level monitoring: Secondary | ICD-10-CM

## 2017-09-17 MED ORDER — TRAZODONE HCL 100 MG PO TABS: 100.0000 mg | ORAL_TABLET | Freq: Every day | ORAL | 4 refills | Status: DC

## 2017-09-27 ENCOUNTER — Telehealth: Payer: Self-pay | Admitting: *Deleted

## 2017-09-27 NOTE — Telephone Encounter (Signed)
Norm's mother called and is asking about why the delay in the MRI and delay for how long? Her number is [phone] Please advise.

## 2017-09-27 NOTE — Telephone Encounter (Signed)
[name]'s mother called and is asking about why the delay in the MRI and delay for how long? Her number is [phone] Please advise.  Patient left a message with concern about upcoming MRI and the shunt in his brain. Apparently the facility doing the MRI needs authorization from Dr. Riley KillSwartz and instructions regarding the shunt and reset (?).    [facility] also [facility] expressing concern and the need for authorization from Dr. Riley KillSwartz. I texted Dr. Riley KillSwartz for urgent reply he stated that procedure needs to be put on hold until next step can be figured out.

## 2017-09-28 NOTE — Telephone Encounter (Signed)
If he's following up with ortho for the knee, I would ask them to order the study that they would prefer given the MRI isn't an option.

## 2017-09-28 NOTE — Telephone Encounter (Signed)
Passed Dr. Rosalyn ChartersSwartz's message on to patient's mother. Encouraged to call us back with any questions

## 2017-10-15 ENCOUNTER — Other Ambulatory Visit: Payer: Self-pay

## 2017-10-15 ENCOUNTER — Encounter: Payer: Medicaid Other | Attending: Physical Medicine & Rehabilitation | Admitting: Physical Medicine & Rehabilitation

## 2017-10-15 ENCOUNTER — Encounter: Payer: Self-pay | Admitting: Physical Medicine & Rehabilitation

## 2017-10-15 VITALS — BP 152/93 | HR 56 | Ht 74.0 in | Wt 227.0 lb

## 2017-10-15 DIAGNOSIS — G8929 Other chronic pain: Secondary | ICD-10-CM

## 2017-10-15 DIAGNOSIS — J45909 Unspecified asthma, uncomplicated: Secondary | ICD-10-CM | POA: Diagnosis not present

## 2017-10-15 DIAGNOSIS — R4689 Other symptoms and signs involving appearance and behavior: Secondary | ICD-10-CM | POA: Diagnosis not present

## 2017-10-15 DIAGNOSIS — S069X0S Unspecified intracranial injury without loss of consciousness, sequela: Secondary | ICD-10-CM

## 2017-10-15 DIAGNOSIS — I10 Essential (primary) hypertension: Secondary | ICD-10-CM | POA: Insufficient documentation

## 2017-10-15 DIAGNOSIS — F1210 Cannabis abuse, uncomplicated: Secondary | ICD-10-CM | POA: Insufficient documentation

## 2017-10-15 DIAGNOSIS — S062X3S Diffuse traumatic brain injury with loss of consciousness of 1 hour to 5 hours 59 minutes, sequela: Secondary | ICD-10-CM

## 2017-10-15 DIAGNOSIS — S06309A Unspecified focal traumatic brain injury with loss of consciousness of unspecified duration, initial encounter: Secondary | ICD-10-CM | POA: Diagnosis not present

## 2017-10-15 DIAGNOSIS — F1010 Alcohol abuse, uncomplicated: Secondary | ICD-10-CM | POA: Insufficient documentation

## 2017-10-15 DIAGNOSIS — M25562 Pain in left knee: Secondary | ICD-10-CM
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
## 2017-10-15 NOTE — Patient Instructions (Signed)
PLEASE FEEL FREE TO CALL OUR OFFICE WITH ANY PROBLEMS OR QUESTIONS ([phone])      

## 2017-11-20 ENCOUNTER — Encounter: Payer: Self-pay | Admitting: Internal Medicine

## 2017-11-20 ENCOUNTER — Ambulatory Visit: Payer: Medicaid Other | Attending: Internal Medicine | Admitting: Internal Medicine

## 2017-11-20 VITALS — BP 131/84 | HR 68 | Temp 98.2°F | Resp 16 | Ht 74.0 in | Wt 217.5 lb

## 2017-11-20 DIAGNOSIS — D62 Acute posthemorrhagic anemia: Secondary | ICD-10-CM | POA: Insufficient documentation

## 2017-11-20 DIAGNOSIS — R1310 Dysphagia, unspecified: Secondary | ICD-10-CM | POA: Diagnosis not present

## 2017-11-20 DIAGNOSIS — F17200 Nicotine dependence, unspecified, uncomplicated: Secondary | ICD-10-CM | POA: Diagnosis not present

## 2017-11-20 DIAGNOSIS — R569 Unspecified convulsions: Secondary | ICD-10-CM | POA: Insufficient documentation

## 2017-11-20 DIAGNOSIS — Z8249 Family history of ischemic heart disease and other diseases of the circulatory system: Secondary | ICD-10-CM | POA: Diagnosis not present

## 2017-11-20 DIAGNOSIS — G4700 Insomnia, unspecified: Secondary | ICD-10-CM | POA: Insufficient documentation

## 2017-11-20 DIAGNOSIS — F17210 Nicotine dependence, cigarettes, uncomplicated: Secondary | ICD-10-CM | POA: Insufficient documentation

## 2017-11-20 DIAGNOSIS — Z930 Tracheostomy status: Secondary | ICD-10-CM | POA: Diagnosis not present

## 2017-11-20 DIAGNOSIS — R739 Hyperglycemia, unspecified: Secondary | ICD-10-CM | POA: Diagnosis not present

## 2017-11-20 DIAGNOSIS — Z931 Gastrostomy status: Secondary | ICD-10-CM | POA: Insufficient documentation

## 2017-11-20 DIAGNOSIS — I10 Essential (primary) hypertension: Secondary | ICD-10-CM | POA: Diagnosis present

## 2017-11-20 DIAGNOSIS — R451 Restlessness and agitation: Secondary | ICD-10-CM | POA: Diagnosis not present

## 2017-11-20 DIAGNOSIS — Z79899 Other long term (current) drug therapy: Secondary | ICD-10-CM | POA: Insufficient documentation

## 2017-11-20 DIAGNOSIS — Z87820 Personal history of traumatic brain injury: Secondary | ICD-10-CM | POA: Insufficient documentation

## 2017-11-20 DIAGNOSIS — R5383 Other fatigue: Secondary | ICD-10-CM | POA: Insufficient documentation

## 2017-11-20 DIAGNOSIS — M25562 Pain in left knee: Secondary | ICD-10-CM | POA: Diagnosis not present

## 2017-11-20 NOTE — Patient Instructions (Signed)
Let me know when you are ready to give a trail of quitting cigarettes.
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
## 2017-12-26 ENCOUNTER — Other Ambulatory Visit: Payer: Self-pay

## 2017-12-26 DIAGNOSIS — I10 Essential (primary) hypertension: Secondary | ICD-10-CM

## 2017-12-27 MED ORDER — METOPROLOL TARTRATE 100 MG PO TABS: 100.0000 mg | ORAL_TABLET | Freq: Two times a day (BID) | ORAL | 3 refills | Status: DC
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
## 2018-01-07 ENCOUNTER — Encounter: Payer: Self-pay | Admitting: Nurse Practitioner

## 2018-01-07 ENCOUNTER — Ambulatory Visit: Payer: Medicaid Other | Admitting: Nurse Practitioner

## 2018-01-07 VITALS — BP 147/91 | HR 55 | Ht 74.0 in | Wt 222.8 lb

## 2018-01-07 DIAGNOSIS — Z298 Encounter for other specified prophylactic measures: Secondary | ICD-10-CM | POA: Diagnosis not present

## 2018-01-07 DIAGNOSIS — R569 Unspecified convulsions: Secondary | ICD-10-CM

## 2018-01-07 NOTE — Progress Notes (Signed)
I have reviewed and agreed above plan. 

## 2018-01-07 NOTE — Patient Instructions (Signed)
Continue Lamictal 100mg  twice daily for now Will get lamictal level Call for seizure activity F/U in 6 months

## 2018-01-09 ENCOUNTER — Encounter: Payer: Medicaid Other | Attending: Physical Medicine & Rehabilitation | Admitting: Physical Medicine & Rehabilitation

## 2018-01-09 ENCOUNTER — Encounter: Payer: Self-pay | Admitting: Physical Medicine & Rehabilitation

## 2018-01-09 ENCOUNTER — Telehealth: Payer: Self-pay | Admitting: *Deleted

## 2018-01-09 ENCOUNTER — Other Ambulatory Visit: Payer: Self-pay

## 2018-01-09 VITALS — BP 132/84 | HR 64 | Ht 74.0 in | Wt 221.0 lb

## 2018-01-09 DIAGNOSIS — S06309A Unspecified focal traumatic brain injury with loss of consciousness of unspecified duration, initial encounter: Secondary | ICD-10-CM | POA: Insufficient documentation

## 2018-01-09 DIAGNOSIS — F1210 Cannabis abuse, uncomplicated: Secondary | ICD-10-CM | POA: Diagnosis not present

## 2018-01-09 DIAGNOSIS — F1010 Alcohol abuse, uncomplicated: Secondary | ICD-10-CM | POA: Insufficient documentation

## 2018-01-09 DIAGNOSIS — S062X3S Diffuse traumatic brain injury with loss of consciousness of 1 hour to 5 hours 59 minutes, sequela: Secondary | ICD-10-CM

## 2018-01-09 DIAGNOSIS — I10 Essential (primary) hypertension: Secondary | ICD-10-CM | POA: Diagnosis not present

## 2018-01-09 DIAGNOSIS — R4689 Other symptoms and signs involving appearance and behavior: Secondary | ICD-10-CM | POA: Diagnosis not present

## 2018-01-09 DIAGNOSIS — J45909 Unspecified asthma, uncomplicated: Secondary | ICD-10-CM | POA: Diagnosis not present

## 2018-01-09 DIAGNOSIS — S069X0S Unspecified intracranial injury without loss of consciousness, sequela: Secondary | ICD-10-CM | POA: Diagnosis not present

## 2018-01-09 LAB — LAMOTRIGINE LEVEL: Lamotrigine Lvl: 4.4 ug/mL (ref 2.0–20.0)

## 2018-01-09 NOTE — Telephone Encounter (Signed)
LVM for fiancee, [name] on DPR and advised her that patient's Lamictal level is stable. Informed her the NP will renew His prescription. Left number for any questions.
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
## 2018-01-09 NOTE — Patient Instructions (Signed)
PLEASE FEEL FREE TO CALL OUR OFFICE WITH ANY PROBLEMS OR QUESTIONS ([phone])      

## 2018-01-21 ENCOUNTER — Telehealth: Payer: Self-pay

## 2018-01-21 NOTE — Telephone Encounter (Signed)
Pt called stating he needs medical records.

## 2018-01-27 ENCOUNTER — Other Ambulatory Visit: Payer: Self-pay | Admitting: Physical Medicine & Rehabilitation

## 2018-01-27 DIAGNOSIS — S062X3S Diffuse traumatic brain injury with loss of consciousness of 1 hour to 5 hours 59 minutes, sequela: Secondary | ICD-10-CM

## 2018-01-27 DIAGNOSIS — Z5181 Encounter for therapeutic drug level monitoring: Secondary | ICD-10-CM

## 2018-01-27 DIAGNOSIS — S069X0S Unspecified intracranial injury without loss of consciousness, sequela: Secondary | ICD-10-CM

## 2018-01-27 DIAGNOSIS — G894 Chronic pain syndrome: Secondary | ICD-10-CM

## 2018-01-27 DIAGNOSIS — G479 Sleep disorder, unspecified: Secondary | ICD-10-CM

## 2018-01-27 DIAGNOSIS — G4701 Insomnia due to medical condition: Secondary | ICD-10-CM

## 2018-01-27 DIAGNOSIS — Z79899 Other long term (current) drug therapy: Secondary | ICD-10-CM

## 2018-01-27 DIAGNOSIS — F068 Other specified mental disorders due to known physiological condition: Secondary | ICD-10-CM

## 2018-02-04 ENCOUNTER — Telehealth: Payer: Self-pay | Admitting: *Deleted

## 2018-02-04 NOTE — Telephone Encounter (Signed)
Patient called asking for a refill of his clonazepam.  I reviewed the medical record and that ,medication is not on his current or historical list.  I contacted patient and he seemed confused (TBI).  I carefully explained and suggested that he contact his PCP.

## 2018-02-12 ENCOUNTER — Other Ambulatory Visit: Payer: Self-pay | Admitting: Physical Medicine & Rehabilitation

## 2018-02-12 DIAGNOSIS — R4689 Other symptoms and signs involving appearance and behavior: Secondary | ICD-10-CM

## 2018-02-12 DIAGNOSIS — S069X0S Unspecified intracranial injury without loss of consciousness, sequela: Principal | ICD-10-CM

## 2018-02-21 ENCOUNTER — Ambulatory Visit: Payer: Medicaid Other | Attending: Internal Medicine | Admitting: Internal Medicine

## 2018-02-21 ENCOUNTER — Encounter: Payer: Self-pay | Admitting: Internal Medicine

## 2018-02-21 VITALS — BP 150/91 | HR 52 | Temp 98.1°F | Resp 16 | Wt 220.2 lb

## 2018-02-21 DIAGNOSIS — F17200 Nicotine dependence, unspecified, uncomplicated: Secondary | ICD-10-CM

## 2018-02-21 DIAGNOSIS — G43009 Migraine without aura, not intractable, without status migrainosus: Secondary | ICD-10-CM | POA: Diagnosis not present

## 2018-02-21 DIAGNOSIS — Z8249 Family history of ischemic heart disease and other diseases of the circulatory system: Secondary | ICD-10-CM | POA: Diagnosis not present

## 2018-02-21 DIAGNOSIS — R569 Unspecified convulsions: Secondary | ICD-10-CM | POA: Insufficient documentation

## 2018-02-21 DIAGNOSIS — Z2821 Immunization not carried out because of patient refusal: Secondary | ICD-10-CM

## 2018-02-21 DIAGNOSIS — Z79899 Other long term (current) drug therapy: Secondary | ICD-10-CM | POA: Insufficient documentation

## 2018-02-21 DIAGNOSIS — S069X3S Unspecified intracranial injury with loss of consciousness of 1 hour to 5 hours 59 minutes, sequela: Secondary | ICD-10-CM | POA: Insufficient documentation

## 2018-02-21 DIAGNOSIS — R4189 Other symptoms and signs involving cognitive functions and awareness: Secondary | ICD-10-CM | POA: Diagnosis not present

## 2018-02-21 DIAGNOSIS — I10 Essential (primary) hypertension: Secondary | ICD-10-CM | POA: Insufficient documentation

## 2018-02-21 DIAGNOSIS — F17210 Nicotine dependence, cigarettes, uncomplicated: Secondary | ICD-10-CM | POA: Diagnosis not present

## 2018-02-21 DIAGNOSIS — G4700 Insomnia, unspecified: Secondary | ICD-10-CM | POA: Diagnosis not present

## 2018-02-21 DIAGNOSIS — R5383 Other fatigue: Secondary | ICD-10-CM | POA: Insufficient documentation

## 2018-02-21 MED ORDER — LISINOPRIL 20 MG PO TABS: 20.0000 mg | ORAL_TABLET | Freq: Every day | ORAL | 6 refills | Status: DC
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
## 2018-02-21 NOTE — Patient Instructions (Addendum)
Give appointment with clinical pharmacist in 2 weeks for repeat BP check.  Increase Lisinopril to 20 mg daily.   Try applying for [employer] to help with transportation to appointments.

## 2018-02-22 LAB — COMPREHENSIVE METABOLIC PANEL
ALK PHOS: 66 IU/L (ref 39–117)
ALT: 20 IU/L (ref 0–44)
AST: 21 IU/L (ref 0–40)
Albumin/Globulin Ratio: 1.8 (ref 1.2–2.2)
Albumin: 4.2 g/dL (ref 3.5–5.5)
BUN/Creatinine Ratio: 13 (ref 9–20)
BUN: 18 mg/dL (ref 6–20)
Bilirubin Total: 0.3 mg/dL (ref 0.0–1.2)
CHLORIDE: 103 mmol/L (ref 96–106)
CO2: 22 mmol/L (ref 20–29)
CREATININE: 1.35 mg/dL — AB (ref 0.76–1.27)
Calcium: 9.4 mg/dL (ref 8.7–10.2)
GFR calc Af Amer: 79 mL/min/{1.73_m2} (ref >59)
GFR calc non Af Amer: 68 mL/min/{1.73_m2} (ref >59)
GLUCOSE: 87 mg/dL (ref 65–99)
Globulin, Total: 2.4 g/dL (ref 1.5–4.5)
Potassium: 4.1 mmol/L (ref 3.5–5.2)
Sodium: 140 mmol/L (ref 134–144)
Total Protein: 6.6 g/dL (ref 6.0–8.5)

## 2018-02-22 LAB — CBC
Hematocrit: 42 % (ref 37.5–51.0)
Hemoglobin: 13.8 g/dL (ref 13.0–17.7)
MCH: 32.7 pg (ref 26.6–33.0)
MCHC: 32.9 g/dL (ref 31.5–35.7)
MCV: 100 fL — ABNORMAL HIGH (ref 79–97)
PLATELETS: 204 10*3/uL (ref 150–450)
RBC: 4.22 x10E6/uL (ref 4.14–5.80)
RDW: 12.7 % (ref 12.3–15.4)
WBC: 5.4 10*3/uL (ref 3.4–10.8)

## 2018-03-06 ENCOUNTER — Ambulatory Visit: Payer: Medicaid Other | Attending: Family Medicine | Admitting: Pharmacist

## 2018-03-06 ENCOUNTER — Encounter: Payer: Self-pay | Admitting: Pharmacist

## 2018-03-06 VITALS — BP 127/75 | HR 53

## 2018-03-06 DIAGNOSIS — Z79899 Other long term (current) drug therapy: Secondary | ICD-10-CM | POA: Diagnosis not present

## 2018-03-06 DIAGNOSIS — Z7189 Other specified counseling: Secondary | ICD-10-CM | POA: Diagnosis present

## 2018-03-06 DIAGNOSIS — I10 Essential (primary) hypertension: Secondary | ICD-10-CM | POA: Diagnosis present

## 2018-03-06 NOTE — Progress Notes (Signed)
   S:    PCP: Dr. Laural Benes  Patient arrives in good spirits.  Presents to the clinic for hypertension evaluation, counseling, and management. Patient was referred and last seen by Dr. Laural Benes on 02/21/18.  BP at that visit 150/91. Lisinopril was increased to 20 mg daily.  Patient denies CP, HA, SOB, or blurred vision. Reports adherence with medications. No problems or missed doses.  Current BP Medications include:   - Lisinopril 20 mg daily - Metoprolol 100 mg BID  Antihypertensives tried in the past include:  - Lisinopril 10 mg daily    Dietary habits include:  - "I try to restrict salt" - Denies caffeine; reports drinking water continuously throughout the day Exercise habits include: - Works at [employer]; "lots of walking and lifting" Family / Social history:  - FH: HTN (mother) - Tobacco: 1 pack per day current - Alcohol: denies  Home BP readings:  - no device to check at home  O:  L arm after 5 minutes rest 127/75, HR 75  Last 3 Office BP readings: BP Readings from Last 3 Encounters:  02/21/18 (!) 150/91  01/09/18 132/84  01/07/18 (!) 147/91    BMET    Component Value Date/Time   NA 140 02/21/2018 1003   K 4.1 02/21/2018 1003   CL 103 02/21/2018 1003   CO2 22 02/21/2018 1003   GLUCOSE 87 02/21/2018 1003   GLUCOSE 135 (H) 02/11/2017 0001   BUN 18 02/21/2018 1003   CREATININE 1.35 (H) 02/21/2018 1003   CALCIUM 9.4 02/21/2018 1003   GFRNONAA 68 02/21/2018 1003   GFRAA 79 02/21/2018 1003    Renal function: Estimated Creatinine Clearance: 96.5 mL/min (A) (by C-G formula based on SCr of 1.35 mg/dL (H)).  A/P: Hypertension longstanding currently controlled on current medications. BP Goal <130/80 mmHg. Patient is adherent with current medications.  -Continued anti-hypertensives.  -Counseled on lifestyle modifications for blood pressure control including reduced dietary sodium, increased exercise, adequate sleep  Results reviewed and written information provided.    Total time in face-to-face counseling 15 minutes.   F/U Clinic Visit 06/2018.    Patient seen with: Mosie Lukes, PharmD Candidate [employer] 2021  Butch Penny, PharmD, CPP Clinical Pharmacist [facility] [phone]

## 2018-03-06 NOTE — Patient Instructions (Signed)

## 2018-03-12 ENCOUNTER — Telehealth: Payer: Self-pay | Admitting: *Deleted

## 2018-03-12 DIAGNOSIS — S069X0S Unspecified intracranial injury without loss of consciousness, sequela: Principal | ICD-10-CM

## 2018-03-12 DIAGNOSIS — R4689 Other symptoms and signs involving appearance and behavior: Secondary | ICD-10-CM

## 2018-03-12 NOTE — Telephone Encounter (Signed)
Patient is asking if Dr. Riley Kill could please refer him to a psychiatrist or psychologist.

## 2018-03-13 NOTE — Telephone Encounter (Signed)
Left voicemail stating as well

## 2018-03-13 NOTE — Telephone Encounter (Signed)
Patient called again with the same request

## 2018-03-13 NOTE — Telephone Encounter (Signed)
I made a referral to Dr. Kieth Brightly.

## 2018-04-04 ENCOUNTER — Encounter: Payer: Medicaid Other | Attending: Physical Medicine & Rehabilitation | Admitting: Psychology

## 2018-04-04 DIAGNOSIS — S06309A Unspecified focal traumatic brain injury with loss of consciousness of unspecified duration, initial encounter: Secondary | ICD-10-CM | POA: Diagnosis present

## 2018-04-04 DIAGNOSIS — F1210 Cannabis abuse, uncomplicated: Secondary | ICD-10-CM | POA: Insufficient documentation

## 2018-04-04 DIAGNOSIS — F068 Other specified mental disorders due to known physiological condition: Secondary | ICD-10-CM

## 2018-04-04 DIAGNOSIS — J45909 Unspecified asthma, uncomplicated: Secondary | ICD-10-CM | POA: Diagnosis not present

## 2018-04-04 DIAGNOSIS — M25562 Pain in left knee: Secondary | ICD-10-CM | POA: Diagnosis not present

## 2018-04-04 DIAGNOSIS — R4689 Other symptoms and signs involving appearance and behavior: Secondary | ICD-10-CM

## 2018-04-04 DIAGNOSIS — I10 Essential (primary) hypertension: Secondary | ICD-10-CM | POA: Diagnosis not present

## 2018-04-04 DIAGNOSIS — S062X3S Diffuse traumatic brain injury with loss of consciousness of 1 hour to 5 hours 59 minutes, sequela: Secondary | ICD-10-CM | POA: Diagnosis not present

## 2018-04-04 DIAGNOSIS — G8929 Other chronic pain: Secondary | ICD-10-CM

## 2018-04-04 DIAGNOSIS — S069X0S Unspecified intracranial injury without loss of consciousness, sequela: Secondary | ICD-10-CM

## 2018-04-04 DIAGNOSIS — F1010 Alcohol abuse, uncomplicated: Secondary | ICD-10-CM | POA: Diagnosis not present

## 2018-04-10 ENCOUNTER — Encounter: Payer: Self-pay | Admitting: Psychology

## 2018-04-10 NOTE — Progress Notes (Signed)
Today was a follow-up working on return to work issues.  The patient reports that he has been improving and we worked on strategies for returning to more life functioning.

## 2018-04-18 ENCOUNTER — Ambulatory Visit: Payer: Medicaid Other | Attending: Internal Medicine | Admitting: Physician Assistant

## 2018-04-18 VITALS — BP 137/77 | HR 57 | Temp 98.4°F | Resp 18 | Ht 74.0 in | Wt 212.0 lb

## 2018-04-18 DIAGNOSIS — Z791 Long term (current) use of non-steroidal anti-inflammatories (NSAID): Secondary | ICD-10-CM | POA: Insufficient documentation

## 2018-04-18 DIAGNOSIS — J069 Acute upper respiratory infection, unspecified: Secondary | ICD-10-CM

## 2018-04-18 DIAGNOSIS — Z79899 Other long term (current) drug therapy: Secondary | ICD-10-CM | POA: Diagnosis not present

## 2018-04-18 DIAGNOSIS — J4520 Mild intermittent asthma, uncomplicated: Secondary | ICD-10-CM

## 2018-04-18 DIAGNOSIS — F17200 Nicotine dependence, unspecified, uncomplicated: Secondary | ICD-10-CM | POA: Diagnosis not present

## 2018-04-18 DIAGNOSIS — I10 Essential (primary) hypertension: Secondary | ICD-10-CM | POA: Diagnosis not present

## 2018-04-18 MED ORDER — AZITHROMYCIN 250 MG PO TABS: ORAL_TABLET | ORAL | 0 refills | Status: DC

## 2018-04-18 MED ORDER — BENZONATATE 100 MG PO CAPS: 200.0000 mg | ORAL_CAPSULE | Freq: Three times a day (TID) | ORAL | 0 refills | Status: DC | PRN

## 2018-04-18 MED ORDER — METHYLPREDNISOLONE SODIUM SUCC 125 MG IJ SOLR
125.0000 mg | Freq: Once | INTRAMUSCULAR | Status: AC
  Administered 2018-04-18: 125 mg via INTRAMUSCULAR

## 2018-04-18 MED FILL — AZITHROMYCIN 250 MG TABLET: 250 | 5 days supply | Qty: 6 | Fill #0

## 2018-04-18 NOTE — Progress Notes (Signed)
Jacquel Mccamish, is a 37 y.o. male  ZOX:[id number]  WJX:[id number]  DOB - 07/29/1980  Subjective:  Chief Complaint and HPI: [name] is a 37 y.o. male here today for 2+ week h/o cough and congestion.  OTC not helping.  Hasn't been using inhaler.  No fever.  Phlegm is yellowish and sometimes white.     ROS:   Constitutional:  No f/c, No night sweats, No unexplained weight loss. EENT:  No vision changes, No blurry vision, No hearing changes. No mouth, throat, or ear problems.  Respiratory: + cough, +wheezing Cardiac: No CP, no palpitations GI:  No abd pain, No N/V/D. GU: No Urinary s/sx Musculoskeletal: No joint pain Neuro: No headache, no dizziness, no motor weakness.  Skin: No rash Endocrine:  No polydipsia. No polyuria.  Psych: Denies SI/HI  No problems updated.  ALLERGIES: No Known Allergies  PAST MEDICAL HISTORY: Past Medical History:  Diagnosis Date  . Asthma   . Hypertension   . Seizure (HCC)   . Smoker   . TBI (traumatic brain injury) [facility])     MEDICATIONS AT HOME: Prior to Admission medications   Medication Sig Start Date End Date Taking? Authorizing Provider  albuterol (PROVENTIL HFA;VENTOLIN HFA) 108 (90 Base) MCG/ACT inhaler Inhale 2 puffs into the lungs every 4 (four) hours as needed for wheezing or shortness of breath. 09/26/16   Lizbeth Bark, FNP  azithromycin (ZITHROMAX) 250 MG tablet Take 2 today then 1 daily 04/18/18   Georgian Co M, PA-C  benzonatate (TESSALON) 100 MG capsule Take 2 capsules (200 mg total) by mouth 3 (three) times daily as needed for cough. 04/18/18   Anders Simmonds, PA-C  divalproex (DEPAKOTE) 250 MG DR tablet TAKE 1 TABLET(250 MG) BY MOUTH TWICE DAILY 02/12/18   Ranelle Oyster, MD  ibuprofen (ADVIL,MOTRIN) 600 MG tablet TAKE 1 TABLET BY MOUTH EVERY 8 HOURS AS NEEDED FOR MODERATE PAIN OR CRAMPING. 05/01/17   Lizbeth Bark, FNP  lamoTRIgine (LAMICTAL) 100 MG tablet Take 1 tablet (100 mg total) by mouth 2 (two)  times daily. 03/27/17   Levert Feinstein, MD  lisinopril (PRINIVIL,ZESTRIL) 20 MG tablet Take 1 tablet (20 mg total) by mouth daily. 02/21/18   Marcine Matar, MD  Melatonin 2.5 MG CHEW Use as directed for as needed for sleep. 05/10/17   Lizbeth Bark, FNP  metoprolol tartrate (LOPRESSOR) 100 MG tablet Take 1 tablet (100 mg total) by mouth 2 (two) times daily. 12/27/17   Marcine Matar, MD  polycarbophil (FIBERCON) 625 MG tablet Take 1 tablet (625 mg total) by mouth daily. 09/20/16   Angiulli, Mcarthur Rossetti, PA-C  traZODone (DESYREL) 100 MG tablet TAKE 1 TO 2 TABLETS(100 TO 200 MG) BY MOUTH AT BEDTIME 01/28/18   Ranelle Oyster, MD     Objective:  EXAM:   Vitals:   04/18/18 0934  BP: 137/77  Pulse: (!) 57  Resp: 18  Temp: 98.4 F (36.9 C)  TempSrc: Oral  SpO2: 96%  Weight: 212 lb (96.2 kg)  Height: 6\' 2"  (1.88 m)    General appearance : A&OX3. NAD. Non-toxic-appearing HEENT: Atraumatic and Normocephalic.  PERRLA. EOM intact.  TM clear B. Mouth-MMM, post pharynx WNL w/o erythema, No PND. Neck: supple, no JVD. No cervical lymphadenopathy. No thyromegaly Chest/Lungs:  Breathing-non-labored, fair air entry bilaterally, breath sounds without rales or rhonchi.  There is mild wheezing throughout wheezing  CVS: S1 S2 regular, no murmurs, gallops, rubs  Extremities: Bilateral Lower Ext shows no  edema, both legs are warm to touch with = pulse throughout Neurology:  CN II-XII grossly intact, Non focal.   Psych:  TP linear. J/I WNL. Normal speech. Appropriate eye contact and affect.  Skin:  No Rash  Data Review No results found for: HGBA1C   Assessment & Plan   1. Essential hypertension Controlled-continue current regimen  2. Upper respiratory tract infection, unspecified type Fluids, rest - azithromycin (ZITHROMAX) 250 MG tablet; Take 2 today then 1 daily  Dispense: 6 tablet; Refill: 0 - benzonatate (TESSALON) 100 MG capsule; Take 2 capsules (200 mg total) by mouth 3 (three)  times daily as needed for cough.  Dispense: 40 capsule; Refill: 0  3. Mild intermittent asthma without complication -use albuterol prn directions - methylPREDNISolone sodium succinate (SOLU-MEDROL) 125 mg/2 mL injection 125 mg Smoking cessation encouraged.     Patient have been counseled extensively about nutrition and exercise  Return for keep 12/23 f/up with Dr Laural Benes.  The patient was given clear instructions to go to ER or return to medical center if symptoms don't improve, worsen or new problems develop. The patient verbalized understanding. The patient was told to call to get lab results if they haven't heard anything in the next week.     Georgian Co, PA-C [facility] and [facility] [place], [place] [phone]   04/18/2018, 9:35 AM

## 2018-04-24 ENCOUNTER — Other Ambulatory Visit: Payer: Self-pay | Admitting: Physical Medicine & Rehabilitation

## 2018-04-24 ENCOUNTER — Other Ambulatory Visit: Payer: Self-pay | Admitting: Neurology

## 2018-04-24 DIAGNOSIS — Z79899 Other long term (current) drug therapy: Secondary | ICD-10-CM

## 2018-04-24 DIAGNOSIS — S062X3S Diffuse traumatic brain injury with loss of consciousness of 1 hour to 5 hours 59 minutes, sequela: Secondary | ICD-10-CM

## 2018-04-24 DIAGNOSIS — Z5181 Encounter for therapeutic drug level monitoring: Secondary | ICD-10-CM

## 2018-04-24 DIAGNOSIS — G4701 Insomnia due to medical condition: Secondary | ICD-10-CM

## 2018-04-24 DIAGNOSIS — F068 Other specified mental disorders due to known physiological condition: Secondary | ICD-10-CM

## 2018-04-24 DIAGNOSIS — G894 Chronic pain syndrome: Secondary | ICD-10-CM

## 2018-04-24 DIAGNOSIS — G479 Sleep disorder, unspecified: Secondary | ICD-10-CM

## 2018-04-24 DIAGNOSIS — S069X0S Unspecified intracranial injury without loss of consciousness, sequela: Secondary | ICD-10-CM

## 2018-04-25 ENCOUNTER — Encounter: Payer: Self-pay | Admitting: Psychology

## 2018-04-25 ENCOUNTER — Encounter: Payer: Medicaid Other | Admitting: Psychology

## 2018-04-25 DIAGNOSIS — G479 Sleep disorder, unspecified: Secondary | ICD-10-CM | POA: Diagnosis not present

## 2018-04-25 DIAGNOSIS — R4689 Other symptoms and signs involving appearance and behavior: Secondary | ICD-10-CM | POA: Diagnosis not present

## 2018-04-25 DIAGNOSIS — F068 Other specified mental disorders due to known physiological condition: Secondary | ICD-10-CM

## 2018-04-25 DIAGNOSIS — S062X3S Diffuse traumatic brain injury with loss of consciousness of 1 hour to 5 hours 59 minutes, sequela: Secondary | ICD-10-CM | POA: Diagnosis not present

## 2018-04-25 DIAGNOSIS — S06309A Unspecified focal traumatic brain injury with loss of consciousness of unspecified duration, initial encounter: Secondary | ICD-10-CM | POA: Diagnosis not present

## 2018-04-25 DIAGNOSIS — G894 Chronic pain syndrome: Secondary | ICD-10-CM

## 2018-04-25 DIAGNOSIS — S069X0S Unspecified intracranial injury without loss of consciousness, sequela: Secondary | ICD-10-CM
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
## 2018-05-10 ENCOUNTER — Other Ambulatory Visit: Payer: Self-pay | Admitting: Physical Medicine & Rehabilitation

## 2018-05-10 DIAGNOSIS — R4689 Other symptoms and signs involving appearance and behavior: Secondary | ICD-10-CM

## 2018-05-10 DIAGNOSIS — S069X0S Unspecified intracranial injury without loss of consciousness, sequela: Principal | ICD-10-CM

## 2018-05-15 ENCOUNTER — Encounter: Payer: Medicaid Other | Attending: Physical Medicine & Rehabilitation | Admitting: Physical Medicine & Rehabilitation

## 2018-05-15 ENCOUNTER — Other Ambulatory Visit: Payer: Self-pay

## 2018-05-15 ENCOUNTER — Encounter: Payer: Self-pay | Admitting: Physical Medicine & Rehabilitation

## 2018-05-15 VITALS — BP 154/93 | HR 48 | Ht 74.0 in | Wt 211.8 lb

## 2018-05-15 DIAGNOSIS — F1010 Alcohol abuse, uncomplicated: Secondary | ICD-10-CM | POA: Diagnosis not present

## 2018-05-15 DIAGNOSIS — I10 Essential (primary) hypertension: Secondary | ICD-10-CM | POA: Diagnosis not present

## 2018-05-15 DIAGNOSIS — F1210 Cannabis abuse, uncomplicated: Secondary | ICD-10-CM | POA: Diagnosis not present

## 2018-05-15 DIAGNOSIS — S069X0S Unspecified intracranial injury without loss of consciousness, sequela: Secondary | ICD-10-CM

## 2018-05-15 DIAGNOSIS — S062X3S Diffuse traumatic brain injury with loss of consciousness of 1 hour to 5 hours 59 minutes, sequela: Secondary | ICD-10-CM

## 2018-05-15 DIAGNOSIS — S06309A Unspecified focal traumatic brain injury with loss of consciousness of unspecified duration, initial encounter: Secondary | ICD-10-CM | POA: Diagnosis not present

## 2018-05-15 DIAGNOSIS — R4689 Other symptoms and signs involving appearance and behavior: Secondary | ICD-10-CM | POA: Diagnosis not present

## 2018-05-15 DIAGNOSIS — F068 Other specified mental disorders due to known physiological condition: Secondary | ICD-10-CM

## 2018-05-15 DIAGNOSIS — J45909 Unspecified asthma, uncomplicated: Secondary | ICD-10-CM | POA: Insufficient documentation
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
## 2018-05-15 NOTE — Patient Instructions (Signed)
PLEASE FEEL FREE TO CALL OUR OFFICE WITH ANY PROBLEMS OR QUESTIONS ([phone])      

## 2018-06-07 ENCOUNTER — Other Ambulatory Visit: Payer: Self-pay | Admitting: Physical Medicine & Rehabilitation

## 2018-06-07 DIAGNOSIS — S069X0S Unspecified intracranial injury without loss of consciousness, sequela: Principal | ICD-10-CM

## 2018-06-07 DIAGNOSIS — R4689 Other symptoms and signs involving appearance and behavior: Secondary | ICD-10-CM

## 2018-06-24 ENCOUNTER — Ambulatory Visit: Payer: Medicaid Other | Admitting: Internal Medicine

## 2018-06-27 ENCOUNTER — Encounter: Payer: Self-pay | Admitting: Psychology

## 2018-06-27 ENCOUNTER — Encounter

## 2018-06-27 ENCOUNTER — Encounter: Payer: Medicaid Other | Attending: Physical Medicine & Rehabilitation | Admitting: Psychology

## 2018-06-27 DIAGNOSIS — J45909 Unspecified asthma, uncomplicated: Secondary | ICD-10-CM | POA: Insufficient documentation

## 2018-06-27 DIAGNOSIS — F1010 Alcohol abuse, uncomplicated: Secondary | ICD-10-CM | POA: Insufficient documentation

## 2018-06-27 DIAGNOSIS — G894 Chronic pain syndrome: Secondary | ICD-10-CM

## 2018-06-27 DIAGNOSIS — S062X3S Diffuse traumatic brain injury with loss of consciousness of 1 hour to 5 hours 59 minutes, sequela: Secondary | ICD-10-CM | POA: Diagnosis not present

## 2018-06-27 DIAGNOSIS — S06309A Unspecified focal traumatic brain injury with loss of consciousness of unspecified duration, initial encounter: Secondary | ICD-10-CM | POA: Diagnosis present

## 2018-06-27 DIAGNOSIS — I10 Essential (primary) hypertension: Secondary | ICD-10-CM | POA: Diagnosis not present

## 2018-06-27 DIAGNOSIS — F1210 Cannabis abuse, uncomplicated: Secondary | ICD-10-CM | POA: Insufficient documentation

## 2018-06-27 DIAGNOSIS — R451 Restlessness and agitation: Secondary | ICD-10-CM | POA: Diagnosis not present

## 2018-06-27 DIAGNOSIS — F068 Other specified mental disorders due to known physiological condition: Secondary | ICD-10-CM

## 2018-06-27 DIAGNOSIS — S069X0S Unspecified intracranial injury without loss of consciousness, sequela: Secondary | ICD-10-CM

## 2018-06-27 DIAGNOSIS — R4689 Other symptoms and signs involving appearance and behavior: Secondary | ICD-10-CM
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
## 2018-07-01 ENCOUNTER — Ambulatory Visit: Payer: Medicaid Other | Admitting: Internal Medicine

## 2018-07-08 ENCOUNTER — Other Ambulatory Visit: Payer: Self-pay | Admitting: Physical Medicine & Rehabilitation

## 2018-07-08 DIAGNOSIS — S062X3S Diffuse traumatic brain injury with loss of consciousness of 1 hour to 5 hours 59 minutes, sequela: Secondary | ICD-10-CM

## 2018-07-08 DIAGNOSIS — G4701 Insomnia due to medical condition: Secondary | ICD-10-CM

## 2018-07-08 DIAGNOSIS — Z79899 Other long term (current) drug therapy: Secondary | ICD-10-CM

## 2018-07-08 DIAGNOSIS — F068 Other specified mental disorders due to known physiological condition: Secondary | ICD-10-CM

## 2018-07-08 DIAGNOSIS — S069X0S Unspecified intracranial injury without loss of consciousness, sequela: Secondary | ICD-10-CM

## 2018-07-08 DIAGNOSIS — G479 Sleep disorder, unspecified: Secondary | ICD-10-CM

## 2018-07-08 DIAGNOSIS — Z5181 Encounter for therapeutic drug level monitoring: Secondary | ICD-10-CM

## 2018-07-08 DIAGNOSIS — G894 Chronic pain syndrome: Secondary | ICD-10-CM
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
## 2018-07-23 ENCOUNTER — Ambulatory Visit: Payer: Medicaid Other | Admitting: Nurse Practitioner

## 2018-07-23 ENCOUNTER — Telehealth: Payer: Self-pay | Admitting: *Deleted

## 2018-07-23 ENCOUNTER — Other Ambulatory Visit: Payer: Self-pay | Admitting: Internal Medicine

## 2018-07-23 DIAGNOSIS — I10 Essential (primary) hypertension: Secondary | ICD-10-CM

## 2018-07-23 NOTE — Telephone Encounter (Signed)
Patient was no show for follow up with NP today.  

## 2018-07-24 ENCOUNTER — Encounter: Payer: Self-pay | Admitting: Nurse Practitioner

## 2018-07-25 ENCOUNTER — Encounter: Payer: Medicaid Other | Admitting: Psychology

## 2018-07-25 ENCOUNTER — Ambulatory Visit: Payer: Medicaid Other | Attending: Internal Medicine | Admitting: Physician Assistant

## 2018-07-25 VITALS — BP 140/79 | HR 66 | Temp 97.9°F | Ht 74.0 in | Wt 205.0 lb

## 2018-07-25 DIAGNOSIS — R05 Cough: Secondary | ICD-10-CM | POA: Diagnosis present

## 2018-07-25 DIAGNOSIS — J4520 Mild intermittent asthma, uncomplicated: Secondary | ICD-10-CM | POA: Insufficient documentation

## 2018-07-25 DIAGNOSIS — I10 Essential (primary) hypertension: Secondary | ICD-10-CM | POA: Insufficient documentation

## 2018-07-25 DIAGNOSIS — Z87820 Personal history of traumatic brain injury: Secondary | ICD-10-CM | POA: Insufficient documentation

## 2018-07-25 DIAGNOSIS — Z79899 Other long term (current) drug therapy: Secondary | ICD-10-CM | POA: Insufficient documentation

## 2018-07-25 DIAGNOSIS — J069 Acute upper respiratory infection, unspecified: Secondary | ICD-10-CM | POA: Diagnosis not present

## 2018-07-25 MED ORDER — ALBUTEROL SULFATE HFA 108 (90 BASE) MCG/ACT IN AERS: 2.0000 | INHALATION_SPRAY | RESPIRATORY_TRACT | 2 refills | Status: DC | PRN

## 2018-07-25 MED ORDER — BENZONATATE 100 MG PO CAPS: 200.0000 mg | ORAL_CAPSULE | Freq: Three times a day (TID) | ORAL | 0 refills | Status: DC | PRN

## 2018-07-25 MED ORDER — AZITHROMYCIN 250 MG PO TABS: ORAL_TABLET | ORAL | 0 refills | Status: DC

## 2018-07-25 NOTE — Progress Notes (Signed)
Patient ID: [name], male   DOB: [birth year], 38 y.o.   MRN: [id number]   [name], is a 38 y.o. male  GSU:[id number]  OPF:[id number]  DOB - [birth year]  Subjective:  Chief Complaint and HPI: [name] is a 38 y.o. male here today with about 10 day h/o cough productive of yellowish green mucus.   Not getting any better.  +some sinus congestion.  No fever.  OTCs not helping.  Needs new inhaler.  No GI s/sx.     ROS:   Constitutional:  No f/c, No night sweats, No unexplained weight loss. EENT:  No vision changes, No blurry vision, No hearing changes. No mouth, throat, or ear problems.  Respiratory: + cough, No SOB Cardiac: No CP, no palpitations GI:  No abd pain, No N/V/D. GU: No Urinary s/sx Musculoskeletal: No joint pain Neuro: No headache, no dizziness, no motor weakness.  Skin: No rash Endocrine:  No polydipsia. No polyuria.  Psych: Denies SI/HI  No problems updated.  ALLERGIES: No Known Allergies  PAST MEDICAL HISTORY: Past Medical History:  Diagnosis Date  . Asthma   . Hypertension   . Seizure (HCC)   . Smoker   . TBI (traumatic brain injury) [facility])     MEDICATIONS AT HOME: Prior to Admission medications   Medication Sig Start Date End Date Taking? Authorizing Provider  albuterol (PROVENTIL HFA;VENTOLIN HFA) 108 (90 Base) MCG/ACT inhaler Inhale 2 puffs into the lungs every 4 (four) hours as needed for wheezing or shortness of breath. 07/25/18  Yes Gean Laursen M, PA-C  divalproex (DEPAKOTE) 250 MG DR tablet TAKE 1 TABLET(250 MG) BY MOUTH TWICE DAILY 06/07/18  Yes Ranelle Oyster, MD  ibuprofen (ADVIL,MOTRIN) 600 MG tablet TAKE 1 TABLET BY MOUTH EVERY 8 HOURS AS NEEDED FOR MODERATE PAIN OR CRAMPING. 05/01/17  Yes Hairston, [name], FNP  lamoTRIgine (LAMICTAL) 100 MG tablet TAKE 1 TABLET BY MOUTH TWICE A DAY 04/24/18  Yes Levert Feinstein, MD  lisinopril (PRINIVIL,ZESTRIL) 20 MG tablet Take 1 tablet (20 mg total) by mouth daily. 02/21/18  Yes Marcine Matar, MD  Melatonin 2.5 MG CHEW Use as directed for as needed for sleep. 05/10/17  Yes Hairston, Oren Beckmann, FNP  metoprolol tartrate (LOPRESSOR) 100 MG tablet Take 1 tablet (100 mg total) by mouth 2 (two) times daily. MUST MAKE APPT FOR FURTHER REFILLS 07/23/18  Yes Marcine Matar, MD  traZODone (DESYREL) 100 MG tablet TAKE 1 TO 2 TABLETS(100 TO 200 MG) BY MOUTH AT BEDTIME 07/08/18  Yes Ranelle Oyster, MD  azithromycin [facility]) 250 MG tablet Take 2 today then 1 daily 07/25/18   Anders Simmonds, PA-C  benzonatate (TESSALON) 100 MG capsule Take 2 capsules (200 mg total) by mouth 3 (three) times daily as needed for cough. 07/25/18   Anders Simmonds, PA-C  polycarbophil (FIBERCON) 625 MG tablet Take 1 tablet (625 mg total) by mouth daily. Patient not taking: Reported on 07/25/2018 09/20/16   Charlton Amor, PA-C     Objective:  EXAM:   Vitals:   07/25/18 1011  BP: 140/79  Pulse: 66  Temp: 97.9 F (36.6 C)  TempSrc: Oral  SpO2: 97%  Weight: 205 lb (93 kg)  Height: 6\' 2"  (1.88 m)    General appearance : A&OX3. NAD. Non-toxic-appearing HEENT: Atraumatic and Normocephalic.  PERRLA. EOM intact.  TM full/congested B. Mouth-MMM, post pharynx WNL w/o erythema, + PND. Neck: supple, no JVD. No cervical lymphadenopathy. No thyromegaly Chest/Lungs:  Breathing-non-labored, Good  air entry bilaterally, breath sounds without rales or rhonchi.  There is mild wheezing  CVS: S1 S2 regular, no murmurs, gallops, rubs  Abdomen: Bowel sounds present, Non tender and not distended with no gaurding, rigidity or rebound. Extremities: Bilateral Lower Ext shows no edema, both legs are warm to touch with = pulse throughout Neurology:  CN II-XII grossly intact, Non focal.   Psych:  TP linear. J/I WNL. Normal speech. Appropriate eye contact and blunted affect.  Skin:  No Rash  Data Review No results found for: HGBA1C   Assessment & Plan   1. Upper respiratory tract infection, unspecified type Will  cover for atypicals - azithromycin (ZITHROMAX) 250 MG tablet; Take 2 today then 1 daily  Dispense: 6 tablet; Refill: 0 - benzonatate (TESSALON) 100 MG capsule; Take 2 capsules (200 mg total) by mouth 3 (three) times daily as needed for cough.  Dispense: 40 capsule; Refill: 0  2. Mild intermittent asthma without complication - albuterol (PROVENTIL HFA;VENTOLIN HFA) 108 (90 Base) MCG/ACT inhaler; Inhale 2 puffs into the lungs every 4 (four) hours as needed for wheezing or shortness of breath.  Dispense: 1 Inhaler; Refill: 2  3. Essential hypertension suboptimal control.  We have discussed target BP range and blood pressure goal. I have advised patient to check BP regularly and to call us back or report to clinic if the numbers are consistently higher than 140/90. We discussed the importance of compliance with medical therapy and DASH diet recommended, consequences of uncontrolled hypertension discussed.        Patient have been counseled extensively about nutrition and exercise  Return in about 3 months (around 10/24/2018) for Dr Laural BenesJohnson BP/labs.  The patient was given clear instructions to go to ER or return to medical center if symptoms don't improve, worsen or new problems develop. The patient verbalized understanding. The patient was told to call to get lab results if they haven't heard anything in the next week.     Georgian CoAngela Danial Hlavac, PA-C [facility] [place], [place] [phone]   07/25/2018, 10:29 AM

## 2018-07-25 NOTE — Progress Notes (Signed)
Patient has had cough for 1 week with  Runny nose at times.

## 2018-08-23 ENCOUNTER — Encounter: Payer: Medicaid Other | Admitting: Psychology

## 2018-08-29 ENCOUNTER — Other Ambulatory Visit: Payer: Self-pay | Admitting: Internal Medicine

## 2018-08-29 DIAGNOSIS — I10 Essential (primary) hypertension: Secondary | ICD-10-CM

## 2018-09-10 ENCOUNTER — Encounter: Payer: Self-pay | Admitting: Psychology

## 2018-10-10 ENCOUNTER — Encounter: Payer: Medicaid Other | Admitting: Psychology

## 2018-10-28 ENCOUNTER — Ambulatory Visit: Payer: Self-pay | Admitting: Internal Medicine

## 2018-11-13 ENCOUNTER — Encounter: Payer: Self-pay | Admitting: Physical Medicine & Rehabilitation

## 2018-12-03 ENCOUNTER — Ambulatory Visit: Payer: Self-pay | Admitting: Psychology

## 2018-12-03 ENCOUNTER — Encounter

## 2019-01-16 ENCOUNTER — Encounter: Payer: Self-pay | Admitting: Psychology

## 2019-04-03 ENCOUNTER — Ambulatory Visit: Payer: Medicaid Other | Attending: Family Medicine | Admitting: Physician Assistant

## 2019-04-03 ENCOUNTER — Other Ambulatory Visit: Payer: Self-pay

## 2019-04-03 DIAGNOSIS — G479 Sleep disorder, unspecified: Secondary | ICD-10-CM

## 2019-04-03 DIAGNOSIS — I10 Essential (primary) hypertension: Secondary | ICD-10-CM | POA: Diagnosis not present

## 2019-04-03 DIAGNOSIS — M25531 Pain in right wrist: Secondary | ICD-10-CM | POA: Diagnosis not present

## 2019-04-03 DIAGNOSIS — G4701 Insomnia due to medical condition: Secondary | ICD-10-CM

## 2019-04-03 MED ORDER — LISINOPRIL 20 MG PO TABS: 20.0000 mg | ORAL_TABLET | Freq: Every day | ORAL | 6 refills | Status: DC

## 2019-04-03 MED ORDER — METOPROLOL TARTRATE 100 MG PO TABS: ORAL_TABLET | ORAL | 0 refills | Status: DC

## 2019-04-03 MED ORDER — NAPROXEN 500 MG PO TABS: 500.0000 mg | ORAL_TABLET | Freq: Two times a day (BID) | ORAL | 0 refills | Status: DC

## 2019-04-03 MED ORDER — TRAZODONE HCL 100 MG PO TABS: ORAL_TABLET | ORAL | 1 refills | Status: DC

## 2019-04-03 NOTE — Progress Notes (Signed)
Virtual Visit via Telephone Note  I connected with [name] on 04/03/19 at  9:10 AM EDT by telephone and verified that I am speaking with the correct person using two identifiers.   I discussed the limitations, risks, security and privacy concerns of performing an evaluation and management service by telephone and the availability of in person appointments. I also discussed with the patient that there may be a patient responsible charge related to this service. The patient expressed understanding and agreed to proceed.  Patient location:  home My Location:  home office Persons on the call:  Me and the patient.  History of Present Illness: R wrist pain X 2 weeks.  NKI.  No h/o gout.  R hand dominant.  Not red.  No fever.  Hurts worse during the day.  No new repetitive activity.    No HA/CP/dizziness/SOB.  Says BP is controlled OOO.  Needs RF on that and sleep meds.      Observations/Objective:  NAD.  Sleepy, I had to call him 3 times to get him awake enough to talk   Assessment and Plan: 1. Essential hypertension - metoprolol tartrate (LOPRESSOR) 100 MG tablet; TAKE 1 TABLET(100 MG) BY MOUTH TWICE DAILY  Dispense: 60 tablet; Refill: 0 - lisinopril (ZESTRIL) 20 MG tablet; Take 1 tablet (20 mg total) by mouth daily.  Dispense: 30 tablet; Refill: 6  2. Insomnia due to medical condition  - traZODone (DESYREL) 100 MG tablet; TAKE 1 TO 2 TABLETS(100 TO 200 MG) BY MOUTH AT BEDTIME  Dispense: 180 tablet; Refill: 1  3. Sleep disturbance- traZODone (DESYREL) 100 MG tablet; TAKE 1 TO 2 TABLETS(100 TO 200 MG) BY MOUTH AT BEDTIME  Dispense: 180 tablet; Refill: 1  4. Right wrist pain - naproxen (NAPROSYN) 500 MG tablet; Take 1 tablet (500 mg total) by mouth 2 (two) times daily with a meal.  Dispense: 60 tablet; Refill: 0 - DG Wrist 2 Views Right; Future     Follow Up Instructions: 1-2 months with PCP   I discussed the assessment and treatment plan with the patient. The patient was  provided an opportunity to ask questions and all were answered. The patient agreed with the plan and demonstrated an understanding of the instructions.   The patient was advised to call back or seek an in-person evaluation if the symptoms worsen or if the condition fails to improve as anticipated.  I provided 13 minutes of non-face-to-face time during this encounter.   Freeman Caldron, PA-C

## 2019-04-28 ENCOUNTER — Other Ambulatory Visit: Payer: Self-pay | Admitting: Physician Assistant

## 2019-04-28 DIAGNOSIS — I10 Essential (primary) hypertension: Secondary | ICD-10-CM

## 2019-04-28 DIAGNOSIS — M25531 Pain in right wrist: Secondary | ICD-10-CM

## 2019-10-09 ENCOUNTER — Emergency Department (HOSPITAL_COMMUNITY)
Admission: EM | Admit: 2019-10-09 | Discharge: 2019-10-10 | Disposition: A | Discharge status: Home / Self Care | Payer: No Typology Code available for payment source | Attending: Emergency Medicine | Admitting: Emergency Medicine

## 2019-10-09 ENCOUNTER — Encounter (HOSPITAL_COMMUNITY): Payer: Self-pay

## 2019-10-09 ENCOUNTER — Other Ambulatory Visit: Payer: Self-pay

## 2019-10-09 DIAGNOSIS — F17210 Nicotine dependence, cigarettes, uncomplicated: Secondary | ICD-10-CM | POA: Insufficient documentation

## 2019-10-09 DIAGNOSIS — S0121XA Laceration without foreign body of nose, initial encounter: Secondary | ICD-10-CM | POA: Diagnosis not present

## 2019-10-09 DIAGNOSIS — S1191XA Laceration without foreign body of unspecified part of neck, initial encounter: Secondary | ICD-10-CM | POA: Diagnosis not present

## 2019-10-09 DIAGNOSIS — Y9289 Other specified places as the place of occurrence of the external cause: Secondary | ICD-10-CM | POA: Diagnosis not present

## 2019-10-09 DIAGNOSIS — Z79899 Other long term (current) drug therapy: Secondary | ICD-10-CM | POA: Diagnosis not present

## 2019-10-09 DIAGNOSIS — Z87820 Personal history of traumatic brain injury: Secondary | ICD-10-CM | POA: Diagnosis not present

## 2019-10-09 DIAGNOSIS — Z23 Encounter for immunization: Secondary | ICD-10-CM | POA: Insufficient documentation

## 2019-10-09 DIAGNOSIS — I10 Essential (primary) hypertension: Secondary | ICD-10-CM | POA: Diagnosis not present

## 2019-10-09 DIAGNOSIS — Y990 Civilian activity done for income or pay: Secondary | ICD-10-CM | POA: Insufficient documentation

## 2019-10-09 DIAGNOSIS — W208XXA Other cause of strike by thrown, projected or falling object, initial encounter: Secondary | ICD-10-CM | POA: Diagnosis not present

## 2019-10-09 DIAGNOSIS — Y9389 Activity, other specified: Secondary | ICD-10-CM | POA: Insufficient documentation

## 2019-10-09 DIAGNOSIS — S0181XA Laceration without foreign body of other part of head, initial encounter: Secondary | ICD-10-CM
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
## 2019-10-10 ENCOUNTER — Emergency Department (HOSPITAL_COMMUNITY): Payer: No Typology Code available for payment source

## 2019-10-10 LAB — I-STAT CHEM 8, ED
BUN: 10 mg/dL (ref 6–20)
Calcium, Ion: 1.14 mmol/L — ABNORMAL LOW (ref 1.15–1.40)
Chloride: 104 mmol/L (ref 98–111)
Creatinine, Ser: 1.1 mg/dL (ref 0.61–1.24)
Glucose, Bld: 136 mg/dL — ABNORMAL HIGH (ref 70–99)
HCT: 42 % (ref 39.0–52.0)
Hemoglobin: 14.3 g/dL (ref 13.0–17.0)
Potassium: 3.6 mmol/L (ref 3.5–5.1)
Sodium: 139 mmol/L (ref 135–145)
TCO2: 29 mmol/L (ref 22–32)

## 2019-10-10 MED ORDER — OXYCODONE-ACETAMINOPHEN 5-325 MG PO TABS: 1.0000 | ORAL_TABLET | Freq: Once | ORAL | Status: DC

## 2019-10-10 MED ORDER — IOHEXOL 350 MG/ML SOLN
100.0000 mL | Freq: Once | INTRAVENOUS | Status: AC | PRN
  Administered 2019-10-10: 100 mL via INTRAVENOUS

## 2019-10-10 MED ORDER — SODIUM CHLORIDE 0.9 % IV BOLUS
1000.0000 mL | Freq: Once | INTRAVENOUS | Status: AC
  Administered 2019-10-10: 1000 mL via INTRAVENOUS

## 2019-10-10 MED ORDER — TETANUS-DIPHTH-ACELL PERTUSSIS 5-2.5-18.5 LF-MCG/0.5 IM SUSP
0.5000 mL | Freq: Once | INTRAMUSCULAR | Status: AC
  Administered 2019-10-10: 0.5 mL via INTRAMUSCULAR
  Filled 2019-10-10: qty 0.5

## 2019-10-10 MED ORDER — LIDOCAINE-EPINEPHRINE (PF) 2 %-1:200000 IJ SOLN
10.0000 mL | Freq: Once | INTRAMUSCULAR | Status: AC
  Administered 2019-10-10: 10 mL
  Filled 2019-10-10: qty 20

## 2019-10-10 NOTE — ED Notes (Addendum)
PA at bedside to suture lacerations

## 2019-10-10 NOTE — ED Notes (Signed)
Pt taken to CT scan.

## 2019-10-10 NOTE — ED Notes (Signed)
Dr. Blinda Leatherwood at bedside Wound irrigation completed
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
## 2019-10-10 NOTE — ED Notes (Signed)
Patient verbalizes understanding of discharge instructions and follow up care. Opportunity for questioning and answers were provided. All questions answered completely. PIV removed, catheter intact. Site dressed with gauze and tape. Armband removed by staff, pt discharged from ED. Ambulatory from ED with strong, steady gait 

## 2019-10-10 NOTE — ED Provider Notes (Signed)
LACERATION REPAIR Performed by: Arnoldo Hooker Authorized by: Arnoldo Hooker Consent: Verbal consent obtained. Risks and benefits: risks, benefits and alternatives were discussed Consent given by: patient Patient identity confirmed: provided demographic data Prepped and Draped in normal sterile fashion Wound explored  Laceration Location: nose  Laceration Length: 1 cm  No Foreign Bodies seen or palpated  Anesthesia: local infiltration  Local anesthetic: lidocaine 2% w/epinephrine  Anesthetic total: 1 ml  Irrigation method: syringe Amount of cleaning: standard  Skin closure: 6-0 ethilon  Number of sutures: 4  Technique: running  Patient tolerance: Patient tolerated the procedure well with no immediate complications.   LACERATION REPAIR Performed by: Arnoldo Hooker Authorized by: Arnoldo Hooker Consent: Verbal consent obtained. Risks and benefits: risks, benefits and alternatives were discussed Consent given by: patient Patient identity confirmed: provided demographic data Prepped and Draped in normal sterile fashion Wound explored  Laceration Location: anterior  Laceration Length: 2cm  No Foreign Bodies seen or palpated  Anesthesia: local infiltration  Local anesthetic: lidocaine 2% w/epinephrine  Anesthetic total: 2 ml  Irrigation method: syringe Amount of cleaning: standard  Skin closure: 4-0 ethilon  Number of sutures: 3  Technique: simple interrupted **ties left long for easy removal in bearded area  Patient tolerance: Patient tolerated the procedure well with no immediate complications.    Elpidio Anis, PA-C 10/10/19 0518    Gilda Crease, MD 10/10/19 [phone]

## 2019-10-10 NOTE — ED Notes (Signed)
PIV initiated, 20 G to R forearm. IV flushes with 10 cc NS without s/s of infiltration. Positive blood return noted. Secured with tape and tegaderm. Fluid bolus initiated and tetanus shot given per MAR. Name/DOB verified with pt

## 2019-10-13 ENCOUNTER — Ambulatory Visit: Payer: Self-pay | Admitting: Internal Medicine

## 2019-10-13 ENCOUNTER — Other Ambulatory Visit: Payer: Self-pay

## 2019-10-15 ENCOUNTER — Telehealth: Payer: Self-pay | Admitting: Internal Medicine

## 2019-10-15 NOTE — Telephone Encounter (Signed)
Made pt aware when he had an appointment with pcp for a hospital f/u but he was at the wrong office due to hospital f/u was workers comp

## 2019-11-05 ENCOUNTER — Other Ambulatory Visit: Payer: Self-pay | Admitting: Physician Assistant

## 2019-11-05 DIAGNOSIS — G4701 Insomnia due to medical condition: Secondary | ICD-10-CM

## 2019-11-05 DIAGNOSIS — G479 Sleep disorder, unspecified: Secondary | ICD-10-CM

## 2019-12-05 ENCOUNTER — Other Ambulatory Visit: Payer: Self-pay | Admitting: Physician Assistant

## 2019-12-05 DIAGNOSIS — G479 Sleep disorder, unspecified: Secondary | ICD-10-CM

## 2019-12-05 DIAGNOSIS — G4701 Insomnia due to medical condition: Secondary | ICD-10-CM

## 2019-12-15 ENCOUNTER — Telehealth: Payer: Self-pay | Admitting: Internal Medicine

## 2019-12-15 NOTE — Telephone Encounter (Signed)
Patient called and requested for listed medication to be refilled and sent to [id number] - [place], [place] - [address] AT [facility] [address]  [address], [place] [id number]  traZODone (DESYREL) 100 MG tablet [[id number]]

## 2019-12-15 NOTE — Telephone Encounter (Signed)
Patient needs an appointment

## 2020-01-13 ENCOUNTER — Other Ambulatory Visit: Payer: Self-pay | Admitting: Physician Assistant

## 2020-01-13 DIAGNOSIS — G479 Sleep disorder, unspecified: Secondary | ICD-10-CM

## 2020-01-13 DIAGNOSIS — G4701 Insomnia due to medical condition: Secondary | ICD-10-CM

## 2020-01-13 NOTE — Telephone Encounter (Signed)
Requested medication (s) are due for refill today:yes  Requested medication (s) are on the active medication list:  yes  Last refill:  10/11/2019  Future visit scheduled: no  Notes to clinic:  patient is aware that he needs appointment    Requested Prescriptions  Pending Prescriptions Disp Refills   traZODone (DESYREL) 100 MG tablet [Pharmacy Med Name: TRAZODONE 100MG  TABLETS] 180 tablet 1    Sig: TAKE 1 TO 2 TABLETS(100 TO 200 MG) BY MOUTH AT BEDTIME      Psychiatry: Antidepressants - Serotonin Modulator Failed - 01/13/2020 12:15 PM      Failed - Valid encounter within last 6 months    Recent Outpatient Visits           9 months ago Right wrist pain   [facility] [place], [place], [place]   1 year ago Essential hypertension   [facility] [facility] [place], [place], [place]   1 year ago Essential hypertension   [place] [facility] [place], [place], [place]   1 year ago Essential hypertension   [facility] [place], [place]   2 years ago Essential hypertension   [facility], [place]

## 2020-01-26 ENCOUNTER — Encounter: Payer: Self-pay | Admitting: Internal Medicine

## 2020-01-26 ENCOUNTER — Other Ambulatory Visit: Payer: Self-pay

## 2020-01-26 ENCOUNTER — Ambulatory Visit: Payer: BC Managed Care – PPO | Attending: Internal Medicine | Admitting: Internal Medicine

## 2020-01-26 VITALS — BP 155/95 | HR 66 | Resp 16 | Ht 72.5 in | Wt 196.2 lb

## 2020-01-26 DIAGNOSIS — F17200 Nicotine dependence, unspecified, uncomplicated: Secondary | ICD-10-CM | POA: Diagnosis not present

## 2020-01-26 DIAGNOSIS — I10 Essential (primary) hypertension: Secondary | ICD-10-CM

## 2020-01-26 DIAGNOSIS — G479 Sleep disorder, unspecified: Secondary | ICD-10-CM | POA: Diagnosis not present

## 2020-01-26 MED ORDER — METOPROLOL TARTRATE 100 MG PO TABS: 100.0000 mg | ORAL_TABLET | Freq: Two times a day (BID) | ORAL | 6 refills | Status: DC

## 2020-01-26 MED ORDER — TRAZODONE HCL 100 MG PO TABS: 100.0000 mg | ORAL_TABLET | Freq: Every day | ORAL | 5 refills | Status: DC

## 2020-01-26 MED ORDER — LISINOPRIL 20 MG PO TABS: 20.0000 mg | ORAL_TABLET | Freq: Every day | ORAL | 6 refills | Status: DC

## 2020-01-26 NOTE — Patient Instructions (Signed)
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
## 2020-01-27 LAB — COMPREHENSIVE METABOLIC PANEL
ALT: 24 IU/L (ref 0–44)
AST: 26 IU/L (ref 0–40)
Albumin/Globulin Ratio: 2 (ref 1.2–2.2)
Albumin: 4.7 g/dL (ref 4.0–5.0)
Alkaline Phosphatase: 91 IU/L (ref 48–121)
BUN/Creatinine Ratio: 12 (ref 9–20)
BUN: 15 mg/dL (ref 6–20)
Bilirubin Total: 0.5 mg/dL (ref 0.0–1.2)
CO2: 24 mmol/L (ref 20–29)
Calcium: 9.2 mg/dL (ref 8.7–10.2)
Chloride: 103 mmol/L (ref 96–106)
Creatinine, Ser: 1.25 mg/dL (ref 0.76–1.27)
GFR calc Af Amer: 85 mL/min/{1.73_m2} (ref >59)
GFR calc non Af Amer: 74 mL/min/{1.73_m2} (ref >59)
Globulin, Total: 2.4 g/dL (ref 1.5–4.5)
Glucose: 86 mg/dL (ref 65–99)
Potassium: 4.3 mmol/L (ref 3.5–5.2)
Sodium: 141 mmol/L (ref 134–144)
Total Protein: 7.1 g/dL (ref 6.0–8.5)

## 2020-01-27 LAB — CBC WITH DIFFERENTIAL
Basophils Absolute: 0 10*3/uL (ref 0.0–0.2)
Basos: 1 %
EOS (ABSOLUTE): 0.1 10*3/uL (ref 0.0–0.4)
Eos: 2 %
Hematocrit: 43.9 % (ref 37.5–51.0)
Hemoglobin: 14.4 g/dL (ref 13.0–17.7)
Immature Grans (Abs): 0 10*3/uL (ref 0.0–0.1)
Immature Granulocytes: 0 %
Lymphocytes Absolute: 1.4 10*3/uL (ref 0.7–3.1)
Lymphs: 32 %
MCH: 32.6 pg (ref 26.6–33.0)
MCHC: 32.8 g/dL (ref 31.5–35.7)
MCV: 99 fL — ABNORMAL HIGH (ref 79–97)
Monocytes Absolute: 0.6 10*3/uL (ref 0.1–0.9)
Monocytes: 13 %
Neutrophils Absolute: 2.3 10*3/uL (ref 1.4–7.0)
Neutrophils: 52 %
RBC: 4.42 x10E6/uL (ref 4.14–5.80)
RDW: 11.5 % — ABNORMAL LOW (ref 11.6–15.4)
WBC: 4.5 10*3/uL (ref 3.4–10.8)

## 2020-01-27 LAB — LIPID PANEL
Chol/HDL Ratio: 2.5 ratio (ref 0.0–5.0)
Cholesterol, Total: 156 mg/dL (ref 100–199)
HDL: 62 mg/dL (ref >39)
LDL Chol Calc (NIH): 84 mg/dL (ref 0–99)
Triglycerides: 46 mg/dL (ref 0–149)
VLDL Cholesterol Cal: 10 mg/dL (ref 5–40)

## 2020-01-27 NOTE — Progress Notes (Signed)
Let patient know that his blood count is normal.  Kidney and liver function tests normal.  Cholesterol levels normal.

## 2020-01-28 ENCOUNTER — Telehealth: Payer: Self-pay

## 2020-01-28 NOTE — Telephone Encounter (Signed)
Triage nurse tried to contact pt to go over lab results pt didn't answer vm was left  

## 2020-05-31 ENCOUNTER — Ambulatory Visit: Payer: BC Managed Care – PPO | Attending: Internal Medicine | Admitting: Internal Medicine

## 2020-05-31 ENCOUNTER — Other Ambulatory Visit: Payer: Self-pay

## 2020-05-31 DIAGNOSIS — G479 Sleep disorder, unspecified: Secondary | ICD-10-CM | POA: Diagnosis not present

## 2020-05-31 DIAGNOSIS — F17200 Nicotine dependence, unspecified, uncomplicated: Secondary | ICD-10-CM | POA: Diagnosis not present

## 2020-05-31 DIAGNOSIS — Z1159 Encounter for screening for other viral diseases: Secondary | ICD-10-CM

## 2020-05-31 DIAGNOSIS — Z2821 Immunization not carried out because of patient refusal: Secondary | ICD-10-CM | POA: Diagnosis not present

## 2020-05-31 DIAGNOSIS — I10 Essential (primary) hypertension: Secondary | ICD-10-CM

## 2020-05-31 DIAGNOSIS — Z114 Encounter for screening for human immunodeficiency virus [HIV]: Secondary | ICD-10-CM

## 2020-05-31 MED ORDER — LISINOPRIL 20 MG PO TABS: 20.0000 mg | ORAL_TABLET | Freq: Every day | ORAL | 6 refills | Status: DC

## 2020-05-31 MED ORDER — METOPROLOL TARTRATE 100 MG PO TABS: 100.0000 mg | ORAL_TABLET | Freq: Two times a day (BID) | ORAL | 6 refills | Status: DC

## 2020-05-31 MED ORDER — TRAZODONE HCL 100 MG PO TABS: 100.0000 mg | ORAL_TABLET | Freq: Every day | ORAL | 5 refills | Status: DC
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
## 2020-06-01 ENCOUNTER — Encounter: Payer: Self-pay | Admitting: Pharmacist

## 2020-06-01 ENCOUNTER — Other Ambulatory Visit: Payer: Self-pay

## 2020-06-01 ENCOUNTER — Ambulatory Visit: Payer: BC Managed Care – PPO | Attending: Internal Medicine

## 2020-06-01 ENCOUNTER — Ambulatory Visit: Payer: BC Managed Care – PPO | Attending: Internal Medicine | Admitting: Pharmacist

## 2020-06-01 VITALS — BP 158/108 | HR 54

## 2020-06-01 DIAGNOSIS — I10 Essential (primary) hypertension: Secondary | ICD-10-CM | POA: Diagnosis not present

## 2020-06-01 DIAGNOSIS — Z114 Encounter for screening for human immunodeficiency virus [HIV]: Secondary | ICD-10-CM

## 2020-06-01 DIAGNOSIS — Z1159 Encounter for screening for other viral diseases: Secondary | ICD-10-CM

## 2020-06-01 MED ORDER — AMLODIPINE BESYLATE 5 MG PO TABS: 5.0000 mg | ORAL_TABLET | Freq: Every day | ORAL | 1 refills | Status: DC

## 2020-06-01 NOTE — Progress Notes (Signed)
     S:    Patient arrives well and in good spirits. Presents to the clinic for hypertension evaluation, counseling, and management. Patient was referred and last seen by his PCP via telephone visit yesterday (05/31/2020). No changes were made at this visit, however, Dr. Laural Benes advised patient to come get BP checked by pharmacy team today.   Today, patient denies dizziness or headaches. He initially reported appropriate adherence to BP medications, however, came back to clinic after our visit together and reported that he actually had not taken his BP medications in weeks.   Medication adherence denied; patient has not been taking medications for past several weeks.  Current BP Medications include:  Metoprolol tartrate 100 mg BID and lisinopril 40 mg daily  Antihypertensives tried in the past include: HCTZ, furosemide  O:  Vitals:   06/01/20 1314  BP: (!) 158/108  Pulse: (!) 54   Does not take blood pressure at home  Last 3 Office BP readings: BP Readings from Last 3 Encounters:  01/26/20 (!) 155/95  10/10/19 (!) 122/96  07/25/18 140/79    BMET    Component Value Date/Time   NA 141 01/26/2020 1353   K 4.3 01/26/2020 1353   CL 103 01/26/2020 1353   CO2 24 01/26/2020 1353   GLUCOSE 86 01/26/2020 1353   GLUCOSE 136 (H) 10/10/2019 0137   BUN 15 01/26/2020 1353   CREATININE 1.25 01/26/2020 1353   CALCIUM 9.2 01/26/2020 1353   GFRNONAA 74 01/26/2020 1353   GFRAA 85 01/26/2020 1353    Renal function: CrCl cannot be calculated (Patient's most recent lab result is older than the maximum 21 days allowed.).  Clinical ASCVD: No  The ASCVD Risk score Denman George DC Jr., et al., 2013) failed to calculate for the following reasons:   The 2013 ASCVD risk score is only valid for ages 41 to 76    A/P: Hypertension currently uncontrolled on current medications. BP Goal = < 130/80 mmHg. Lack of disease control due to medication non-compliance. During office visit today pt reported  medication adherence initially. However, patient returned to clinic after the visit reporting he actually had not been taking his medications over the past several weeks. Of note, he has taken his medications today. Will make no changes and have him return for reassessment in 1 month.  -Continued metoprolol tartrate 100 mg BID -Continued lisinopril 40 mg daily.  -Counseled on lifestyle modifications for blood pressure control including reduced dietary sodium, increased exercise, adequate sleep.  Results reviewed and written information provided.   Total time in face-to-face counseling 15 minutes.   F/U Clinic Visit with clinical pharmacist in one month.  Theodis Sato, PharmD PGY-1 [facility]ident  06/01/2020 1:13 PM  Butch Penny, PharmD, CPP Clinical Pharmacist [facility] [phone]

## 2020-06-02 ENCOUNTER — Telehealth: Payer: Self-pay

## 2020-06-02 LAB — HEPATITIS C ANTIBODY: Hep C Virus Ab: 0.2 s/co ratio (ref 0.0–0.9)

## 2020-06-02 LAB — HIV ANTIBODY (ROUTINE TESTING W REFLEX): HIV Screen 4th Generation wRfx: NONREACTIVE

## 2020-06-02 NOTE — Telephone Encounter (Signed)
Contacted pt to go over lab results pt is aware and doesn't have any questions or concerns 

## 2020-07-01 ENCOUNTER — Ambulatory Visit: Payer: BC Managed Care – PPO | Admitting: Pharmacist

## 2020-07-13 ENCOUNTER — Encounter: Payer: Self-pay | Admitting: Pharmacist

## 2020-07-13 ENCOUNTER — Ambulatory Visit: Payer: BC Managed Care – PPO | Attending: Internal Medicine | Admitting: Pharmacist

## 2020-07-13 ENCOUNTER — Other Ambulatory Visit: Payer: Self-pay

## 2020-07-13 VITALS — BP 145/83

## 2020-07-13 DIAGNOSIS — I10 Essential (primary) hypertension: Secondary | ICD-10-CM

## 2020-07-13 MED ORDER — AMLODIPINE BESYLATE 5 MG PO TABS: 5.0000 mg | ORAL_TABLET | Freq: Every day | ORAL | 1 refills | Status: DC

## 2020-07-13 NOTE — Progress Notes (Signed)
     S:    PCP: Dr. Laural Benes   Patient arrives well and in good spirits. Presents to the clinic for hypertension evaluation, counseling, and management f/u. Patient was referred and last seen by his PCP via telephone visit on 05/31/2020.  Patient saw clinical pharmacist on 11/30 and reported non-adherence with medications therefore original regimen was continued.  Today, patient denies dizziness or headaches. Pt reports headache yesterday so he took his BP which was approximately 150/105. Patient reports adherence with medications in the past month but does not take BP at home.  Current BP Medications include:  Metoprolol tartrate 100 mg BID and lisinopril 20 mg daily  Antihypertensives tried in the past include: HCTZ, furosemide  O:   Vitals:   07/13/20 1036  BP: (!) 145/83   Last 3 Office BP readings: BP Readings from Last 3 Encounters:  07/13/20 (!) 145/83  06/01/20 (!) 158/108  01/26/20 (!) 155/95   BMET    Component Value Date/Time   NA 141 01/26/2020 1353   K 4.3 01/26/2020 1353   CL 103 01/26/2020 1353   CO2 24 01/26/2020 1353   GLUCOSE 86 01/26/2020 1353   GLUCOSE 136 (H) 10/10/2019 0137   BUN 15 01/26/2020 1353   CREATININE 1.25 01/26/2020 1353   CALCIUM 9.2 01/26/2020 1353   GFRNONAA 74 01/26/2020 1353   GFRAA 85 01/26/2020 1353   Renal function: CrCl cannot be calculated (Patient's most recent lab result is older than the maximum 21 days allowed.).  Clinical ASCVD: No  The ASCVD Risk score Denman George DC Jr., et al., 2013) failed to calculate for the following reasons:   The 2013 ASCVD risk score is only valid for ages 75 to 79  A/P: Hypertension currently uncontrolled on current medications. BP Goal = < 130/80 mmHg. Lack of disease control. During office visit today pt reported medication adherence in the past month despite hx of non-adherence. Of note, he has taken his medications today and had a cigarette before visit. Today, will add amlodipine 5 mg and f/u in  1 month.  -Continued metoprolol tartrate 100 mg BID -Continued lisinopril 20 mg daily. - Initiate amlodipine 5 mg daily.  -Counseled on lifestyle modifications for blood pressure control including reduced dietary sodium and reduced caffeine intake.  Results reviewed and written information provided.   Total time in face-to-face counseling 15 minutes.   F/U Clinic Visit with clinical pharmacist in one month.  Hector Brunswick, Student-PharmD   07/13/20 11:02 AM  Theodis Sato, PharmD PGY-1 [facility] Pharm[facility] Resident  07/13/2020 11:02 AM  Butch Penny, PharmD, BCACP, CPP Clinical Pharmacist [facility] [phone]

## 2020-08-13 ENCOUNTER — Ambulatory Visit: Payer: BC Managed Care – PPO | Attending: Internal Medicine | Admitting: Pharmacist

## 2020-08-13 ENCOUNTER — Other Ambulatory Visit: Payer: Self-pay

## 2020-08-13 ENCOUNTER — Encounter: Payer: Self-pay | Admitting: Pharmacist

## 2020-08-13 VITALS — BP 132/76 | HR 58

## 2020-08-13 DIAGNOSIS — I10 Essential (primary) hypertension: Secondary | ICD-10-CM

## 2020-08-13 NOTE — Progress Notes (Signed)
     S:    PCP: Dr. Laural Benes   Patient arrives well and in good spirits. Presents to the clinic for hypertension evaluation, counseling, and management f/u. Patient was referred and last seen by his PCP via telephone visit on 05/31/2020.  Patient saw clinical pharmacist on 07/13/2020. Amlodipine was added at that visit.  Today, patient denies dizziness or headaches. Denies chest pain or dyspnea.   Patient reports adherence with medications.   Current BP Medications include:  Amlodipine 5 mg daily, metoprolol tartrate 100 mg BID, lisinopril 20 mg daily  Antihypertensives tried in the past include: HCTZ, furosemide  O:   Vitals:   08/13/20 0923  BP: 132/76  Pulse: (!) 58   Last 3 Office BP readings: BP Readings from Last 3 Encounters:  08/13/20 132/76  07/13/20 (!) 145/83  06/01/20 (!) 158/108   BMET    Component Value Date/Time   NA 141 01/26/2020 1353   K 4.3 01/26/2020 1353   CL 103 01/26/2020 1353   CO2 24 01/26/2020 1353   GLUCOSE 86 01/26/2020 1353   GLUCOSE 136 (H) 10/10/2019 0137   BUN 15 01/26/2020 1353   CREATININE 1.25 01/26/2020 1353   CALCIUM 9.2 01/26/2020 1353   GFRNONAA 74 01/26/2020 1353   GFRAA 85 01/26/2020 1353   Renal function: CrCl cannot be calculated (Patient's most recent lab result is older than the maximum 21 days allowed.).  Clinical ASCVD: No  The ASCVD Risk score Denman George DC Jr., et al., 2013) failed to calculate for the following reasons:   The 2013 ASCVD risk score is only valid for ages 14 to 5  A/P: Hypertension currently at goal on current medications. BP Goal = < 130/80 mmHg. Medication adherence reported. Of note, his HR is brady today and has been in the past with current metoprolol dose. Recommend dose reduction in the future if appropriate.   -Continue current medication.  -Counseled on lifestyle modifications for blood pressure control including reduced dietary sodium and reduced caffeine intake.  Results reviewed and written  information provided.   Total time in face-to-face counseling 15 minutes.   F/U Clinic Visit with PCP in March.  Butch Penny, PharmD, [place]y [place], CPP Clinical Pharmacist [facility] [phone]

## 2020-08-27 ENCOUNTER — Other Ambulatory Visit: Payer: BC Managed Care – PPO

## 2020-09-28 ENCOUNTER — Ambulatory Visit: Payer: BC Managed Care – PPO | Admitting: Internal Medicine
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
## 2020-11-01 ENCOUNTER — Ambulatory Visit (INDEPENDENT_AMBULATORY_CARE_PROVIDER_SITE_OTHER): Payer: BC Managed Care – PPO | Admitting: Family

## 2020-11-01 ENCOUNTER — Encounter: Payer: Self-pay | Admitting: Family

## 2020-11-01 ENCOUNTER — Other Ambulatory Visit: Payer: Self-pay

## 2020-11-01 VITALS — BP 134/86 | HR 55 | Wt 212.6 lb

## 2020-11-01 DIAGNOSIS — H00011 Hordeolum externum right upper eyelid: Secondary | ICD-10-CM

## 2020-11-01 MED ORDER — BACITRACIN 500 UNIT/GM EX OINT: 1.0000 "application " | TOPICAL_OINTMENT | Freq: Two times a day (BID) | CUTANEOUS | 0 refills | Status: AC

## 2020-11-01 NOTE — Progress Notes (Signed)
Stye present over right eye for 2 weeks-no drainage or pus

## 2020-11-01 NOTE — Patient Instructions (Addendum)
Referral to Ophthalmologist   Over-the-counter Stye Eye Relief may help.  Stye A stye, also known as a hordeolum, is a bump that forms on an eyelid. It may look like a pimple next to the eyelash. A stye can form inside the eyelid (internal stye) or outside the eyelid (external stye). A stye can cause redness, swelling, and pain on the eyelid. Styes are very common. Anyone can get them at any age. They usually occur in just one eye, but you may have more than one in either eye. What are the causes? A stye is caused by an infection. The infection is almost always caused by bacteria called Staphylococcus aureus. This is a common type of bacteria that lives on the skin. An internal stye may result from an infected oil-producing gland inside the eyelid. An external stye may be caused by an infection at the base of the eyelash (hair follicle). What increases the risk? You are more likely to develop a stye if:  You have had a stye before.  You have any of these conditions: ? Diabetes. ? Red, itchy, inflamed eyelids (blepharitis). ? A skin condition such as seborrheic dermatitis or rosacea. ? High fat levels in your blood (lipids). What are the signs or symptoms? The most common symptom of a stye is eyelid pain. Internal styes are more painful than external styes. Other symptoms may include:  Painful swelling of your eyelid.  A scratchy feeling in your eye.  Tearing and redness of your eye.  Pus draining from the stye.   How is this diagnosed? Your health care provider may be able to diagnose a stye just by examining your eye. The health care provider may also check to make sure:  You do not have a fever or other signs of a more serious infection.  The infection has not spread to other parts of your eye or areas around your eye. How is this treated? Most styes will clear up in a few days without treatment or with warm compresses applied to the area. You may need to use antibiotic drops  or ointment to treat an infection. In some cases, if your stye does not heal with routine treatment, your health care provider may drain pus from the stye using a thin blade or needle. This may be done if the stye is large, causing a lot of pain, or affecting your vision. Follow these instructions at home:  Take over-the-counter and prescription medicines only as told by your health care provider. This includes eye drops or ointments.  If you were prescribed an antibiotic medicine, apply or use it as told by your health care provider. Do not stop using the antibiotic even if your condition improves.  Apply a warm, wet cloth (warm compress) to your eye for 5-10 minutes, 4 times a day.  Clean the affected eyelid as directed by your health care provider.  Do not wear contact lenses or eye makeup until your stye has healed.  Do not try to pop or drain the stye.  Do not rub your eye. Contact a health care provider if:  You have chills or a fever.  Your stye does not go away after several days.  Your stye affects your vision.  Your eyeball becomes swollen, red, or painful. Get help right away if:  You have pain when moving your eye around. Summary  A stye is a bump that forms on an eyelid. It may look like a pimple next to the eyelash.  A stye can form inside the eyelid (internal stye) or outside the eyelid (external stye). A stye can cause redness, swelling, and pain on the eyelid.  Your health care provider may be able to diagnose a stye just by examining your eye.  Apply a warm, wet cloth (warm compress) to your eye for 5-10 minutes, 4 times a day. This information is not intended to replace advice given to you by your health care provider. Make sure you discuss any questions you have with your health care provider. Document Revised: 02/26/2020 Document Reviewed: 02/26/2020 Elsevier Patient Education  2021 [employer].

## 2021-05-13 ENCOUNTER — Other Ambulatory Visit: Payer: Self-pay | Admitting: Internal Medicine

## 2021-05-13 DIAGNOSIS — G479 Sleep disorder, unspecified: Secondary | ICD-10-CM

## 2021-05-13 NOTE — Telephone Encounter (Signed)
Requested medications are due for refill today yes  Requested medications are on the active medication list yes  Last refill 04/18/21  Last visit 05/31/20  Future visit scheduled no, NO SHOW 08/2020, and 08/2020  Notes to clinic Failed protocol due to no valid visit within 6  months, please assess. Requested Prescriptions  Pending Prescriptions Disp Refills   traZODone (DESYREL) 100 MG tablet [Pharmacy Med Name: TRAZODONE 100MG  TABLETS] 30 tablet 5    Sig: TAKE 1 TO 2 TABLETS BY MOUTH AT BEDTIME     Psychiatry: Antidepressants - Serotonin Modulator Failed - 05/13/2021  2:50 AM      Failed - Valid encounter within last 6 months    Recent Outpatient Visits           9 months ago Essential hypertension   [facility] [place], KOOMBERKINE, RPH-CPP   10 months ago Essential hypertension   [facility] [place], KOOMBERKINE, RPH-CPP   11 months ago Essential hypertension   [facility] [place], KOOMBERKINE, RPH-CPP   11 months ago Essential hypertension   [facility] Cornelius Moras, MD   1 year ago Essential hypertension   [facility] Marcine Matar, MD

## 2021-06-20 ENCOUNTER — Other Ambulatory Visit: Payer: Self-pay | Admitting: Internal Medicine

## 2021-06-20 DIAGNOSIS — I10 Essential (primary) hypertension: Secondary | ICD-10-CM

## 2021-06-20 NOTE — Telephone Encounter (Signed)
Pt has an appt with Mcclung on 12/22

## 2021-06-23 ENCOUNTER — Ambulatory Visit: Payer: BC Managed Care – PPO | Attending: Physician Assistant | Admitting: Physician Assistant

## 2021-06-23 ENCOUNTER — Other Ambulatory Visit: Payer: Self-pay

## 2021-06-23 VITALS — BP 137/85 | HR 71 | Ht 72.5 in | Wt 211.2 lb

## 2021-06-23 DIAGNOSIS — F17200 Nicotine dependence, unspecified, uncomplicated: Secondary | ICD-10-CM

## 2021-06-23 DIAGNOSIS — Z131 Encounter for screening for diabetes mellitus: Secondary | ICD-10-CM

## 2021-06-23 DIAGNOSIS — J4520 Mild intermittent asthma, uncomplicated: Secondary | ICD-10-CM

## 2021-06-23 DIAGNOSIS — G479 Sleep disorder, unspecified: Secondary | ICD-10-CM

## 2021-06-23 DIAGNOSIS — I10 Essential (primary) hypertension: Secondary | ICD-10-CM

## 2021-06-23 DIAGNOSIS — Z13220 Encounter for screening for lipoid disorders: Secondary | ICD-10-CM

## 2021-06-23 MED ORDER — AMLODIPINE BESYLATE 5 MG PO TABS: 5.0000 mg | ORAL_TABLET | Freq: Every day | ORAL | 1 refills | Status: DC

## 2021-06-23 MED ORDER — TRAZODONE HCL 100 MG PO TABS: 100.0000 mg | ORAL_TABLET | Freq: Every day | ORAL | 5 refills | Status: DC

## 2021-06-23 MED ORDER — LISINOPRIL 20 MG PO TABS: 20.0000 mg | ORAL_TABLET | Freq: Every day | ORAL | 1 refills | Status: DC

## 2021-06-23 MED ORDER — ALBUTEROL SULFATE HFA 108 (90 BASE) MCG/ACT IN AERS: 2.0000 | INHALATION_SPRAY | RESPIRATORY_TRACT | 2 refills | Status: DC | PRN

## 2021-06-23 MED ORDER — METOPROLOL TARTRATE 100 MG PO TABS: 100.0000 mg | ORAL_TABLET | Freq: Two times a day (BID) | ORAL | 1 refills | Status: DC

## 2021-06-23 NOTE — Progress Notes (Signed)
Patient ID: [name], male   DOB: 1981/04/29, 40 y.o.   MRN: [id number]   [name], is a 40 y.o. male  JGG:[id number]  LYY:[id number]  DOB - 08/02/1980  Chief Complaint  Patient presents with   Hypertension       Subjective:   [name] is a 40 y.o. male here today for a follow up visit for BP and insomnia.  No HA/CP/SOB/dizziness  No problems updated.  ALLERGIES: No Known Allergies  PAST MEDICAL HISTORY: Past Medical History:  Diagnosis Date   Asthma    Hypertension    Seizure (HCC)    Smoker    TBI (traumatic brain injury) (HCC)     MEDICATIONS AT HOME: Prior to Admission medications   Medication Sig Start Date End Date Taking? Authorizing Provider  Melatonin 2.5 MG CHEW Use as directed for as needed for sleep. 05/10/17  Yes Hairston, Leonia Reeves R, FNP  naproxen (NAPROSYN) 500 MG tablet TAKE 1 TABLET(500 MG) BY MOUTH TWICE DAILY WITH A MEAL 04/28/19  Yes Marcine Matar, MD  polycarbophil (FIBERCON) 625 MG tablet Take 1 tablet (625 mg total) by mouth daily. 09/20/16  Yes Angiulli, Mcarthur Rossetti, PA-C  albuterol (VENTOLIN HFA) 108 (90 Base) MCG/ACT inhaler Inhale 2 puffs into the lungs every 4 (four) hours as needed for wheezing or shortness of breath. 06/23/21   Anders Simmonds, PA-C  amLODipine (NORVASC) 5 MG tablet Take 1 tablet (5 mg total) by mouth daily. 06/23/21   Anders Simmonds, PA-C  lisinopril (ZESTRIL) 20 MG tablet Take 1 tablet (20 mg total) by mouth daily. 06/23/21   Anders Simmonds, PA-C  metoprolol tartrate (LOPRESSOR) 100 MG tablet Take 1 tablet (100 mg total) by mouth 2 (two) times daily. 06/23/21   Anders Simmonds, PA-C  traZODone (DESYREL) 100 MG tablet Take 1 tablet (100 mg total) by mouth at bedtime. TAKE 1 TO 2 TABLETS(100 TO 200 MG) BY MOUTH AT BEDTIME 06/23/21   Janashia Parco M, PA-C    ROS: Neg HEENT Neg resp Neg cardiac Neg GI Neg GU Neg MS Neg psych Neg neuro  Objective:   Vitals:   06/23/21 1051  BP: 137/85   Pulse: 71  SpO2: 99%  Weight: 211 lb 3.2 oz (95.8 kg)  Height: 6' 0.5" (1.842 m)   Exam General appearance : Awake, alert, not in any distress. Speech Clear. Not toxic looking HEENT: Atraumatic and Normocephalic Neck: Supple, no JVD. No cervical lymphadenopathy.  Chest: Good air entry bilaterally, CTAB.  No rales/rhonchi/wheezing CVS: S1 S2 regular, no murmurs.  Extremities: B/L Lower Ext shows no edema, both legs are warm to touch Neurology: Awake alert, and oriented X 3, CN II-XII intact, Non focal Skin: No Rash  Data Review No results found for: HGBA1C  Assessment & Plan   1. Essential hypertension Suboptimal but adequate control.  Smoking cessation would help-.  We have discussed target BP range and blood pressure goal. I have advised patient to check BP regularly and to call us back or report to clinic if the numbers are consistently higher than 140/90. We discussed the importance of compliance with medical therapy and DASH diet recommended, consequences of uncontrolled hypertension discussed.  - lisinopril (ZESTRIL) 20 MG tablet; Take 1 tablet (20 mg total) by mouth daily.  Dispense: 90 tablet; Refill: 1 - metoprolol tartrate (LOPRESSOR) 100 MG tablet; Take 1 tablet (100 mg total) by mouth 2 (two) times daily.  Dispense: 180 tablet; Refill: 1 - amLODipine (NORVASC) 5  MG tablet; Take 1 tablet (5 mg total) by mouth daily.  Dispense: 90 tablet; Refill: 1 - Comprehensive metabolic panel - CBC with Differential/Platelet  2. Sleep disturbance Sleep hygiene discussed - traZODone (DESYREL) 100 MG tablet; Take 1 tablet (100 mg total) by mouth at bedtime. TAKE 1 TO 2 TABLETS(100 TO 200 MG) BY MOUTH AT BEDTIME  Dispense: 45 tablet; Refill: 5  3. Mild intermittent asthma without complication - albuterol (VENTOLIN HFA) 108 (90 Base) MCG/ACT inhaler; Inhale 2 puffs into the lungs every 4 (four) hours as needed for wheezing or shortness of breath.  Dispense: 1 each; Refill: 2  4. Tobacco  dependence Smoking and dangers of nicotine have been discussed at length. Long term health consequences of smoking reviewed in detail.  Methods for helping with cessation have been reviewed.  Patient expresses understanding.   5. Screening cholesterol level - Lipid panel  6. Screening for diabetes mellitus I have had a lengthy discussion and provided education about insulin resistance and the intake of too much sugar/refined carbohydrates.  I have advised the patient to work at a goal of eliminating sugary drinks, candy, desserts, sweets, refined sugars, processed foods, and white carbohydrates.  The patient expresses understanding.   - Hemoglobin A1c    Patient have been counseled extensively about nutrition and exercise. Other issues discussed during this visit include: low cholesterol diet, weight control and daily exercise, foot care, annual eye examinations at Ophthalmology, importance of adherence with medications and regular follow-up. We also discussed long term complications of uncontrolled diabetes and hypertension.   Return in about 6 months (around 12/22/2021) for with PCP-chronic health conditions.  The patient was given clear instructions to go to ER or return to medical center if symptoms don't improve, worsen or new problems develop. The patient verbalized understanding. The patient was told to call to get lab results if they haven't heard anything in the next week.      Georgian Co, PA-C [facility] [place], [place] [phone]   06/23/2021, 11:07 AM

## 2021-06-24 LAB — LIPID PANEL
Chol/HDL Ratio: 3 ratio (ref 0.0–5.0)
Cholesterol, Total: 156 mg/dL (ref 100–199)
HDL: 52 mg/dL (ref >39)
LDL Chol Calc (NIH): 92 mg/dL (ref 0–99)
Triglycerides: 58 mg/dL (ref 0–149)
VLDL Cholesterol Cal: 12 mg/dL (ref 5–40)

## 2021-06-24 LAB — CBC WITH DIFFERENTIAL/PLATELET
Basophils Absolute: 0 10*3/uL (ref 0.0–0.2)
Basos: 1 %
EOS (ABSOLUTE): 0.1 10*3/uL (ref 0.0–0.4)
Eos: 2 %
Hematocrit: 39.9 % (ref 37.5–51.0)
Hemoglobin: 13.6 g/dL (ref 13.0–17.7)
Immature Grans (Abs): 0 10*3/uL (ref 0.0–0.1)
Immature Granulocytes: 1 %
Lymphocytes Absolute: 1.5 10*3/uL (ref 0.7–3.1)
Lymphs: 23 %
MCH: 33.3 pg — ABNORMAL HIGH (ref 26.6–33.0)
MCHC: 34.1 g/dL (ref 31.5–35.7)
MCV: 98 fL — ABNORMAL HIGH (ref 79–97)
Monocytes Absolute: 0.7 10*3/uL (ref 0.1–0.9)
Monocytes: 11 %
Neutrophils Absolute: 4 10*3/uL (ref 1.4–7.0)
Neutrophils: 62 %
Platelets: 250 10*3/uL (ref 150–450)
RBC: 4.08 x10E6/uL — ABNORMAL LOW (ref 4.14–5.80)
RDW: 11.8 % (ref 11.6–15.4)
WBC: 6.4 10*3/uL (ref 3.4–10.8)

## 2021-06-24 LAB — HEMOGLOBIN A1C
Est. average glucose Bld gHb Est-mCnc: 94 mg/dL
Hgb A1c MFr Bld: 4.9 % (ref 4.8–5.6)

## 2021-06-24 LAB — COMPREHENSIVE METABOLIC PANEL
ALT: 38 IU/L (ref 0–44)
AST: 33 IU/L (ref 0–40)
Albumin/Globulin Ratio: 2.1 (ref 1.2–2.2)
Albumin: 4.6 g/dL (ref 4.0–5.0)
Alkaline Phosphatase: 90 IU/L (ref 44–121)
BUN/Creatinine Ratio: 13 (ref 9–20)
BUN: 15 mg/dL (ref 6–20)
Bilirubin Total: 0.7 mg/dL (ref 0.0–1.2)
CO2: 24 mmol/L (ref 20–29)
Calcium: 9.3 mg/dL (ref 8.7–10.2)
Chloride: 105 mmol/L (ref 96–106)
Creatinine, Ser: 1.17 mg/dL (ref 0.76–1.27)
Globulin, Total: 2.2 g/dL (ref 1.5–4.5)
Glucose: 103 mg/dL — ABNORMAL HIGH (ref 70–99)
Potassium: 4.5 mmol/L (ref 3.5–5.2)
Sodium: 144 mmol/L (ref 134–144)
Total Protein: 6.8 g/dL (ref 6.0–8.5)
eGFR: 82 mL/min/{1.73_m2} (ref >59)

## 2022-02-13 ENCOUNTER — Other Ambulatory Visit: Payer: Self-pay | Admitting: Physician Assistant

## 2022-02-13 DIAGNOSIS — I10 Essential (primary) hypertension: Secondary | ICD-10-CM

## 2022-07-24 ENCOUNTER — Other Ambulatory Visit: Payer: Self-pay | Admitting: Physician Assistant

## 2022-07-24 DIAGNOSIS — G479 Sleep disorder, unspecified: Secondary | ICD-10-CM

## 2022-07-25 NOTE — Telephone Encounter (Signed)
Requested medications are due for refill today.  unsure  Requested medications are on the active medications list.  yes  Last refill. 06/23/2021 #45 5 rf  Future visit scheduled.   no  Notes to clinic.  Pt is more than 3 months overdue for ov.    Requested Prescriptions  Pending Prescriptions Disp Refills   traZODone (DESYREL) 100 MG tablet [Pharmacy Med Name: TRAZODONE 100MG  TABLETS] 45 tablet 5    Sig: TAKE 1-2 TABLETS BY MOUTH AT BEDTIME     Psychiatry: Antidepressants - Serotonin Modulator Failed - 07/24/2022  2:28 PM      Failed - Valid encounter within last 6 months    Recent Outpatient Visits           1 year ago Tobacco dependence   [place], [place]   1 year ago Hordeolum externum of right upper eyelid    [facility], [place], NP   1 year ago Essential hypertension   [place], RPH-CPP   2 years ago Essential hypertension   [place], RPH-CPP   2 years ago Essential hypertension   [place], [place]

## 2022-08-05 ENCOUNTER — Other Ambulatory Visit: Payer: Self-pay | Admitting: Physician Assistant

## 2022-08-05 DIAGNOSIS — G479 Sleep disorder, unspecified: Secondary | ICD-10-CM

## 2022-08-07 NOTE — Telephone Encounter (Signed)
Called patient to schedule appt for medication refills. Scheduled appt for 09/07/22 for physical.

## 2022-08-07 NOTE — Telephone Encounter (Signed)
Requested medication (s) are due for refill today: expired medication  Requested medication (s) are on the active medication list: yes   Last refill:  06/23/21 #45 5 refills  Future visit scheduled: yes in 1 month   Notes to clinic:  expired medication. Do you want to renew Rx?      Requested Prescriptions  Pending Prescriptions Disp Refills   traZODone (DESYREL) 100 MG tablet [Pharmacy Med Name: TRAZODONE 100MG  TABLETS] 45 tablet 5    Sig: TAKE 1-2 TABLETS BY MOUTH AT BEDTIME     Psychiatry: Antidepressants - Serotonin Modulator Failed - 08/05/2022 11:52 AM      Failed - Valid encounter within last 6 months    Recent Outpatient Visits           1 year ago Tobacco dependence   [place], [place], [place]   1 year ago Hordeolum externum of right upper eyelid    [facility] at [facility], [place], NP   1 year ago Essential hypertension   [place], RPH-CPP   2 years ago Essential hypertension   [place], [place], RPH-CPP   2 years ago Essential hypertension   [place], [place], RPH-CPP       Future Appointments             In 1 month Wynetta Emery, Dalbert Batman, MD [place]

## 2022-09-07 ENCOUNTER — Ambulatory Visit: Payer: BC Managed Care – PPO | Attending: Internal Medicine | Admitting: Internal Medicine

## 2022-09-07 ENCOUNTER — Encounter: Payer: Self-pay | Admitting: Internal Medicine

## 2022-09-07 VITALS — BP 142/80 | HR 78 | Temp 98.4°F | Ht 72.0 in | Wt 223.0 lb

## 2022-09-07 DIAGNOSIS — Z87898 Personal history of other specified conditions: Secondary | ICD-10-CM | POA: Diagnosis not present

## 2022-09-07 DIAGNOSIS — F17200 Nicotine dependence, unspecified, uncomplicated: Secondary | ICD-10-CM

## 2022-09-07 DIAGNOSIS — I10 Essential (primary) hypertension: Secondary | ICD-10-CM | POA: Diagnosis not present

## 2022-09-07 DIAGNOSIS — Z1331 Encounter for screening for depression: Secondary | ICD-10-CM

## 2022-09-07 DIAGNOSIS — G479 Sleep disorder, unspecified: Secondary | ICD-10-CM

## 2022-09-07 DIAGNOSIS — E669 Obesity, unspecified: Secondary | ICD-10-CM

## 2022-09-07 MED ORDER — TRAZODONE HCL 100 MG PO TABS: 100.0000 mg | ORAL_TABLET | Freq: Every day | ORAL | 1 refills | Status: DC

## 2022-09-07 MED ORDER — AMLODIPINE BESYLATE 5 MG PO TABS: 5.0000 mg | ORAL_TABLET | Freq: Every day | ORAL | 1 refills | Status: DC
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
## 2022-09-08 LAB — COMPREHENSIVE METABOLIC PANEL
ALT: 29 IU/L (ref 0–44)
AST: 22 IU/L (ref 0–40)
Albumin/Globulin Ratio: 1.8 (ref 1.2–2.2)
Albumin: 3.9 g/dL — ABNORMAL LOW (ref 4.1–5.1)
Alkaline Phosphatase: 88 IU/L (ref 44–121)
BUN/Creatinine Ratio: 8 — ABNORMAL LOW (ref 9–20)
BUN: 10 mg/dL (ref 6–20)
Bilirubin Total: 0.3 mg/dL (ref 0.0–1.2)
CO2: 22 mmol/L (ref 20–29)
Calcium: 8.9 mg/dL (ref 8.7–10.2)
Chloride: 105 mmol/L (ref 96–106)
Creatinine, Ser: 1.21 mg/dL (ref 0.76–1.27)
Globulin, Total: 2.2 g/dL (ref 1.5–4.5)
Glucose: 91 mg/dL (ref 70–99)
Potassium: 4.6 mmol/L (ref 3.5–5.2)
Sodium: 141 mmol/L (ref 134–144)
Total Protein: 6.1 g/dL (ref 6.0–8.5)
eGFR: 78 mL/min/{1.73_m2} (ref >59)

## 2022-09-08 LAB — CBC
Hematocrit: 45.3 % (ref 37.5–51.0)
Hemoglobin: 15.3 g/dL (ref 13.0–17.7)
MCH: 33 pg (ref 26.6–33.0)
MCHC: 33.8 g/dL (ref 31.5–35.7)
MCV: 98 fL — ABNORMAL HIGH (ref 79–97)
Platelets: 220 10*3/uL (ref 150–450)
RBC: 4.63 x10E6/uL (ref 4.14–5.80)
RDW: 12 % (ref 11.6–15.4)
WBC: 7.6 10*3/uL (ref 3.4–10.8)

## 2022-09-08 LAB — LIPID PANEL
Chol/HDL Ratio: 3.6 ratio (ref 0.0–5.0)
Cholesterol, Total: 159 mg/dL (ref 100–199)
HDL: 44 mg/dL (ref >39)
LDL Chol Calc (NIH): 94 mg/dL (ref 0–99)
Triglycerides: 117 mg/dL (ref 0–149)
VLDL Cholesterol Cal: 21 mg/dL (ref 5–40)

## 2023-03-09 ENCOUNTER — Encounter: Payer: Self-pay | Admitting: Internal Medicine

## 2023-03-09 ENCOUNTER — Ambulatory Visit: Payer: BC Managed Care – PPO | Attending: Internal Medicine | Admitting: Internal Medicine

## 2023-03-09 VITALS — BP 126/80 | HR 71 | Temp 98.3°F | Ht 72.0 in | Wt 218.0 lb

## 2023-03-09 DIAGNOSIS — J4520 Mild intermittent asthma, uncomplicated: Secondary | ICD-10-CM

## 2023-03-09 DIAGNOSIS — G479 Sleep disorder, unspecified: Secondary | ICD-10-CM | POA: Diagnosis not present

## 2023-03-09 DIAGNOSIS — Z2821 Immunization not carried out because of patient refusal: Secondary | ICD-10-CM

## 2023-03-09 DIAGNOSIS — I10 Essential (primary) hypertension: Secondary | ICD-10-CM | POA: Diagnosis not present

## 2023-03-09 MED ORDER — TRAZODONE HCL 100 MG PO TABS: 100.0000 mg | ORAL_TABLET | Freq: Every day | ORAL | 1 refills | Status: DC

## 2023-03-09 MED ORDER — AMLODIPINE BESYLATE 5 MG PO TABS: 5.0000 mg | ORAL_TABLET | Freq: Every day | ORAL | 1 refills | Status: DC

## 2023-03-09 MED ORDER — ALBUTEROL SULFATE HFA 108 (90 BASE) MCG/ACT IN AERS: 2.0000 | INHALATION_SPRAY | RESPIRATORY_TRACT | 2 refills | Status: AC | PRN
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
## 2023-09-06 ENCOUNTER — Ambulatory Visit: Payer: BC Managed Care – PPO | Attending: Internal Medicine | Admitting: Internal Medicine

## 2023-09-06 ENCOUNTER — Encounter: Payer: Self-pay | Admitting: Internal Medicine

## 2023-09-06 VITALS — BP 129/81 | HR 72 | Temp 98.3°F | Ht 72.0 in | Wt 231.0 lb

## 2023-09-06 DIAGNOSIS — M545 Low back pain, unspecified: Secondary | ICD-10-CM | POA: Diagnosis not present

## 2023-09-06 DIAGNOSIS — I10 Essential (primary) hypertension: Secondary | ICD-10-CM | POA: Diagnosis not present

## 2023-09-06 DIAGNOSIS — Z6831 Body mass index (BMI) 31.0-31.9, adult: Secondary | ICD-10-CM

## 2023-09-06 DIAGNOSIS — F17210 Nicotine dependence, cigarettes, uncomplicated: Secondary | ICD-10-CM

## 2023-09-06 DIAGNOSIS — G479 Sleep disorder, unspecified: Secondary | ICD-10-CM

## 2023-09-06 DIAGNOSIS — Z2821 Immunization not carried out because of patient refusal: Secondary | ICD-10-CM

## 2023-09-06 DIAGNOSIS — E669 Obesity, unspecified: Secondary | ICD-10-CM | POA: Diagnosis not present

## 2023-09-06 DIAGNOSIS — E66811 Obesity, class 1: Secondary | ICD-10-CM

## 2023-09-06 DIAGNOSIS — F17200 Nicotine dependence, unspecified, uncomplicated: Secondary | ICD-10-CM

## 2023-09-06 MED ORDER — TRAZODONE HCL 100 MG PO TABS: 100.0000 mg | ORAL_TABLET | Freq: Every day | ORAL | 1 refills | Status: AC

## 2023-09-06 MED ORDER — AMLODIPINE BESYLATE 5 MG PO TABS: 5.0000 mg | ORAL_TABLET | Freq: Every day | ORAL | 1 refills | Status: AC
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
## 2023-09-07 LAB — COMPREHENSIVE METABOLIC PANEL
ALT: 54 IU/L — ABNORMAL HIGH (ref 0–44)
AST: 30 IU/L (ref 0–40)
Albumin: 3.7 g/dL — ABNORMAL LOW (ref 4.1–5.1)
Alkaline Phosphatase: 78 IU/L (ref 44–121)
BUN/Creatinine Ratio: 12 (ref 9–20)
BUN: 13 mg/dL (ref 6–24)
Bilirubin Total: 0.3 mg/dL (ref 0.0–1.2)
CO2: 21 mmol/L (ref 20–29)
Calcium: 9.2 mg/dL (ref 8.7–10.2)
Chloride: 107 mmol/L — ABNORMAL HIGH (ref 96–106)
Creatinine, Ser: 1.1 mg/dL (ref 0.76–1.27)
Globulin, Total: 2.2 g/dL (ref 1.5–4.5)
Glucose: 87 mg/dL (ref 70–99)
Potassium: 5.1 mmol/L (ref 3.5–5.2)
Sodium: 142 mmol/L (ref 134–144)
Total Protein: 5.9 g/dL — ABNORMAL LOW (ref 6.0–8.5)
eGFR: 87 mL/min/{1.73_m2} (ref >59)

## 2023-09-07 LAB — LIPID PANEL
Chol/HDL Ratio: 4.5 ratio (ref 0.0–5.0)
Cholesterol, Total: 183 mg/dL (ref 100–199)
HDL: 41 mg/dL (ref >39)
LDL Chol Calc (NIH): 102 mg/dL — ABNORMAL HIGH (ref 0–99)
Triglycerides: 233 mg/dL — ABNORMAL HIGH (ref 0–149)
VLDL Cholesterol Cal: 40 mg/dL (ref 5–40)

## 2023-09-07 LAB — CBC
Hematocrit: 44.1 % (ref 37.5–51.0)
Hemoglobin: 14.9 g/dL (ref 13.0–17.7)
MCH: 33.5 pg — ABNORMAL HIGH (ref 26.6–33.0)
MCHC: 33.8 g/dL (ref 31.5–35.7)
MCV: 99 fL — ABNORMAL HIGH (ref 79–97)
Platelets: 250 10*3/uL (ref 150–450)
RBC: 4.45 x10E6/uL (ref 4.14–5.80)
RDW: 11.9 % (ref 11.6–15.4)
WBC: 10.2 10*3/uL (ref 3.4–10.8)

## 2023-09-07 NOTE — Progress Notes (Signed)
 Blood cell counts are normal. Kidney function is good.  Slight elevation in one of his liver function test which we will observe for now and plan to repeat on subsequent visit. Triglyceride level and LDL cholesterol levels elevated.  LDL is the bad cholesterol that can increase risk for heart attack and strokes.  Healthy eating habits and getting back to his regular exercise can help lower cholesterol.  I would also recommend referral to nutritionist for dietary counseling.  Let me know if he is willing to see the nutritionist.  Lets plan to recheck cholesterol level on subsequent visit.  If still elevated, we will need to discuss starting cholesterol-lowering medication.  The rest of this is for my information. The 10-year ASCVD risk score (Arnett DK, et al., 2019) is: 9.2%   Values used to calculate the score:     Age: 43 years     Sex: Male     Is Non-Hispanic African American: Yes     Diabetic: No     Tobacco smoker: Yes     Systolic Blood Pressure: 129 mmHg     Is BP treated: Yes     HDL Cholesterol: 41 mg/dL     Total Cholesterol: 183 mg/dL

## 2023-09-17 ENCOUNTER — Other Ambulatory Visit: Payer: Self-pay

## 2023-09-17 ENCOUNTER — Encounter (HOSPITAL_COMMUNITY): Payer: Self-pay | Admitting: Emergency Medicine

## 2023-09-17 ENCOUNTER — Emergency Department (HOSPITAL_COMMUNITY)
Admission: EM | Admit: 2023-09-17 | Discharge: 2023-09-18 | Disposition: A | Discharge status: Home / Self Care | Attending: Emergency Medicine | Admitting: Emergency Medicine

## 2023-09-17 DIAGNOSIS — I10 Essential (primary) hypertension: Secondary | ICD-10-CM | POA: Insufficient documentation

## 2023-09-17 DIAGNOSIS — J101 Influenza due to other identified influenza virus with other respiratory manifestations: Secondary | ICD-10-CM | POA: Diagnosis not present

## 2023-09-17 DIAGNOSIS — R030 Elevated blood-pressure reading, without diagnosis of hypertension: Secondary | ICD-10-CM

## 2023-09-17 MED ORDER — ACETAMINOPHEN 325 MG PO TABS
650.0000 mg | ORAL_TABLET | Freq: Once | ORAL | Status: AC
  Administered 2023-09-18: 650 mg via ORAL
  Filled 2023-09-17: qty 2

## 2023-09-17 NOTE — ED Triage Notes (Signed)
 Patient reports having headache today and checking BP at home and it was 208/135. Compliant with blood pressure medications. Denies chest pain. No weakness.

## 2023-09-18 LAB — COMPREHENSIVE METABOLIC PANEL
ALT: 54 U/L — ABNORMAL HIGH (ref 0–44)
AST: 36 U/L (ref 15–41)
Albumin: 3.3 g/dL — ABNORMAL LOW (ref 3.5–5.0)
Alkaline Phosphatase: 54 U/L (ref 38–126)
Anion gap: 10 (ref 5–15)
BUN: 10 mg/dL (ref 6–20)
CO2: 22 mmol/L (ref 22–32)
Calcium: 8.4 mg/dL — ABNORMAL LOW (ref 8.9–10.3)
Chloride: 102 mmol/L (ref 98–111)
Creatinine, Ser: 1.26 mg/dL — ABNORMAL HIGH (ref 0.61–1.24)
GFR, Estimated: >60 mL/min (ref >60)
Glucose, Bld: 130 mg/dL — ABNORMAL HIGH (ref 70–99)
Potassium: 3.9 mmol/L (ref 3.5–5.1)
Sodium: 134 mmol/L — ABNORMAL LOW (ref 135–145)
Total Bilirubin: 0.7 mg/dL (ref 0.0–1.2)
Total Protein: 5.8 g/dL — ABNORMAL LOW (ref 6.5–8.1)

## 2023-09-18 LAB — CBC
HCT: 44.8 % (ref 39.0–52.0)
Hemoglobin: 14.9 g/dL (ref 13.0–17.0)
MCH: 32.6 pg (ref 26.0–34.0)
MCHC: 33.3 g/dL (ref 30.0–36.0)
MCV: 98 fL (ref 80.0–100.0)
Platelets: 212 10*3/uL (ref 150–400)
RBC: 4.57 MIL/uL (ref 4.22–5.81)
RDW: 12.6 % (ref 11.5–15.5)
WBC: 8.8 10*3/uL (ref 4.0–10.5)
nRBC: 0 % (ref 0.0–0.2)

## 2023-09-18 LAB — RESP PANEL BY RT-PCR (RSV, FLU A&B, COVID)  RVPGX2
Influenza A by PCR: POSITIVE — AB
Influenza B by PCR: NEGATIVE
Resp Syncytial Virus by PCR: NEGATIVE
SARS Coronavirus 2 by RT PCR: NEGATIVE

## 2023-09-18 MED ORDER — BENZONATATE 100 MG PO CAPS: 100.0000 mg | ORAL_CAPSULE | Freq: Two times a day (BID) | ORAL | 0 refills | Status: AC | PRN

## 2023-09-18 MED ORDER — OSELTAMIVIR PHOSPHATE 75 MG PO CAPS: 75.0000 mg | ORAL_CAPSULE | Freq: Two times a day (BID) | ORAL | 0 refills | Status: AC

## 2023-09-18 NOTE — ED Provider Notes (Signed)
 [facility] Provider Note MRN:  [id number]  Arrival date & time: 09/18/23     Chief Complaint   Hypertension   History of Present Illness   [name] is a 43 y.o. year-old male presents to the ED with chief complaint of elevated blood pressure.  He states that he noticed his BP was high yesterday.  States that he had an associated headache and came to the ER for further evaluation.  He states that he takes BP meds as directed.  He reports some associated cough. Denies fevers, chills, or body aches, but is noted to have fever in triage.  History provided by patient.   Review of Systems  Pertinent positive and negative review of systems noted in HPI.    Physical Exam   Vitals:   09/18/23 0018 09/18/23 0501  BP: (!) 145/91 (!) 145/91  Pulse: 89 79  Resp: 20 19  Temp: (!) 100.4 F (38 C) 99.1 F (37.3 C)  SpO2: 98% 98%    CONSTITUTIONAL:  non toxic-appearing, NAD NEURO:  Alert and oriented x 3, CN 3-12 grossly intact EYES:  eyes equal and reactive ENT/NECK:  Supple, no stridor  CARDIO:  normal rate, regular rhythm, appears well-perfused  PULM:  No respiratory distress, CTAB GI/GU:  non-distended,  MSK/SPINE:  No gross deformities, no edema, moves all extremities  SKIN:  no rash, atraumatic   *Additional and/or pertinent findings included in MDM below  Diagnostic and Interventional Summary    EKG Interpretation Date/Time:  Monday September 17 2023 23:39:40 EDT Ventricular Rate:  100 PR Interval:  150 QRS Duration:  74 QT Interval:  324 QTC Calculation: 417 R Axis:   84  Text Interpretation: Normal sinus rhythm Right atrial enlargement Borderline ECG When compared with ECG of 10-Feb-2017 22:08, No significant change was found Confirmed by Dione Booze ([id number]) on 09/18/2023 2:47:53 AM       Labs Reviewed  RESP PANEL BY RT-PCR (RSV, FLU A&B, COVID)  RVPGX2 - Abnormal; Notable for the following components:      Result  Value   Influenza A by PCR POSITIVE (*)    All other components within normal limits  COMPREHENSIVE METABOLIC PANEL - Abnormal; Notable for the following components:   Sodium 134 (*)    Glucose, Bld 130 (*)    Creatinine, Ser 1.26 (*)    Calcium 8.4 (*)    Total Protein 5.8 (*)    Albumin 3.3 (*)    ALT 54 (*)    All other components within normal limits  CBC    No orders to display    Medications  acetaminophen (TYLENOL) tablet 650 mg (650 mg Oral Given 09/18/23 0005)     Procedures  /  Critical Care Procedures  ED Course and Medical Decision Making  I have reviewed the triage vital signs, the nursing notes, and pertinent available records from the EMR.  Social Determinants Affecting Complexity of Care: Patient has no clinically significant social determinants affecting this chief complaint..   ED Course: Clinical Course as of 09/18/23 0608  Tue Sep 18, 2023  0606 Resp panel by RT-PCR (RSV, Flu A&B, Covid) Anterior Nasal Swab(!) Positive for Flu A.  Will treat with Tamiflu since he is within the treatment window. [RB]  0606 Temp(!): 100.8 F (38.2 C) Thought 2/2 to Flu A. [RB]    Clinical Course User Index [RB] Roxy Horseman, PA-C    Medical Decision Making Patient here with headache, cough, fever,  and elevated BP.  Flu A positive.  Will treat.  Patient concerned about BP.  Recommend that he have this rechecked by PCP after he recovers from influenza.  Amount and/or Complexity of Data Reviewed Labs: ordered. Decision-making details documented in ED Course.  Risk OTC drugs. Prescription drug management.         Consultants: No consultations were needed in caring for this patient.   Treatment and Plan: Emergency department workup does not suggest an emergent condition requiring admission or immediate intervention beyond  what has been performed at this time. The patient is safe for discharge and has  been instructed to return immediately for worsening  symptoms, change in  symptoms or any other concerns    Final Clinical Impressions(s) / ED Diagnoses     ICD-10-CM   1. Influenza A  J10.1     2. Elevated blood pressure reading  R03.0       ED Discharge Orders          Ordered    oseltamivir (TAMIFLU) 75 MG capsule  Every 12 hours        09/18/23 0605    benzonatate (TESSALON) 100 MG capsule  2 times daily PRN        09/18/23 0605              Discharge Instructions Discussed with and Provided to Patient:     Discharge Instructions      You are positive for influenza A.  This likely is causing the change in blood pressure.  Once you recover, recheck your blood pressure and if it remains high, follow-up with your doctor.       Roxy Horseman, PA-C 09/18/23 1610    Sabas Sous, MD 09/18/23 [phone]

## 2023-09-18 NOTE — Discharge Instructions (Signed)
 You are positive for influenza A.  This likely is causing the change in blood pressure.  Once you recover, recheck your blood pressure and if it remains high, follow-up with your doctor.

## 2024-03-07 ENCOUNTER — Telehealth: Payer: Self-pay | Admitting: Internal Medicine

## 2024-03-07 NOTE — Telephone Encounter (Signed)
 Called patient, no answer. Left voicemail confirming upcoming appointment on 03/10/2024 Provided callback number for any questions or changes.

## 2024-03-10 ENCOUNTER — Ambulatory Visit: Admitting: Internal Medicine

## 2024-03-10 ENCOUNTER — Telehealth: Payer: Self-pay | Admitting: Internal Medicine

## 2024-03-10 NOTE — Telephone Encounter (Signed)
 Contacted pt left vm to resch appt Dr.Johnson not in the office

## 2024-04-17 ENCOUNTER — Ambulatory Visit: Admitting: Internal Medicine

## 2024-04-17 NOTE — Progress Notes (Deleted)
 error
